# Patient Record
Sex: Female | Born: 1985 | Race: Black or African American | Hispanic: No | Marital: Single | Smoking: Never smoker
Health system: Southern US, Community
[De-identification: ages and names within clinical notes are randomized; demographics above are authoritative.]

## PROBLEM LIST (undated history)

## (undated) ENCOUNTER — Inpatient Hospital Stay (HOSPITAL_COMMUNITY): Payer: Self-pay

## (undated) DIAGNOSIS — D219 Benign neoplasm of connective and other soft tissue, unspecified: Secondary | ICD-10-CM

## (undated) DIAGNOSIS — R51 Headache: Secondary | ICD-10-CM

## (undated) DIAGNOSIS — J45909 Unspecified asthma, uncomplicated: Secondary | ICD-10-CM

## (undated) DIAGNOSIS — T8859XA Other complications of anesthesia, initial encounter: Secondary | ICD-10-CM

## (undated) DIAGNOSIS — F410 Panic disorder [episodic paroxysmal anxiety] without agoraphobia: Secondary | ICD-10-CM

## (undated) DIAGNOSIS — R519 Headache, unspecified: Secondary | ICD-10-CM

## (undated) DIAGNOSIS — O24419 Gestational diabetes mellitus in pregnancy, unspecified control: Secondary | ICD-10-CM

## (undated) HISTORY — DX: Headache: R51

## (undated) HISTORY — DX: Benign neoplasm of connective and other soft tissue, unspecified: D21.9

## (undated) HISTORY — DX: Gestational diabetes mellitus in pregnancy, unspecified control: O24.419

## (undated) HISTORY — PX: OVARY SURGERY: SHX727

## (undated) HISTORY — DX: Headache, unspecified: R51.9

## (undated) HISTORY — PX: OTHER SURGICAL HISTORY: SHX169

## (undated) HISTORY — DX: Other complications of anesthesia, initial encounter: T88.59XA

---
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
## 2005-03-28 ENCOUNTER — Ambulatory Visit: Payer: Self-pay | Admitting: Family Medicine

## 2007-03-15 ENCOUNTER — Emergency Department (HOSPITAL_COMMUNITY): Admission: EM | Admit: 2007-03-15 | Discharge: 2007-03-16 | Payer: Self-pay | Admitting: Emergency Medicine

## 2017-04-28 ENCOUNTER — Ambulatory Visit (HOSPITAL_COMMUNITY)
Admission: EM | Admit: 2017-04-28 | Discharge: 2017-04-28 | Disposition: A | Discharge status: Home / Self Care | Payer: 59 | Attending: Family Medicine | Admitting: Family Medicine

## 2017-04-28 ENCOUNTER — Encounter (HOSPITAL_COMMUNITY): Payer: Self-pay | Admitting: Emergency Medicine

## 2017-04-28 DIAGNOSIS — J40 Bronchitis, not specified as acute or chronic: Secondary | ICD-10-CM

## 2017-04-28 HISTORY — DX: Unspecified asthma, uncomplicated: J45.909

## 2017-04-28 MED ORDER — PREDNISONE 20 MG PO TABS: ORAL_TABLET | ORAL | 0 refills | Status: DC

## 2017-04-28 NOTE — Discharge Instructions (Signed)
You have bronchitis along with this viral infection.  If not improving in 3 days, please return

## 2017-04-28 NOTE — ED Provider Notes (Signed)
[facility]    CSN: [id number] Arrival date & time: 04/28/17  1318     History   Chief Complaint Chief Complaint  Patient presents with  . URI    HPI Sandra Krueger is a 31 y.o. female.   Pt reports URI symptoms x1 week.  In the last two days she has been having asthma issues, using her inhaler and nebulizer with very little relief.  Pt is concerned for bronchitis.  No fever.  Has had to sleep in recliner at night to breathe better.  Works IT consultant      Past Medical History:  Diagnosis Date  . Asthma     There are no active problems to display for this patient.   History reviewed. No pertinent surgical history.  OB History    No data available       Home Medications    Prior to Admission medications   Medication Sig Start Date End Date Taking? Authorizing Provider  albuterol (ACCUNEB) 1.25 MG/3ML nebulizer solution Take 1 ampule by nebulization 3 (three) times daily as needed for wheezing.   Yes [provider]  albuterol (PROVENTIL HFA;VENTOLIN HFA) 108 (90 Base) MCG/ACT inhaler Inhale 2 puffs into the lungs every 4 (four) hours as needed for wheezing or shortness of breath.   Yes [provider]  predniSONE (DELTASONE) 20 MG tablet Two daily with food 04/28/17   Robyn Haber, MD    Family History History reviewed. No pertinent family history.  Social History Social History  Substance Use Topics  . Smoking status: Never Smoker  . Smokeless tobacco: Never Used  . Alcohol use Yes     Allergies   Patient has no known allergies.   Review of Systems Review of Systems  Respiratory: Positive for cough, choking, shortness of breath and wheezing.   All other systems reviewed and are negative.    Physical Exam Triage Vital Signs ED Triage Vitals  Enc Vitals Group     BP 04/28/17 1419 125/73     Pulse Rate 04/28/17 1419 70     Resp --      Temp 04/28/17 1419 98.2 F (36.8 C)     Temp Source  04/28/17 1419 Oral     SpO2 04/28/17 1419 100 %     Weight --      Height --      Head Circumference --      Peak Flow --      Pain Score 04/28/17 1420 4     Pain Loc --      Pain Edu? --      Excl. in [place]? --    No data found.   Updated Vital Signs BP 125/73 (BP Location: Left Arm)   Pulse 70   Temp 98.2 F (36.8 C) (Oral)   LMP 04/12/2017   SpO2 100%    Physical Exam  Constitutional: She is oriented to person, place, and time. She appears well-developed and well-nourished.  HENT:  Right Ear: External ear normal.  Left Ear: External ear normal.  Mouth/Throat: Oropharynx is clear and moist.  Eyes: Pupils are equal, round, and reactive to light. Conjunctivae are normal.  Neck: Normal range of motion. Neck supple.  Pulmonary/Chest: Effort normal. She has rales.  Few bibasilar rales.  Musculoskeletal: Normal range of motion.  Neurological: She is alert and oriented to person, place, and time.  Skin: Skin is warm and dry.  Nursing note and vitals reviewed.  UC Treatments / Results  Labs (all labs ordered are listed, but only abnormal results are displayed) Labs Reviewed - No data to display  EKG  EKG Interpretation None       Radiology No results found.  Procedures Procedures (including critical care time)  Medications Ordered in UC Medications - No data to display   Initial Impression / Assessment and Plan / UC Course  I have reviewed the triage vital signs and the nursing notes.  Pertinent labs & imaging results that were available during my care of the patient were reviewed by me and considered in my medical decision making (see chart for details).     Final Clinical Impressions(s) / UC Diagnoses   Final diagnoses:  Bronchitis    New Prescriptions New Prescriptions   PREDNISONE (DELTASONE) 20 MG TABLET    Two daily with food     Controlled Substance Prescriptions [place] Controlled Substance Registry consulted? Not Applicable     Robyn Haber, MD 04/28/17 [phone]

## 2017-04-28 NOTE — ED Triage Notes (Signed)
Pt reports URI symptoms x1 week.  In the last two days she has been having asthma issues, using her inhaler and nebulizer with very little relief.  Pt is concerned for bronchitis.

## 2017-08-14 NOTE — L&D Delivery Note (Signed)
Delivery Note  First Stage: Labor onset: 1900 Augmentation: AROM Analgesia /Anesthesia intrapartum: IV sedation AROM at 0315  Second Stage: Complete dilation at 0512 Onset of pushing at 0512 FHR second stage Cat I  Involuntary pushing w/ ctx and unable to reduce anterior lip. Delivery of a viable female at 0515 w/ loose nuchal, head directed to maternal left and delivered through cord. Delivery performed by V.Saima Monterroso, SNM and attended by K. Brigitte Pulse, CNM. Terminal meconium. Cord double clamped after approx 2 min and cut by pt's mother. Cord blood sample collected    Third Stage: Placenta delivered Sandra Krueger S/C/I with 3 VC @ [phone] Placenta disposition: L&D Uterine tone firm / bleeding scant  Cervical laceration identified 2 11 o'clock. Hemostatic, no repair required.  Anesthesia for repair: N/A Repair N/A Est. Blood Loss (mL): 50 mL  Complications: None  Mom to postpartum.  Baby to Couplet care / Skin to Skin.  Newborn: Birth Weight: pending  Apgar Scores: 1-minute: 8                          5-minute: 9 Feeding planned: Breast Contraception: Sandra Krueger, SNM 06/29/2018 5:49 AM

## 2017-11-16 DIAGNOSIS — Z86018 Personal history of other benign neoplasm: Secondary | ICD-10-CM | POA: Insufficient documentation

## 2017-11-16 DIAGNOSIS — D259 Leiomyoma of uterus, unspecified: Secondary | ICD-10-CM | POA: Insufficient documentation

## 2017-11-16 DIAGNOSIS — O3410 Maternal care for benign tumor of corpus uteri, unspecified trimester: Secondary | ICD-10-CM | POA: Insufficient documentation

## 2017-11-16 DIAGNOSIS — Z6841 Body Mass Index (BMI) 40.0 and over, adult: Secondary | ICD-10-CM | POA: Insufficient documentation

## 2017-11-16 DIAGNOSIS — D251 Intramural leiomyoma of uterus: Secondary | ICD-10-CM | POA: Insufficient documentation

## 2017-12-14 LAB — OB RESULTS CONSOLE HEPATITIS B SURFACE ANTIGEN: Hepatitis B Surface Ag: NEGATIVE

## 2017-12-14 LAB — OB RESULTS CONSOLE ABO/RH: RH TYPE: POSITIVE

## 2017-12-14 LAB — OB RESULTS CONSOLE GC/CHLAMYDIA
Chlamydia: NEGATIVE
GC PROBE AMP, GENITAL: NEGATIVE

## 2017-12-14 LAB — OB RESULTS CONSOLE RUBELLA ANTIBODY, IGM: RUBELLA: NON-IMMUNE/NOT IMMUNE

## 2017-12-14 LAB — CYTOLOGY - PAP: Pap: NEGATIVE

## 2017-12-14 LAB — HEMOGLOBIN A1C
Hemoglobin A1C: 5.1
TSH: 2.22

## 2017-12-14 LAB — OB RESULTS CONSOLE ANTIBODY SCREEN: Antibody Screen: NEGATIVE

## 2017-12-14 LAB — OB RESULTS CONSOLE HGB/HCT, BLOOD
HEMATOCRIT: 37
HEMOGLOBIN: 11.6

## 2017-12-14 LAB — OB RESULTS CONSOLE RPR: RPR: NONREACTIVE

## 2017-12-14 LAB — OB RESULTS CONSOLE HIV ANTIBODY (ROUTINE TESTING): HIV: NONREACTIVE

## 2017-12-14 LAB — OB RESULTS CONSOLE PLATELET COUNT: PLATELETS: 308

## 2018-01-18 ENCOUNTER — Other Ambulatory Visit: Payer: Self-pay

## 2018-01-18 ENCOUNTER — Emergency Department (HOSPITAL_COMMUNITY)
Admission: EM | Admit: 2018-01-18 | Discharge: 2018-01-18 | Disposition: A | Discharge status: Home / Self Care | Payer: Medicaid Other | Attending: Emergency Medicine | Admitting: Emergency Medicine

## 2018-01-18 ENCOUNTER — Encounter (HOSPITAL_COMMUNITY): Payer: Self-pay | Admitting: Emergency Medicine

## 2018-01-18 DIAGNOSIS — Z3A16 16 weeks gestation of pregnancy: Secondary | ICD-10-CM | POA: Diagnosis not present

## 2018-01-18 DIAGNOSIS — O26892 Other specified pregnancy related conditions, second trimester: Secondary | ICD-10-CM

## 2018-01-18 DIAGNOSIS — R1084 Generalized abdominal pain: Secondary | ICD-10-CM | POA: Insufficient documentation

## 2018-01-18 DIAGNOSIS — R109 Unspecified abdominal pain: Secondary | ICD-10-CM

## 2018-01-18 NOTE — Progress Notes (Signed)
Call from [facility] ED for patient c/o contractions. Per notes from prenatal visit with [facility] system, patient is currently [redacted]w[redacted]d gestation.  Advised ED RN to doppler heartrate and [place] [phone] if consult needed.

## 2018-01-18 NOTE — Discharge Instructions (Addendum)
As discussed, your evaluation today has been largely reassuring.  But, it is important that you monitor your condition carefully, and do not hesitate to return to the ED if you develop new, or concerning changes in your condition. ? ?Otherwise, please follow-up with your physician for appropriate ongoing care. ? ?

## 2018-01-18 NOTE — ED Triage Notes (Signed)
Patient is [redacted] weeks pregnant. Patient is complaining that she is having contractions. The contractions started yesterday. The patient has not been timing the contractions.

## 2018-01-18 NOTE — ED Notes (Signed)
Per patients chart, she was seen at [facility] last week and was [redacted] weeks pregnant, rapid OB said to doppler her fetal heart tones and call the attending if needed

## 2018-01-18 NOTE — ED Provider Notes (Signed)
[place] DEPT Provider Note   CSN: [id number] Arrival date & time: 01/18/18  2131     History   Chief Complaint Chief Complaint  Patient presents with  . Abdominal Pain  . [redacted] weeks pregnant    HPI Sandra Krueger is a 32 y.o. female.  HPI Patient presents with concern of abdominal pain. She describes the pain as cramping, notes that is been present for about 2 days. It is different from other discomfort she has experienced this pregnancy, feels more like contraction, though she has no vaginal bleeding, discharge, fluid breakage. There is some nausea, similar to what she has been expensing during this pregnancy. Patient has prenatal care at another facility, has been in and out of the hospital for pregnancy checks, according to her. In discomfort, the pregnancies, however, going unremarkably. Patient is accompanied by her 75-year-old daughter. Initially the patient reports that she is [redacted] weeks pregnant, but on secondary interview she states that she is [redacted] weeks pregnant. Past Medical History:  Diagnosis Date  . Asthma     There are no active problems to display for this patient.   History reviewed. No pertinent surgical history.   OB History    Gravida  1   Para      Term      Preterm      AB      Living        SAB      TAB      Ectopic      Multiple      Live Births               Home Medications    Prior to Admission medications   Medication Sig Start Date End Date Taking? Authorizing Provider  albuterol (ACCUNEB) 1.25 MG/3ML nebulizer solution Take 1 ampule by nebulization 3 (three) times daily as needed for wheezing.    [provider]  albuterol (PROVENTIL HFA;VENTOLIN HFA) 108 (90 Base) MCG/ACT inhaler Inhale 2 puffs into the lungs every 4 (four) hours as needed for wheezing or shortness of breath.    [provider]  predniSONE (DELTASONE) 20 MG tablet Two daily with food 04/28/17    Robyn Haber, MD    Family History History reviewed. No pertinent family history.  Social History Social History   Tobacco Use  . Smoking status: Never Smoker  . Smokeless tobacco: Never Used  Substance Use Topics  . Alcohol use: Yes  . Drug use: No     Allergies   Patient has no known allergies.   Review of Systems Review of Systems  Constitutional:       Per HPI, otherwise negative  HENT:       Per HPI, otherwise negative  Respiratory:       Per HPI, otherwise negative  Cardiovascular:       Per HPI, otherwise negative  Gastrointestinal: Positive for abdominal pain, nausea and vomiting. Negative for diarrhea.  Endocrine:       Negative aside from HPI  Genitourinary:       Neg aside from HPI   Musculoskeletal:       Per HPI, otherwise negative  Skin: Negative.   Neurological: Negative for syncope.     Physical Exam Updated Vital Signs BP 113/86 (BP Location: Left Arm)   Pulse (!) 101   Temp 98.1 F (36.7 C) (Oral)   Resp 18   Ht 5\' 4"  (1.626 m)   Wt 112.9  kg (249 lb)   LMP 04/12/2017   SpO2 98%   BMI 42.74 kg/m   Physical Exam  Constitutional: She is oriented to person, place, and time. She appears well-developed and well-nourished. No distress.  HENT:  Head: Normocephalic and atraumatic.  Eyes: Conjunctivae and EOM are normal.  Cardiovascular: Normal rate and regular rhythm.  Pulmonary/Chest: Effort normal and breath sounds normal. No stridor. No respiratory distress.  Abdominal: She exhibits no distension.  No distention, gravid abdomen, minimal tenderness to palpation anywhere, more on the far lateral right and left sides  Musculoskeletal: She exhibits no edema.  Neurological: She is alert and oriented to person, place, and time. No cranial nerve deficit.  Skin: Skin is warm and dry.  Psychiatric: She has a normal mood and affect.  Nursing note and vitals reviewed.    ED Treatments / Results   Procedures Procedures (including  critical care time)  Fetal heart tone: 150s  Initial Impression / Assessment and Plan / ED Course  I have reviewed the triage vital signs and the nursing notes.  Pertinent labs & imaging results that were available during my care of the patient were reviewed by me and considered in my medical decision making (see chart for details).    Patient's vital signs unremarkable, abdomen soft, no vaginal bleeding, no discharge, no drainage. I [facility], with information about recent OB visit as below:  1. ROB: O pos, Rub imm. ?Aneuploidy/carrier screening. Uncertain paternity 2. Obesity: CMP, TSH, HgbA1c nl 3. Fibroids: posterior subserosal 5.5 x 5.6 x 4.9cm, fundal intramural 1.2 x 1.3 x 1.1cm 4. Hx myomectomy: robotic, 2015, [place], [place]. Records requested 12/14/17      11:09 PM On repeat exam patient is awake and alert, in no distress. Fetal heart tones 150s, given the absence of vaginal bleeding, discharge, low suspicion for premature rupture of membranes, no evidence for distress, no evidence for abdominal infection, with a non-peritoneal abdomen, no fever, unremarkable vital signs. We discussed return precautions, need to follow-up with her obstetrician, the patient was discharged in stable condition.  Final Clinical Impressions(s) / ED Diagnoses  Abdominal pain in pregnancy   Carmin Muskrat, MD 01/18/18 2314

## 2018-03-22 LAB — GLUCOSE, 1 HOUR: GLUCOSE 1 HOUR: 156

## 2018-04-03 LAB — GLUCOSE, 3 HOUR GESTATIONAL

## 2018-04-04 ENCOUNTER — Emergency Department (HOSPITAL_COMMUNITY)
Admission: EM | Admit: 2018-04-04 | Discharge: 2018-04-04 | Disposition: A | Discharge status: Home / Self Care | Payer: Medicaid Other | Attending: Emergency Medicine | Admitting: Emergency Medicine

## 2018-04-04 ENCOUNTER — Other Ambulatory Visit: Payer: Self-pay

## 2018-04-04 ENCOUNTER — Encounter (HOSPITAL_COMMUNITY): Payer: Self-pay

## 2018-04-04 DIAGNOSIS — O479 False labor, unspecified: Secondary | ICD-10-CM

## 2018-04-04 DIAGNOSIS — J45909 Unspecified asthma, uncomplicated: Secondary | ICD-10-CM | POA: Insufficient documentation

## 2018-04-04 DIAGNOSIS — Z3A27 27 weeks gestation of pregnancy: Secondary | ICD-10-CM | POA: Diagnosis not present

## 2018-04-04 DIAGNOSIS — O4702 False labor before 37 completed weeks of gestation, second trimester: Secondary | ICD-10-CM | POA: Insufficient documentation

## 2018-04-04 DIAGNOSIS — O9952 Diseases of the respiratory system complicating childbirth: Secondary | ICD-10-CM | POA: Insufficient documentation

## 2018-04-04 DIAGNOSIS — O9989 Other specified diseases and conditions complicating pregnancy, childbirth and the puerperium: Secondary | ICD-10-CM | POA: Diagnosis present

## 2018-04-04 LAB — BASIC METABOLIC PANEL
ANION GAP: 7 (ref 5–15)
BUN: 8 mg/dL (ref 6–20)
CALCIUM: 8.5 mg/dL — AB (ref 8.9–10.3)
CHLORIDE: 104 mmol/L (ref 98–111)
CO2: 23 mmol/L (ref 22–32)
Creatinine, Ser: 0.43 mg/dL — ABNORMAL LOW (ref 0.44–1.00)
GFR calc non Af Amer: >60 mL/min (ref >60)
Glucose, Bld: 95 mg/dL (ref 70–99)
Potassium: 3.3 mmol/L — ABNORMAL LOW (ref 3.5–5.1)
Sodium: 134 mmol/L — ABNORMAL LOW (ref 135–145)

## 2018-04-04 LAB — CBC
HEMATOCRIT: 32.8 % — AB (ref 36.0–46.0)
HEMOGLOBIN: 10.4 g/dL — AB (ref 12.0–15.0)
MCH: 25.7 pg — ABNORMAL LOW (ref 26.0–34.0)
MCHC: 31.7 g/dL (ref 30.0–36.0)
MCV: 81.2 fL (ref 78.0–100.0)
Platelets: 252 10*3/uL (ref 150–400)
RBC: 4.04 MIL/uL (ref 3.87–5.11)
RDW: 14.5 % (ref 11.5–15.5)
WBC: 7.5 10*3/uL (ref 4.0–10.5)

## 2018-04-04 MED ORDER — SODIUM CHLORIDE 0.9 % IV BOLUS
1000.0000 mL | Freq: Once | INTRAVENOUS | Status: AC
  Administered 2018-04-04: 1000 mL via INTRAVENOUS

## 2018-04-04 NOTE — ED Notes (Signed)
Patient placed on fetal heart monitor. Fetal heart rate of 152.

## 2018-04-04 NOTE — ED Notes (Signed)
Rapid OB nurse called. En route.

## 2018-04-04 NOTE — Progress Notes (Signed)
Reassuring NST for 27 3/[redacted] weeks gestation.  ERMD updated on POC.  In agreement.  Cleared from [facility] service.

## 2018-04-04 NOTE — ED Provider Notes (Signed)
[facility] DEPT Provider Note   CSN: [id number] Arrival date & time: 04/04/18  1231     History   Chief Complaint Chief Complaint  Patient presents with  . Contractions    HPI Sandra Krueger is a 32 y.o. female.  HPI 32 year old G2, P1 currently [redacted] weeks gestation presents the emergency department with occasional lower abdominal tightness since this morning.  No vaginal bleeding.  No loss of fluid.  She continues to feel the baby move.  She came to the emergency department for further evaluation.  She does not have a local obstetrician.  She currently has her care in [facility] but has relocated to [place].  No urinary complaints.  No fevers.   Past Medical History:  Diagnosis Date  . Asthma     There are no active problems to display for this patient.   No past surgical history on file.   OB History    Gravida  1   Para      Term      Preterm      AB      Living        SAB      TAB      Ectopic      Multiple      Live Births               Home Medications    Prior to Admission medications   Medication Sig Start Date End Date Taking? Authorizing Provider  acetaminophen (TYLENOL) 500 MG tablet Take 500 mg by mouth every 6 (six) hours as needed for mild pain.   Yes [provider]  albuterol (PROVENTIL HFA;VENTOLIN HFA) 108 (90 Base) MCG/ACT inhaler Inhale 2 puffs into the lungs every 4 (four) hours as needed for wheezing or shortness of breath.   Yes [provider]  prenatal vitamin w/FE, FA (PRENATAL 1 + 1) 27-1 MG TABS tablet Take 1 tablet by mouth daily at 12 noon.   Yes [provider]  EPINEPHrine (EPIPEN 2-PAK) 0.3 mg/0.3 mL IJ SOAJ injection Inject 0.3 mg into the muscle once.    [provider]  predniSONE (DELTASONE) 20 MG tablet Two daily with food Patient not taking: Reported on 04/04/2018 04/28/17   Robyn Haber, MD  ranitidine (ZANTAC) 150 MG  tablet Take 150 mg by mouth 2 (two) times daily as needed for heartburn.  01/11/18   [provider]    Family History No family history on file.  Social History Social History   Tobacco Use  . Smoking status: Never Smoker  . Smokeless tobacco: Never Used  Substance Use Topics  . Alcohol use: Yes  . Drug use: No     Allergies   Other   Review of Systems Review of Systems  All other systems reviewed and are negative.    Physical Exam Updated Vital Signs Pulse 90   Resp 18   LMP 10/02/2016 (Approximate)   SpO2 100%   Physical Exam  Constitutional: She is oriented to person, place, and time. She appears well-developed and well-nourished.  HENT:  Head: Normocephalic.  Eyes: EOM are normal.  Neck: Normal range of motion.  Pulmonary/Chest: Effort normal and breath sounds normal.  Abdominal: Soft. She exhibits no distension. There is no tenderness.  Gravid uterus consistent with dates  Musculoskeletal: Normal range of motion.  Neurological: She is alert and oriented to person, place, and time.  Psychiatric: She has a normal mood  and affect.  Nursing note and vitals reviewed.    ED Treatments / Results  Labs (all labs ordered are listed, but only abnormal results are displayed) Labs Reviewed  CBC - Abnormal; Notable for the following components:      Result Value   Hemoglobin 10.4 (*)    HCT 32.8 (*)    MCH 25.7 (*)    All other components within normal limits  BASIC METABOLIC PANEL - Abnormal; Notable for the following components:   Sodium 134 (*)    Potassium 3.3 (*)    Creatinine, Ser 0.43 (*)    Calcium 8.5 (*)    All other components within normal limits  URINALYSIS, ROUTINE W REFLEX MICROSCOPIC    EKG None  Radiology No results found.  Procedures Procedures (including critical care time)  Medications Ordered in ED Medications  sodium chloride 0.9 % bolus 1,000 mL (1,000 mLs Intravenous New Bag/Given 04/04/18 1319)     Initial  Impression / Assessment and Plan / ED Course  I have reviewed the triage vital signs and the nursing notes.  Pertinent labs & imaging results that were available during my care of the patient were reviewed by me and considered in my medical decision making (see chart for details).     Medically clear at this time.  Patient seen and evaluated by rapid OB and she is felt to be cleared from an OB standpoint as well.  She had no regular contractions on the monitor.  Fetal heart rate is reassuring.  Outpatient OB follow-up.  Patient encouraged to continue her fluid intake and return to [facility] for any new or worsening symptoms  Final Clinical Impressions(s) / ED Diagnoses   Final diagnoses:  Braxton Hick's contraction    ED Discharge Orders    None       Jola Schmidt, MD 04/04/18 1437

## 2018-04-04 NOTE — Progress Notes (Addendum)
G2P1 at 27 3/7 weeks reports to [facility] with c/o cxn pain since last night.  The "pain got so bad she couldn't cook".  No bleeding or leaking at this time.  Pain 5/10 in lower abdomen.  IV started and fluids given at this time per ER.  Blood work drawn.  Pt receives [facility] in [place], [place] where she is scheduled for a primary C/S at 37 weeks for hx of myomectomy and recurrent fibroids.  Had successful SVE 9 years ago.  Dr Rip Harbour updated on pt status and complaints.  D/C home with reassuring NST for a [redacted] week gestation pregnancy.  Has appt with her normal OB in [place] on the 9/9.  Call OB doctor when she gets home to see if they need to see her sooner than the 9th.

## 2018-04-04 NOTE — ED Triage Notes (Signed)
She tells me she is ~[redacted] weeks gestation, Grav II Para I and has been having occasional low abd. Contractions and "tightening" since this  Morning. She is ambulatory and in no distress.

## 2018-04-28 ENCOUNTER — Encounter (HOSPITAL_COMMUNITY): Payer: Self-pay

## 2018-04-28 ENCOUNTER — Inpatient Hospital Stay (HOSPITAL_COMMUNITY)
Admission: AD | Admit: 2018-04-28 | Discharge: 2018-04-28 | Disposition: A | Discharge status: Home / Self Care | Payer: Medicaid Other | Source: Ambulatory Visit | Attending: Family Medicine | Admitting: Family Medicine

## 2018-04-28 DIAGNOSIS — O4703 False labor before 37 completed weeks of gestation, third trimester: Secondary | ICD-10-CM

## 2018-04-28 DIAGNOSIS — Z79899 Other long term (current) drug therapy: Secondary | ICD-10-CM | POA: Insufficient documentation

## 2018-04-28 DIAGNOSIS — J45909 Unspecified asthma, uncomplicated: Secondary | ICD-10-CM | POA: Insufficient documentation

## 2018-04-28 DIAGNOSIS — Z3A30 30 weeks gestation of pregnancy: Secondary | ICD-10-CM | POA: Diagnosis not present

## 2018-04-28 DIAGNOSIS — O99513 Diseases of the respiratory system complicating pregnancy, third trimester: Secondary | ICD-10-CM | POA: Insufficient documentation

## 2018-04-28 DIAGNOSIS — O479 False labor, unspecified: Secondary | ICD-10-CM

## 2018-04-28 HISTORY — DX: Benign neoplasm of connective and other soft tissue, unspecified: D21.9

## 2018-04-28 LAB — URINALYSIS, ROUTINE W REFLEX MICROSCOPIC
BILIRUBIN URINE: NEGATIVE
Glucose, UA: NEGATIVE mg/dL
Hgb urine dipstick: NEGATIVE
KETONES UR: 80 mg/dL — AB
Nitrite: NEGATIVE
Protein, ur: NEGATIVE mg/dL
Specific Gravity, Urine: 1.015 (ref 1.005–1.030)
pH: 6 (ref 5.0–8.0)

## 2018-04-28 NOTE — MAU Note (Signed)
Pt having ctx since 2am this morning, waited it out but they didn't improve. Water is not helping. Pain 6/10. No bleeding, no LOF +FM. Diarrhea a couple of days ago, not today. Pt has fibroids.

## 2018-04-28 NOTE — Discharge Instructions (Signed)

## 2018-04-28 NOTE — MAU Provider Note (Signed)
History     CSN: [id number]  Arrival date and time: 04/28/18 1558   First Provider Initiated Contact with Patient 04/28/18 1651      Chief Complaint  Patient presents with  . Contractions   Sandra Krueger is a 32 y.o. G2P1 at [redacted]w[redacted]d who present to MAU with complaints of contractions. She reports contractions has been occurring since 2am this morning. Reports contractions were every 2 minutes but have spaced out since arrival to MAU.  She rates the pain 6 out of 10.  Has not taking any medication for pain. She denies having to breath through contractions. Receives prenatal care in [place] but has recently moved to [place]. She denies vaginal bleeding, vaginal discharge, or LOF. Reports +FM. Pregnancy is complicated by fibroids and LGA. She denies hx of PTD or PTL.   OB History    Gravida  2   Para  1   Term  1   Preterm      AB      Living  1     SAB      TAB      Ectopic      Multiple      Live Births  1           Past Medical History:  Diagnosis Date  . Asthma   . Fibroids     Past Surgical History:  Procedure Laterality Date  . OTHER SURGICAL HISTORY     fibroids removed    History reviewed. No pertinent family history.  Social History   Tobacco Use  . Smoking status: Never Smoker  . Smokeless tobacco: Never Used  Substance Use Topics  . Alcohol use: Not Currently  . Drug use: No    Allergies:  Allergies  Allergen Reactions  . Other Anaphylaxis    All nuts    Medications Prior to Admission  Medication Sig Dispense Refill Last Dose  . acetaminophen (TYLENOL) 500 MG tablet Take 500 mg by mouth every 6 (six) hours as needed for mild pain.   Past Week at Unknown time  . albuterol (PROVENTIL HFA;VENTOLIN HFA) 108 (90 Base) MCG/ACT inhaler Inhale 2 puffs into the lungs every 4 (four) hours as needed for wheezing or shortness of breath.   Past Week at Unknown time  . EPINEPHrine (EPIPEN 2-PAK) 0.3 mg/0.3 mL IJ SOAJ injection Inject 0.3 mg  into the muscle once.   long time ago  . predniSONE (DELTASONE) 20 MG tablet Two daily with food (Patient not taking: Reported on 04/04/2018) 10 tablet 0 Not Taking at Unknown time  . prenatal vitamin w/FE, FA (PRENATAL 1 + 1) 27-1 MG TABS tablet Take 1 tablet by mouth daily at 12 noon.   04/03/2018 at Unknown time  . ranitidine (ZANTAC) 150 MG tablet Take 150 mg by mouth 2 (two) times daily as needed for heartburn.   6 Past Week at Unknown time    Review of Systems  Constitutional: Negative.   Respiratory: Negative.   Cardiovascular: Negative.   Gastrointestinal: Positive for abdominal pain.  Genitourinary: Negative.   Neurological: Negative.    Physical Exam   Blood pressure 125/77, pulse 97, temperature 98.3 F (36.8 C), temperature source Oral, resp. rate 16, weight 108.4 kg, SpO2 98 %.  Physical Exam  Nursing note and vitals reviewed. Constitutional: She is oriented to person, place, and time. She appears well-developed and well-nourished. No distress.  HENT:  Head: Normocephalic.  Cardiovascular: Normal rate, regular rhythm and normal heart sounds.  Respiratory: Effort normal and breath sounds normal. No respiratory distress. She has no wheezes.  GI: Soft.  Gravid, no contractions palpated  Musculoskeletal: Normal range of motion. She exhibits no edema.  Neurological: She is alert and oriented to person, place, and time.  Skin: Skin is warm.  Psychiatric: She has a normal mood and affect. Her behavior is normal. Thought content normal.   Dilation: Fingertip Effacement (%): Thick Cervical Position: Posterior Exam by:: V Rosea Dory CNM  FHR: 135/ moderate/ +accels/ no decels  [place]: no UC  MAU Course  Procedures  MDM Cervical exam  NST- reactive   No uterine contractions on monitor or by palpations. Cervical exam unremarkable. Educated and discussed reasons to return to MAU. Discussed importance of transferring care to [facility] if she plans to deliver in [place].  Message sent to [facility] for appointment to be scheduled to initiate care in [place]. Pt stable at time of discharge.   Assessment and Plan   1. Braxton Hicks contractions   2. [redacted] weeks gestation of pregnancy    Discharge home  Transfer care to [facility] and bring records to appointment  Return to MAU as needed  Preterm labor precautions and fetal kick counts   Follow-up [place] for [facility]. Schedule an appointment as soon as possible for a visit.   Specialty:  Obstetrics and Gynecology Contact information: [place] [phone]          Allergies as of 04/28/2018      Reactions   Other Anaphylaxis   All nuts      Medication List    TAKE these medications   acetaminophen 500 MG tablet Commonly known as:  TYLENOL Take 500 mg by mouth every 6 (six) hours as needed for mild pain.   albuterol 108 (90 Base) MCG/ACT inhaler Commonly known as:  PROVENTIL HFA;VENTOLIN HFA Inhale 2 puffs into the lungs every 4 (four) hours as needed for wheezing or shortness of breath.   EPIPEN 2-PAK 0.3 mg/0.3 mL Soaj injection Generic drug:  EPINEPHrine Inject 0.3 mg into the muscle once.   predniSONE 20 MG tablet Commonly known as:  DELTASONE Two daily with food   prenatal vitamin w/FE, FA 27-1 MG Tabs tablet Take 1 tablet by mouth daily at 12 noon.   ranitidine 150 MG tablet Commonly known as:  ZANTAC Take 150 mg by mouth 2 (two) times daily as needed for heartburn.       Lajean Manes CNM 04/28/2018, 5:42 PM

## 2018-04-29 ENCOUNTER — Encounter: Payer: Medicaid Other | Admitting: Advanced Practice Midwife

## 2018-04-29 ENCOUNTER — Telehealth: Payer: Self-pay | Admitting: Advanced Practice Midwife

## 2018-04-29 NOTE — Telephone Encounter (Signed)
Called patient about appointment. Was not able to leave a message.

## 2018-05-01 ENCOUNTER — Ambulatory Visit (INDEPENDENT_AMBULATORY_CARE_PROVIDER_SITE_OTHER): Payer: Medicaid Other | Admitting: Obstetrics & Gynecology

## 2018-05-01 ENCOUNTER — Encounter: Payer: Self-pay | Admitting: Obstetrics & Gynecology

## 2018-05-01 ENCOUNTER — Ambulatory Visit: Payer: Self-pay | Admitting: Clinical

## 2018-05-01 VITALS — BP 121/77 | HR 97 | Wt 238.0 lb

## 2018-05-01 DIAGNOSIS — Z3480 Encounter for supervision of other normal pregnancy, unspecified trimester: Secondary | ICD-10-CM | POA: Insufficient documentation

## 2018-05-01 NOTE — BH Specialist Note (Signed)
[facility] Initial Visit  error

## 2018-05-01 NOTE — Progress Notes (Signed)
This pleasant G2P1 is here for follow up after going to the MAU recently for preterm contractions. She receives routine and regular OB care in [place] and in fact, has an appt on Monday. She is staying in [place] with her mom but wants to deliver in [place]. She has a follow up ultrasound next Monday in [place].   I have suggested that she keep her appts as scheduled in [place]. I recommended that she sign a ROI for her records should she need emergency care here in [place].

## 2018-05-16 ENCOUNTER — Encounter: Payer: Self-pay | Admitting: *Deleted

## 2018-05-29 ENCOUNTER — Other Ambulatory Visit: Payer: Self-pay

## 2018-05-29 ENCOUNTER — Encounter (HOSPITAL_COMMUNITY): Payer: Self-pay

## 2018-05-29 ENCOUNTER — Inpatient Hospital Stay (HOSPITAL_COMMUNITY)
Admission: AD | Admit: 2018-05-29 | Discharge: 2018-05-29 | Disposition: A | Discharge status: Home / Self Care | Payer: Medicaid Other | Source: Ambulatory Visit | Attending: Obstetrics and Gynecology | Admitting: Obstetrics and Gynecology

## 2018-05-29 DIAGNOSIS — R109 Unspecified abdominal pain: Secondary | ICD-10-CM | POA: Diagnosis present

## 2018-05-29 DIAGNOSIS — N898 Other specified noninflammatory disorders of vagina: Secondary | ICD-10-CM

## 2018-05-29 DIAGNOSIS — Z3A35 35 weeks gestation of pregnancy: Secondary | ICD-10-CM | POA: Insufficient documentation

## 2018-05-29 DIAGNOSIS — O26893 Other specified pregnancy related conditions, third trimester: Secondary | ICD-10-CM | POA: Diagnosis present

## 2018-05-29 DIAGNOSIS — O26899 Other specified pregnancy related conditions, unspecified trimester: Secondary | ICD-10-CM

## 2018-05-29 DIAGNOSIS — N939 Abnormal uterine and vaginal bleeding, unspecified: Secondary | ICD-10-CM | POA: Diagnosis present

## 2018-05-29 HISTORY — DX: Panic disorder (episodic paroxysmal anxiety): F41.0

## 2018-05-29 LAB — URINALYSIS, ROUTINE W REFLEX MICROSCOPIC
Bilirubin Urine: NEGATIVE
GLUCOSE, UA: NEGATIVE mg/dL
Ketones, ur: 20 mg/dL — AB
Nitrite: NEGATIVE
PROTEIN: 30 mg/dL — AB
Specific Gravity, Urine: 1.016 (ref 1.005–1.030)
pH: 6 (ref 5.0–8.0)

## 2018-05-29 LAB — WET PREP, GENITAL
CLUE CELLS WET PREP: NONE SEEN
Sperm: NONE SEEN
TRICH WET PREP: NONE SEEN
Yeast Wet Prep HPF POC: NONE SEEN

## 2018-05-29 NOTE — MAU Note (Signed)
Woke up this morning , had some spotting- bright red.  Brownish red later when checked.  No placental issues.  Was scheduled for c/s on 11/15, due to fibroids.  Been having pain off and on for about an hour, cramps-when balls up. Last OB appt was yesterday in [place]

## 2018-05-29 NOTE — MAU Provider Note (Signed)
History     CSN: [id number]  Arrival date and time: 05/29/18 1044   First Provider Initiated Contact with Patient 05/29/18 1240      Chief Complaint  Patient presents with  . Vaginal Bleeding  . Abdominal Pain   HPI   Ms.Sandra Krueger is a 32 y.o. female G2P1001 @ [redacted]w[redacted]d here in MAU with complaints of brown vaginal discharge that she first noticed around 0630 today. She saw it twice. She denies active bleeding. No recent intercourse. She is receiving her care in [place]. She has had off and on contractions which is not a new problem. She is schedule for a c-section on 11/8. She is having a C-section due to fibroids.   OB History    Gravida  2   Para  1   Term  1   Preterm      AB      Living  1     SAB      TAB      Ectopic      Multiple      Live Births  1           Past Medical History:  Diagnosis Date  . Asthma   . Fibroids   . Headache   . Panic attack    last episode approx two months ago per pt    Past Surgical History:  Procedure Laterality Date  . OTHER SURGICAL HISTORY     fibroids removed  . OVARY SURGERY     cyst removed from ? right ovary    Family History  Problem Relation Age of Onset  . Hypertension Mother     Social History   Tobacco Use  . Smoking status: Never Smoker  . Smokeless tobacco: Never Used  Substance Use Topics  . Alcohol use: Not Currently  . Drug use: Not Currently    Types: Marijuana    Allergies:  Allergies  Allergen Reactions  . Bee Venom Anaphylaxis  . Other Anaphylaxis    All nuts    Medications Prior to Admission  Medication Sig Dispense Refill Last Dose  . acetaminophen (TYLENOL) 500 MG tablet Take 500 mg by mouth every 6 (six) hours as needed for mild pain.   Taking  . albuterol (PROVENTIL HFA;VENTOLIN HFA) 108 (90 Base) MCG/ACT inhaler Inhale 2 puffs into the lungs every 4 (four) hours as needed for wheezing or shortness of breath.   Taking  . EPINEPHrine (EPIPEN 2-PAK) 0.3 mg/0.3 mL  IJ SOAJ injection Inject 0.3 mg into the muscle once.   Taking  . prenatal vitamin w/FE, FA (PRENATAL 1 + 1) 27-1 MG TABS tablet Take 1 tablet by mouth daily at 12 noon.   Taking   Results for orders placed or performed during the hospital encounter of 05/29/18 (from the past 48 hour(s))  Urinalysis, Routine w reflex microscopic     Status: Abnormal   Collection Time: 05/29/18 11:41 AM  Result Value Ref Range   Color, Urine YELLOW YELLOW   APPearance CLOUDY (A) CLEAR   Specific Gravity, Urine 1.016 1.005 - 1.030   pH 6.0 5.0 - 8.0   Glucose, UA NEGATIVE NEGATIVE mg/dL   Hgb urine dipstick SMALL (A) NEGATIVE   Bilirubin Urine NEGATIVE NEGATIVE   Ketones, ur 20 (A) NEGATIVE mg/dL   Protein, ur 30 (A) NEGATIVE mg/dL   Nitrite NEGATIVE NEGATIVE   Leukocytes, UA TRACE (A) NEGATIVE   RBC / HPF 0-5 0 - 5 RBC/hpf  WBC, UA 0-5 0 - 5 WBC/hpf   Bacteria, UA MANY (A) NONE SEEN   Squamous Epithelial / LPF 21-50 0 - 5   Mucus PRESENT     Comment: Performed at [facility], [address], [place], [place] [address]  Wet prep, genital     Status: Abnormal   Collection Time: 05/29/18  1:31 PM  Result Value Ref Range   Yeast Wet Prep HPF POC NONE SEEN NONE SEEN   Trich, Wet Prep NONE SEEN NONE SEEN   Clue Cells Wet Prep HPF POC NONE SEEN NONE SEEN   WBC, Wet Prep HPF POC FEW (A) NONE SEEN    Comment: MANY BACTERIA SEEN   Sperm NONE SEEN     Comment: Performed at [facility], [address] [place],  [address]    Review of Systems  Constitutional: Negative for fever.  Gastrointestinal: Positive for abdominal pain. Negative for nausea and vomiting.  Genitourinary: Positive for vaginal bleeding and vaginal discharge. Negative for dysuria.   Physical Exam   Blood pressure 117/75, pulse (!) 102, temperature 98 F (36.7 C), temperature source Oral, resp. rate 18, weight 106.5 kg, SpO2 99 %.  Physical Exam  Constitutional: She is oriented to person, place, and time. She  appears well-developed and well-nourished. No distress.  HENT:  Head: Normocephalic.  Eyes: Pupils are equal, round, and reactive to light.  Neck: Neck supple.  Genitourinary:  Genitourinary Comments: Vagina - Small amount of white vaginal discharge, no odor  Scant amount of pink blood noted on vaginal wall. No active bleeding. Noticed it only when speculum was removed.  No blood at cervix.  Cervix - No contact bleeding, no active bleeding  Bimanual exam: Cervix closed GC/Chlam, wet prep done Chaperone present for exam.   Musculoskeletal: Normal range of motion.  Neurological: She is alert and oriented to person, place, and time.  Skin: Skin is warm. She is not diaphoretic.  Psychiatric: Her behavior is normal.   Fetal Tracing: Baseline: 130 bpm Variability: moderate  Accelerations: 15x15 Decelerations: None Toco: None  MAU Course  Procedures  None  MDM  O positive blood type  Wet prep  UA Urine culture pending   Assessment and Plan   A:  1. Vaginal discharge during pregnancy, antepartum   2. [redacted] weeks gestation of pregnancy     P:  Normal exam today, Dc home in stable condition Return to MAU if symptoms worsen F/u with ob as scheduled or sooner if needed  Noni Saupe I, NP 05/29/2018 5:22 PM

## 2018-05-29 NOTE — Discharge Instructions (Signed)

## 2018-05-30 LAB — CULTURE, OB URINE: SPECIAL REQUESTS: NORMAL

## 2018-06-06 LAB — OB RESULTS CONSOLE GBS: STREP GROUP B AG: NEGATIVE

## 2018-06-11 ENCOUNTER — Inpatient Hospital Stay (HOSPITAL_COMMUNITY)
Admission: AD | Admit: 2018-06-11 | Discharge: 2018-06-11 | Disposition: A | Discharge status: Home / Self Care | Payer: Medicaid Other | Source: Ambulatory Visit | Attending: Family Medicine | Admitting: Family Medicine

## 2018-06-11 ENCOUNTER — Other Ambulatory Visit: Payer: Self-pay

## 2018-06-11 DIAGNOSIS — Z3490 Encounter for supervision of normal pregnancy, unspecified, unspecified trimester: Secondary | ICD-10-CM | POA: Diagnosis present

## 2018-06-11 DIAGNOSIS — Z3A00 Weeks of gestation of pregnancy not specified: Secondary | ICD-10-CM | POA: Insufficient documentation

## 2018-06-11 DIAGNOSIS — Z3A37 37 weeks gestation of pregnancy: Secondary | ICD-10-CM

## 2018-06-11 DIAGNOSIS — Z3480 Encounter for supervision of other normal pregnancy, unspecified trimester: Secondary | ICD-10-CM

## 2018-06-11 DIAGNOSIS — O471 False labor at or after 37 completed weeks of gestation: Secondary | ICD-10-CM

## 2018-06-11 LAB — URINALYSIS, ROUTINE W REFLEX MICROSCOPIC
BILIRUBIN URINE: NEGATIVE
Glucose, UA: NEGATIVE mg/dL
Hgb urine dipstick: NEGATIVE
Ketones, ur: NEGATIVE mg/dL
Nitrite: NEGATIVE
PH: 7 (ref 5.0–8.0)
Protein, ur: NEGATIVE mg/dL
SPECIFIC GRAVITY, URINE: 1.006 (ref 1.005–1.030)

## 2018-06-11 NOTE — MAU Note (Signed)
Pt here with c/o back pain and pressure. Denies any bleeding or leaking. Reports good fetal movement.

## 2018-06-11 NOTE — MAU Note (Addendum)
Dr Rexene Edison notified of VE, FHR and no contractions history given orders received.

## 2018-06-11 NOTE — Discharge Instructions (Signed)
Braxton Hicks Contractions °Contractions of the uterus can occur throughout pregnancy, but they are not always a sign that you are in labor. You may have practice contractions called Braxton Hicks contractions. These false labor contractions are sometimes confused with true labor. °What are Braxton Hicks contractions? °Braxton Hicks contractions are tightening movements that occur in the muscles of the uterus before labor. Unlike true labor contractions, these contractions do not result in opening (dilation) and thinning of the cervix. Toward the end of pregnancy (32-34 weeks), Braxton Hicks contractions can happen more often and may become stronger. These contractions are sometimes difficult to tell apart from true labor because they can be very uncomfortable. You should not feel embarrassed if you go to the hospital with false labor. °Sometimes, the only way to tell if you are in true labor is for your health care provider to look for changes in the cervix. The health care provider will do a physical exam and may monitor your contractions. If you are not in true labor, the exam should show that your cervix is not dilating and your water has not broken. °If there are other health problems associated with your pregnancy, it is completely safe for you to be sent home with false labor. You may continue to have Braxton Hicks contractions until you go into true labor. °How to tell the difference between true labor and false labor °True labor °· Contractions last 30-70 seconds. °· Contractions become very regular. °· Discomfort is usually felt in the top of the uterus, and it spreads to the lower abdomen and low back. °· Contractions do not go away with walking. °· Contractions usually become more intense and increase in frequency. °· The cervix dilates and gets thinner. °False labor °· Contractions are usually shorter and not as strong as true labor contractions. °· Contractions are usually irregular. °· Contractions  are often felt in the front of the lower abdomen and in the groin. °· Contractions may go away when you walk around or change positions while lying down. °· Contractions get weaker and are shorter-lasting as time goes on. °· The cervix usually does not dilate or become thin. °Follow these instructions at home: °· Take over-the-counter and prescription medicines only as told by your health care provider. °· Keep up with your usual exercises and follow other instructions from your health care provider. °· Eat and drink lightly if you think you are going into labor. °· If Braxton Hicks contractions are making you uncomfortable: °? Change your position from lying down or resting to walking, or change from walking to resting. °? Sit and rest in a tub of warm water. °? Drink enough fluid to keep your urine pale yellow. Dehydration may cause these contractions. °? Do slow and deep breathing several times an hour. °· Keep all follow-up prenatal visits as told by your health care provider. This is important. °Contact a health care provider if: °· You have a fever. °· You have continuous pain in your abdomen. °Get help right away if: °· Your contractions become stronger, more regular, and closer together. °· You have fluid leaking or gushing from your vagina. °· You pass blood-tinged mucus (bloody show). °· You have bleeding from your vagina. °· You have low back pain that you never had before. °· You feel your baby’s head pushing down and causing pelvic pressure. °· Your baby is not moving inside you as much as it used to. °Summary °· Contractions that occur before labor are called Braxton   Hicks contractions, false labor, or practice contractions. °· Braxton Hicks contractions are usually shorter, weaker, farther apart, and less regular than true labor contractions. True labor contractions usually become progressively stronger and regular and they become more frequent. °· Manage discomfort from Braxton Hicks contractions by  changing position, resting in a warm bath, drinking plenty of water, or practicing deep breathing. °This information is not intended to replace advice given to you by your health care provider. Make sure you discuss any questions you have with your health care provider. °Document Released: 12/14/2016 Document Revised: 12/14/2016 Document Reviewed: 12/14/2016 °Elsevier Interactive Patient Education © 2018 Elsevier Inc. ° °

## 2018-06-18 ENCOUNTER — Inpatient Hospital Stay (HOSPITAL_COMMUNITY)
Admission: AD | Admit: 2018-06-18 | Discharge: 2018-06-18 | Disposition: A | Discharge status: Home / Self Care | Payer: Medicaid Other | Source: Ambulatory Visit | Attending: Family Medicine | Admitting: Family Medicine

## 2018-06-18 ENCOUNTER — Encounter (HOSPITAL_COMMUNITY): Payer: Self-pay | Admitting: *Deleted

## 2018-06-18 DIAGNOSIS — O479 False labor, unspecified: Secondary | ICD-10-CM

## 2018-06-18 DIAGNOSIS — Z3A38 38 weeks gestation of pregnancy: Secondary | ICD-10-CM | POA: Insufficient documentation

## 2018-06-18 DIAGNOSIS — O471 False labor at or after 37 completed weeks of gestation: Secondary | ICD-10-CM | POA: Diagnosis present

## 2018-06-18 DIAGNOSIS — Z3480 Encounter for supervision of other normal pregnancy, unspecified trimester: Secondary | ICD-10-CM

## 2018-06-18 MED ORDER — PRENATAL PLUS 27-1 MG PO TABS: 1.0000 | ORAL_TABLET | Freq: Every day | ORAL | 6 refills | Status: DC

## 2018-06-18 NOTE — MAU Note (Signed)
Pt reports contractions, pressure, back pain. Spotting off/on for 2 days

## 2018-06-18 NOTE — MAU Provider Note (Signed)
   S: RN Term labor check  O: BP 117/73 (BP Location: Right Arm)   Pulse 94   Temp (P) 98.3 F (36.8 C) (Oral)   Resp (P) 18   Ht 5\' 4"  (1.626 m)   Wt 106.6 kg   SpO2 (P) 100%   BMI 40.34 kg/m   Cervical exam:  Dilation: 1 Effacement (%): Thick Station: -3 Presentation: Vertex Exam by:: F. Morris, RNC   Fetal Monitoring: Baseline: 145 Variability: mod Accelerations: 15x15 Decelerations: none Contractions: rare  A: SIUP at [redacted]w[redacted]d  False labor  P: D/C home Follow-up Information    Your Obstetrician Follow up.   Why:  as scheduled or sooner as needed if symptoms worsen       [facility] Follow up.   Why:  as needed if symptoms worsen Contact information: [address] [id number] [place] [phone]         Allergies as of 06/18/2018      Reactions   Bee Venom Anaphylaxis   Other Anaphylaxis   All nuts      Medication List    TAKE these medications   acetaminophen 500 MG tablet Commonly known as:  TYLENOL Take 500 mg by mouth every 6 (six) hours as needed for mild pain.   albuterol 108 (90 Base) MCG/ACT inhaler Commonly known as:  PROVENTIL HFA;VENTOLIN HFA Inhale 2 puffs into the lungs every 4 (four) hours as needed for wheezing or shortness of breath.   EPIPEN 2-PAK 0.3 mg/0.3 mL Soaj injection Generic drug:  EPINEPHrine Inject 0.3 mg into the muscle once.   prenatal vitamin w/FE, FA 27-1 MG Tabs tablet Take 1 tablet by mouth daily at 12 noon.        Tamala Julian, [place], [place] 06/18/2018 6:20 PM

## 2018-06-18 NOTE — Discharge Instructions (Signed)
Braxton Hicks Contractions °Contractions of the uterus can occur throughout pregnancy, but they are not always a sign that you are in labor. You may have practice contractions called Braxton Hicks contractions. These false labor contractions are sometimes confused with true labor. °What are Braxton Hicks contractions? °Braxton Hicks contractions are tightening movements that occur in the muscles of the uterus before labor. Unlike true labor contractions, these contractions do not result in opening (dilation) and thinning of the cervix. Toward the end of pregnancy (32-34 weeks), Braxton Hicks contractions can happen more often and may become stronger. These contractions are sometimes difficult to tell apart from true labor because they can be very uncomfortable. You should not feel embarrassed if you go to the hospital with false labor. °Sometimes, the only way to tell if you are in true labor is for your health care provider to look for changes in the cervix. The health care provider will do a physical exam and may monitor your contractions. If you are not in true labor, the exam should show that your cervix is not dilating and your water has not broken. °If there are other health problems associated with your pregnancy, it is completely safe for you to be sent home with false labor. You may continue to have Braxton Hicks contractions until you go into true labor. °How to tell the difference between true labor and false labor °True labor °· Contractions last 30-70 seconds. °· Contractions become very regular. °· Discomfort is usually felt in the top of the uterus, and it spreads to the lower abdomen and low back. °· Contractions do not go away with walking. °· Contractions usually become more intense and increase in frequency. °· The cervix dilates and gets thinner. °False labor °· Contractions are usually shorter and not as strong as true labor contractions. °· Contractions are usually irregular. °· Contractions  are often felt in the front of the lower abdomen and in the groin. °· Contractions may go away when you walk around or change positions while lying down. °· Contractions get weaker and are shorter-lasting as time goes on. °· The cervix usually does not dilate or become thin. °Follow these instructions at home: °· Take over-the-counter and prescription medicines only as told by your health care provider. °· Keep up with your usual exercises and follow other instructions from your health care provider. °· Eat and drink lightly if you think you are going into labor. °· If Braxton Hicks contractions are making you uncomfortable: °? Change your position from lying down or resting to walking, or change from walking to resting. °? Sit and rest in a tub of warm water. °? Drink enough fluid to keep your urine pale yellow. Dehydration may cause these contractions. °? Do slow and deep breathing several times an hour. °· Keep all follow-up prenatal visits as told by your health care provider. This is important. °Contact a health care provider if: °· You have a fever. °· You have continuous pain in your abdomen. °Get help right away if: °· Your contractions become stronger, more regular, and closer together. °· You have fluid leaking or gushing from your vagina. °· You pass blood-tinged mucus (bloody show). °· You have bleeding from your vagina. °· You have low back pain that you never had before. °· You feel your baby’s head pushing down and causing pelvic pressure. °· Your baby is not moving inside you as much as it used to. °Summary °· Contractions that occur before labor are called Braxton   Hicks contractions, false labor, or practice contractions. °· Braxton Hicks contractions are usually shorter, weaker, farther apart, and less regular than true labor contractions. True labor contractions usually become progressively stronger and regular and they become more frequent. °· Manage discomfort from Braxton Hicks contractions by  changing position, resting in a warm bath, drinking plenty of water, or practicing deep breathing. °This information is not intended to replace advice given to you by your health care provider. Make sure you discuss any questions you have with your health care provider. °Document Released: 12/14/2016 Document Revised: 12/14/2016 Document Reviewed: 12/14/2016 °Elsevier Interactive Patient Education © 2018 Elsevier Inc. ° °

## 2018-06-18 NOTE — MAU Note (Signed)
I have communicated with Manya Silvas, CNM and reviewed vital signs:  Vitals:   06/18/18 1731  BP: 117/73  Pulse: 94  Resp: 17  Temp: 98.3 F (36.8 C)  SpO2: 99%    Vaginal exam:  Dilation: 1 Effacement (%): Thick Station: -3 Presentation: Vertex Exam by:: F. Albi Rappaport, RNC,   Also reviewed contraction pattern and that non-stress test is reactive.  It has been documented that patient is not contracting and has minimal cervical dilatation indicating she's not in active labor.  Patient denies any other complaints.  Based on this report provider has given order for discharge.  A discharge order and diagnosis entered by a provider.   Labor discharge instructions reviewed with patient.

## 2018-06-25 ENCOUNTER — Inpatient Hospital Stay (HOSPITAL_COMMUNITY)
Admission: AD | Admit: 2018-06-25 | Discharge: 2018-06-25 | Disposition: A | Discharge status: Home / Self Care | Payer: Medicaid Other | Source: Ambulatory Visit | Attending: Internal Medicine | Admitting: Internal Medicine

## 2018-06-25 ENCOUNTER — Encounter (HOSPITAL_COMMUNITY): Payer: Self-pay

## 2018-06-25 DIAGNOSIS — O471 False labor at or after 37 completed weeks of gestation: Secondary | ICD-10-CM | POA: Insufficient documentation

## 2018-06-25 DIAGNOSIS — Z3A39 39 weeks gestation of pregnancy: Secondary | ICD-10-CM | POA: Diagnosis not present

## 2018-06-25 DIAGNOSIS — O479 False labor, unspecified: Secondary | ICD-10-CM | POA: Diagnosis present

## 2018-06-25 DIAGNOSIS — Z3480 Encounter for supervision of other normal pregnancy, unspecified trimester: Secondary | ICD-10-CM

## 2018-06-25 NOTE — MAU Note (Addendum)
[name] is a [redacted]w[redacted]d G2P1001 at [redacted]w[redacted]d who presents to MAU today with complaint of contractions on and off since last night. She denies vaginal bleeding. She denies LOF. Her GBS status is negative. Mild ctx every 5 minutes on the monitor. Pt walked for 1.5 hours with minimal change.  BP 124/77   Pulse 100   Temp 98.5 F (36.9 C) (Oral)   Resp 16   Wt 105.8 kg   BMI 40.04 kg/m  Dilation: 2(3 outer) Effacement (%): 20 Station: Ballotable Presentation: Vertex Exam by:: Wilhemena Durie RN

## 2018-06-25 NOTE — MAU Note (Signed)
Has been having pains of and on . Started up last night and has continued, sharp pains in abd and pressure in pelvis. Was 2 cm when last checked. No bleeding or leaking. Normal discharge.

## 2018-06-25 NOTE — Discharge Instructions (Signed)
Braxton Hicks Contractions °Contractions of the uterus can occur throughout pregnancy, but they are not always a sign that you are in labor. You may have practice contractions called Braxton Hicks contractions. These false labor contractions are sometimes confused with true labor. °What are Braxton Hicks contractions? °Braxton Hicks contractions are tightening movements that occur in the muscles of the uterus before labor. Unlike true labor contractions, these contractions do not result in opening (dilation) and thinning of the cervix. Toward the end of pregnancy (32-34 weeks), Braxton Hicks contractions can happen more often and may become stronger. These contractions are sometimes difficult to tell apart from true labor because they can be very uncomfortable. You should not feel embarrassed if you go to the hospital with false labor. °Sometimes, the only way to tell if you are in true labor is for your health care provider to look for changes in the cervix. The health care provider will do a physical exam and may monitor your contractions. If you are not in true labor, the exam should show that your cervix is not dilating and your water has not broken. °If there are other health problems associated with your pregnancy, it is completely safe for you to be sent home with false labor. You may continue to have Braxton Hicks contractions until you go into true labor. °How to tell the difference between true labor and false labor °True labor °· Contractions last 30-70 seconds. °· Contractions become very regular. °· Discomfort is usually felt in the top of the uterus, and it spreads to the lower abdomen and low back. °· Contractions do not go away with walking. °· Contractions usually become more intense and increase in frequency. °· The cervix dilates and gets thinner. °False labor °· Contractions are usually shorter and not as strong as true labor contractions. °· Contractions are usually irregular. °· Contractions  are often felt in the front of the lower abdomen and in the groin. °· Contractions may go away when you walk around or change positions while lying down. °· Contractions get weaker and are shorter-lasting as time goes on. °· The cervix usually does not dilate or become thin. °Follow these instructions at home: °· Take over-the-counter and prescription medicines only as told by your health care provider. °· Keep up with your usual exercises and follow other instructions from your health care provider. °· Eat and drink lightly if you think you are going into labor. °· If Braxton Hicks contractions are making you uncomfortable: °? Change your position from lying down or resting to walking, or change from walking to resting. °? Sit and rest in a tub of warm water. °? Drink enough fluid to keep your urine pale yellow. Dehydration may cause these contractions. °? Do slow and deep breathing several times an hour. °· Keep all follow-up prenatal visits as told by your health care provider. This is important. °Contact a health care provider if: °· You have a fever. °· You have continuous pain in your abdomen. °Get help right away if: °· Your contractions become stronger, more regular, and closer together. °· You have fluid leaking or gushing from your vagina. °· You pass blood-tinged mucus (bloody show). °· You have bleeding from your vagina. °· You have low back pain that you never had before. °· You feel your baby’s head pushing down and causing pelvic pressure. °· Your baby is not moving inside you as much as it used to. °Summary °· Contractions that occur before labor are called Braxton   Hicks contractions, false labor, or practice contractions. °· Braxton Hicks contractions are usually shorter, weaker, farther apart, and less regular than true labor contractions. True labor contractions usually become progressively stronger and regular and they become more frequent. °· Manage discomfort from Braxton Hicks contractions by  changing position, resting in a warm bath, drinking plenty of water, or practicing deep breathing. °This information is not intended to replace advice given to you by your health care provider. Make sure you discuss any questions you have with your health care provider. °Document Released: 12/14/2016 Document Revised: 12/14/2016 Document Reviewed: 12/14/2016 °Elsevier Interactive Patient Education © 2018 Elsevier Inc. ° °

## 2018-06-28 ENCOUNTER — Inpatient Hospital Stay (HOSPITAL_COMMUNITY)
Admission: AD | Admit: 2018-06-28 | Discharge: 2018-06-28 | Disposition: A | Discharge status: Home / Self Care | Payer: Medicaid Other | Source: Ambulatory Visit | Attending: Obstetrics & Gynecology | Admitting: Obstetrics & Gynecology

## 2018-06-28 ENCOUNTER — Encounter (HOSPITAL_COMMUNITY): Payer: Self-pay | Admitting: *Deleted

## 2018-06-28 ENCOUNTER — Other Ambulatory Visit: Payer: Self-pay

## 2018-06-28 ENCOUNTER — Inpatient Hospital Stay (HOSPITAL_COMMUNITY)
Admission: AD | Admit: 2018-06-28 | Discharge: 2018-07-01 | DRG: 807 | Disposition: A | Discharge status: Home / Self Care | Payer: Medicaid Other | Attending: Obstetrics & Gynecology | Admitting: Obstetrics & Gynecology

## 2018-06-28 DIAGNOSIS — Z3A39 39 weeks gestation of pregnancy: Secondary | ICD-10-CM | POA: Insufficient documentation

## 2018-06-28 DIAGNOSIS — O9952 Diseases of the respiratory system complicating childbirth: Secondary | ICD-10-CM | POA: Diagnosis present

## 2018-06-28 DIAGNOSIS — O26893 Other specified pregnancy related conditions, third trimester: Secondary | ICD-10-CM

## 2018-06-28 DIAGNOSIS — Z3480 Encounter for supervision of other normal pregnancy, unspecified trimester: Secondary | ICD-10-CM

## 2018-06-28 DIAGNOSIS — D252 Subserosal leiomyoma of uterus: Secondary | ICD-10-CM | POA: Diagnosis present

## 2018-06-28 DIAGNOSIS — N898 Other specified noninflammatory disorders of vagina: Secondary | ICD-10-CM

## 2018-06-28 DIAGNOSIS — O479 False labor, unspecified: Secondary | ICD-10-CM

## 2018-06-28 DIAGNOSIS — J45909 Unspecified asthma, uncomplicated: Secondary | ICD-10-CM | POA: Diagnosis present

## 2018-06-28 DIAGNOSIS — O3413 Maternal care for benign tumor of corpus uteri, third trimester: Secondary | ICD-10-CM | POA: Diagnosis present

## 2018-06-28 LAB — POCT FERN TEST: POCT FERN TEST: NEGATIVE

## 2018-06-28 NOTE — MAU Note (Signed)
Presents with c/o ctxs that began this morning @ 0630.  Also reports LOF "brown" since 0630.  Denies VB.  Reports +FM.

## 2018-06-28 NOTE — Discharge Instructions (Signed)
Braxton Hicks Contractions °Contractions of the uterus can occur throughout pregnancy, but they are not always a sign that you are in labor. You may have practice contractions called Braxton Hicks contractions. These false labor contractions are sometimes confused with true labor. °What are Braxton Hicks contractions? °Braxton Hicks contractions are tightening movements that occur in the muscles of the uterus before labor. Unlike true labor contractions, these contractions do not result in opening (dilation) and thinning of the cervix. Toward the end of pregnancy (32-34 weeks), Braxton Hicks contractions can happen more often and may become stronger. These contractions are sometimes difficult to tell apart from true labor because they can be very uncomfortable. You should not feel embarrassed if you go to the hospital with false labor. °Sometimes, the only way to tell if you are in true labor is for your health care provider to look for changes in the cervix. The health care provider will do a physical exam and may monitor your contractions. If you are not in true labor, the exam should show that your cervix is not dilating and your water has not broken. °If there are other health problems associated with your pregnancy, it is completely safe for you to be sent home with false labor. You may continue to have Braxton Hicks contractions until you go into true labor. °How to tell the difference between true labor and false labor °True labor °· Contractions last 30-70 seconds. °· Contractions become very regular. °· Discomfort is usually felt in the top of the uterus, and it spreads to the lower abdomen and low back. °· Contractions do not go away with walking. °· Contractions usually become more intense and increase in frequency. °· The cervix dilates and gets thinner. °False labor °· Contractions are usually shorter and not as strong as true labor contractions. °· Contractions are usually irregular. °· Contractions  are often felt in the front of the lower abdomen and in the groin. °· Contractions may go away when you walk around or change positions while lying down. °· Contractions get weaker and are shorter-lasting as time goes on. °· The cervix usually does not dilate or become thin. °Follow these instructions at home: °· Take over-the-counter and prescription medicines only as told by your health care provider. °· Keep up with your usual exercises and follow other instructions from your health care provider. °· Eat and drink lightly if you think you are going into labor. °· If Braxton Hicks contractions are making you uncomfortable: °? Change your position from lying down or resting to walking, or change from walking to resting. °? Sit and rest in a tub of warm water. °? Drink enough fluid to keep your urine pale yellow. Dehydration may cause these contractions. °? Do slow and deep breathing several times an hour. °· Keep all follow-up prenatal visits as told by your health care provider. This is important. °Contact a health care provider if: °· You have a fever. °· You have continuous pain in your abdomen. °Get help right away if: °· Your contractions become stronger, more regular, and closer together. °· You have fluid leaking or gushing from your vagina. °· You pass blood-tinged mucus (bloody show). °· You have bleeding from your vagina. °· You have low back pain that you never had before. °· You feel your baby’s head pushing down and causing pelvic pressure. °· Your baby is not moving inside you as much as it used to. °Summary °· Contractions that occur before labor are called Braxton   Hicks contractions, false labor, or practice contractions. °· Braxton Hicks contractions are usually shorter, weaker, farther apart, and less regular than true labor contractions. True labor contractions usually become progressively stronger and regular and they become more frequent. °· Manage discomfort from Braxton Hicks contractions by  changing position, resting in a warm bath, drinking plenty of water, or practicing deep breathing. °This information is not intended to replace advice given to you by your health care provider. Make sure you discuss any questions you have with your health care provider. °Document Released: 12/14/2016 Document Revised: 12/14/2016 Document Reviewed: 12/14/2016 °Elsevier Interactive Patient Education © 2018 Elsevier Inc. ° °Fetal Movement Counts °Patient Name: ________________________________________________ Patient Due Date: ____________________ °What is a fetal movement count? °A fetal movement count is the number of times that you feel your baby move during a certain amount of time. This may also be called a fetal kick count. A fetal movement count is recommended for every pregnant woman. You may be asked to start counting fetal movements as early as week 28 of your pregnancy. °Pay attention to when your baby is most active. You may notice your baby's sleep and wake cycles. You may also notice things that make your baby move more. You should do a fetal movement count: °· When your baby is normally most active. °· At the same time each day. ° °A good time to count movements is while you are resting, after having something to eat and drink. °How do I count fetal movements? °1. Find a quiet, comfortable area. Sit, or lie down on your side. °2. Write down the date, the start time and stop time, and the number of movements that you felt between those two times. Take this information with you to your health care visits. °3. For 2 hours, count kicks, flutters, swishes, rolls, and jabs. You should feel at least 10 movements during 2 hours. °4. You may stop counting after you have felt 10 movements. °5. If you do not feel 10 movements in 2 hours, have something to eat and drink. Then, keep resting and counting for 1 hour. If you feel at least 4 movements during that hour, you may stop counting. °Contact a health care  provider if: °· You feel fewer than 4 movements in 2 hours. °· Your baby is not moving like he or she usually does. °Date: ____________ Start time: ____________ Stop time: ____________ Movements: ____________ °Date: ____________ Start time: ____________ Stop time: ____________ Movements: ____________ °Date: ____________ Start time: ____________ Stop time: ____________ Movements: ____________ °Date: ____________ Start time: ____________ Stop time: ____________ Movements: ____________ °Date: ____________ Start time: ____________ Stop time: ____________ Movements: ____________ °Date: ____________ Start time: ____________ Stop time: ____________ Movements: ____________ °Date: ____________ Start time: ____________ Stop time: ____________ Movements: ____________ °Date: ____________ Start time: ____________ Stop time: ____________ Movements: ____________ °Date: ____________ Start time: ____________ Stop time: ____________ Movements: ____________ °This information is not intended to replace advice given to you by your health care provider. Make sure you discuss any questions you have with your health care provider. °Document Released: 08/30/2006 Document Revised: 03/29/2016 Document Reviewed: 09/09/2015 °Elsevier Interactive Patient Education © 2018 Elsevier Inc. ° °

## 2018-06-28 NOTE — MAU Provider Note (Signed)
S: Ms. MIEL WISENER is a 32 y.o. G2P1001 at [redacted]w[redacted]d  who presents to MAU today complaining of leaking of fluid since 6:30 on 11/15 with brown fluid. She is unsure if the brown discharge is  vaginal bleeding. Had her membranes stripped in the office yesterday. She endorses contractions. She reports normal fetal movement.    O: BP 118/74 (BP Location: Left Arm)   Pulse (!) 116   Temp (!) 97.5 F (36.4 C) (Oral)   Resp 18   Ht 5\' 4"  (1.626 m)   Wt 106.7 kg   SpO2 100%   BMI 40.39 kg/m  GENERAL: Well-developed, well-nourished female in no acute distress.  HEAD: Normocephalic, atraumatic.  CHEST: Normal effort of breathing, regular heart rate ABDOMEN: Soft, nontender, gravid PELVIC: Normal external female genitalia. Vagina is pink and rugated. Cervix with normal contour, no lesions. Normal discharge.  No pooling.   Cervical exam:  Dilation: 3.5 Effacement (%): 60 Station: -3 Presentation: Vertex Exam by:: F. Morris, RNC   Fetal Monitoring: Baseline: 150 Variability: moderate Accelerations: + Decelerations: no Contractions: Occasional   No results found for this or any previous visit (from the past 24 hour(s)).   A: SIUP at [redacted]w[redacted]d  Membranes intact. Negative fern test.   P: Rest of management per labor team. RN to contact.  Nicolette Bang, DO 06/28/2018 6:16 PM

## 2018-06-29 ENCOUNTER — Other Ambulatory Visit: Payer: Self-pay

## 2018-06-29 ENCOUNTER — Encounter (HOSPITAL_COMMUNITY): Payer: Self-pay

## 2018-06-29 DIAGNOSIS — Z3A39 39 weeks gestation of pregnancy: Secondary | ICD-10-CM | POA: Diagnosis not present

## 2018-06-29 DIAGNOSIS — J45909 Unspecified asthma, uncomplicated: Secondary | ICD-10-CM | POA: Diagnosis present

## 2018-06-29 DIAGNOSIS — D252 Subserosal leiomyoma of uterus: Secondary | ICD-10-CM | POA: Diagnosis present

## 2018-06-29 DIAGNOSIS — Z3483 Encounter for supervision of other normal pregnancy, third trimester: Secondary | ICD-10-CM | POA: Diagnosis present

## 2018-06-29 DIAGNOSIS — O9952 Diseases of the respiratory system complicating childbirth: Secondary | ICD-10-CM | POA: Diagnosis present

## 2018-06-29 DIAGNOSIS — O3413 Maternal care for benign tumor of corpus uteri, third trimester: Secondary | ICD-10-CM | POA: Diagnosis present

## 2018-06-29 LAB — CBC
HEMATOCRIT: 39.3 % (ref 36.0–46.0)
Hemoglobin: 12.6 g/dL (ref 12.0–15.0)
MCH: 25.8 pg — ABNORMAL LOW (ref 26.0–34.0)
MCHC: 32.1 g/dL (ref 30.0–36.0)
MCV: 80.4 fL (ref 80.0–100.0)
NRBC: 0 % (ref 0.0–0.2)
PLATELETS: 206 10*3/uL (ref 150–400)
RBC: 4.89 MIL/uL (ref 3.87–5.11)
RDW: 15.6 % — ABNORMAL HIGH (ref 11.5–15.5)
WBC: 12.1 10*3/uL — AB (ref 4.0–10.5)

## 2018-06-29 LAB — TYPE AND SCREEN
ABO/RH(D): O POS
Antibody Screen: NEGATIVE

## 2018-06-29 LAB — ABO/RH: ABO/RH(D): O POS

## 2018-06-29 LAB — RPR: RPR Ser Ql: NONREACTIVE

## 2018-06-29 MED ORDER — OXYCODONE-ACETAMINOPHEN 5-325 MG PO TABS: 2.0000 | ORAL_TABLET | ORAL | Status: DC | PRN

## 2018-06-29 MED ORDER — BENZOCAINE-MENTHOL 20-0.5 % EX AERO: 1.0000 "application " | INHALATION_SPRAY | CUTANEOUS | Status: DC | PRN

## 2018-06-29 MED ORDER — OXYTOCIN BOLUS FROM INFUSION
500.0000 mL | Freq: Once | INTRAVENOUS | Status: AC
  Administered 2018-06-29: 500 mL via INTRAVENOUS

## 2018-06-29 MED ORDER — DIBUCAINE 1 % RE OINT: 1.0000 "application " | TOPICAL_OINTMENT | RECTAL | Status: DC | PRN

## 2018-06-29 MED ORDER — SENNOSIDES-DOCUSATE SODIUM 8.6-50 MG PO TABS
2.0000 | ORAL_TABLET | ORAL | Status: DC
  Administered 2018-06-29 – 2018-07-01 (×2): 2 via ORAL
  Filled 2018-06-29: qty 2

## 2018-06-29 MED ORDER — WITCH HAZEL-GLYCERIN EX PADS: 1.0000 "application " | MEDICATED_PAD | CUTANEOUS | Status: DC | PRN

## 2018-06-29 MED ORDER — FLEET ENEMA 7-19 GM/118ML RE ENEM: 1.0000 | ENEMA | RECTAL | Status: DC | PRN

## 2018-06-29 MED ORDER — OXYTOCIN 40 UNITS IN LACTATED RINGERS INFUSION - SIMPLE MED
2.5000 [IU]/h | INTRAVENOUS | Status: DC
  Administered 2018-06-29: 2.5 [IU]/h via INTRAVENOUS
  Filled 2018-06-29: qty 1000

## 2018-06-29 MED ORDER — ONDANSETRON HCL 4 MG/2ML IJ SOLN: 4.0000 mg | INTRAMUSCULAR | Status: DC | PRN

## 2018-06-29 MED ORDER — LACTATED RINGERS IV SOLN
500.0000 mL | INTRAVENOUS | Status: DC | PRN
  Administered 2018-06-29: 500 mL via INTRAVENOUS

## 2018-06-29 MED ORDER — ACETAMINOPHEN 325 MG PO TABS: 650.0000 mg | ORAL_TABLET | ORAL | Status: DC | PRN

## 2018-06-29 MED ORDER — SIMETHICONE 80 MG PO CHEW: 80.0000 mg | CHEWABLE_TABLET | ORAL | Status: DC | PRN

## 2018-06-29 MED ORDER — ALBUTEROL SULFATE (2.5 MG/3ML) 0.083% IN NEBU: 3.0000 mL | INHALATION_SOLUTION | RESPIRATORY_TRACT | Status: DC | PRN

## 2018-06-29 MED ORDER — ONDANSETRON HCL 4 MG/2ML IJ SOLN: 4.0000 mg | Freq: Four times a day (QID) | INTRAMUSCULAR | Status: DC | PRN

## 2018-06-29 MED ORDER — DIPHENHYDRAMINE HCL 25 MG PO CAPS: 25.0000 mg | ORAL_CAPSULE | Freq: Four times a day (QID) | ORAL | Status: DC | PRN

## 2018-06-29 MED ORDER — OXYCODONE-ACETAMINOPHEN 5-325 MG PO TABS: 1.0000 | ORAL_TABLET | ORAL | Status: DC | PRN

## 2018-06-29 MED ORDER — ZOLPIDEM TARTRATE 5 MG PO TABS: 5.0000 mg | ORAL_TABLET | Freq: Every evening | ORAL | Status: DC | PRN

## 2018-06-29 MED ORDER — SOD CITRATE-CITRIC ACID 500-334 MG/5ML PO SOLN: 30.0000 mL | ORAL | Status: DC | PRN

## 2018-06-29 MED ORDER — LIDOCAINE HCL (PF) 1 % IJ SOLN
30.0000 mL | INTRAMUSCULAR | Status: DC | PRN
  Filled 2018-06-29: qty 30

## 2018-06-29 MED ORDER — LACTATED RINGERS IV SOLN
INTRAVENOUS | Status: DC
  Administered 2018-06-29 (×2): via INTRAVENOUS

## 2018-06-29 MED ORDER — PRENATAL MULTIVITAMIN CH
1.0000 | ORAL_TABLET | Freq: Every day | ORAL | Status: DC
  Administered 2018-06-29 – 2018-07-01 (×3): 1 via ORAL
  Filled 2018-06-29 (×3): qty 1

## 2018-06-29 MED ORDER — IBUPROFEN 600 MG PO TABS
600.0000 mg | ORAL_TABLET | Freq: Four times a day (QID) | ORAL | Status: DC
  Administered 2018-06-29 – 2018-07-01 (×9): 600 mg via ORAL
  Filled 2018-06-29 (×8): qty 1

## 2018-06-29 MED ORDER — COCONUT OIL OIL: 1.0000 "application " | TOPICAL_OIL | Status: DC | PRN

## 2018-06-29 MED ORDER — ONDANSETRON HCL 4 MG PO TABS: 4.0000 mg | ORAL_TABLET | ORAL | Status: DC | PRN

## 2018-06-29 MED ORDER — TETANUS-DIPHTH-ACELL PERTUSSIS 5-2.5-18.5 LF-MCG/0.5 IM SUSP: 0.5000 mL | Freq: Once | INTRAMUSCULAR | Status: DC

## 2018-06-29 MED ORDER — FENTANYL CITRATE (PF) 100 MCG/2ML IJ SOLN
100.0000 ug | INTRAMUSCULAR | Status: DC | PRN
  Administered 2018-06-29 (×3): 100 ug via INTRAVENOUS
  Filled 2018-06-29 (×4): qty 2

## 2018-06-29 NOTE — Lactation Note (Signed)
This note was copied from a baby's chart. Lactation Consultation Note  Patient Name: Sandra Krueger Today's Date: 06/29/2018 Reason for consult: Term;Initial assessment  Baby is 3 hours old  [place] reviewed and updated the doc flow sheets per mom.  And baby last fed at 6 :40 pm for 10 mins.  Per mom active with [place] [facility] [place] and will need a hand pump  Prior to D/C .  LC reviewed the LATCH score and the importance of having the RN or  The LC check latch. Enc mom to call.  Mother informed of post-discharge support and given phone number to the lactation department, including services for phone call assistance; out-patient appointments; and breastfeeding support group. List of other breastfeeding resources in the community given in the handout. Encouraged mother to call for problems or concerns related to breastfeeding.    Maternal Data Does the patient have breastfeeding experience prior to this delivery?: Yes  Feeding Feeding Type: (last fed aT 6:40 FOR 10 MINS )  LATCH Score                   Interventions Interventions: Breast feeding basics reviewed  Lactation Tools Discussed/Used WIC Program: Yes   Consult Status Consult Status: Follow-up Date: 06/29/18 Follow-up type: In-patient    [place] 06/29/2018, 6:58 PM

## 2018-06-29 NOTE — Progress Notes (Signed)
Sandra Krueger is a 32 y.o. G2P1001 at [redacted]w[redacted]d admitted for active labor  Subjective: Breathing w/ ctx, supported by mother. Does not want an epidural.   Objective: BP (!) 104/53   Pulse 88   Temp 97.6 F (36.4 C) (Oral)   Resp 18   Ht 5\' 4"  (1.626 m)   Wt 106.7 kg   BMI 40.38 kg/m    FHT:  FHR: 130 bpm, variability: moderate,  accelerations:  Present,  decelerations:  Absent UC:   regular, every 2-3 minutes SVE:   Dilation: 6 Effacement (%): 100 Station: -1 Exam by:: Raquel Sarna Rothermel RN   Labs: Lab Results  Component Value Date   WBC 12.1 (H) 06/28/2018   HGB 12.6 06/28/2018   HCT 39.3 06/28/2018   MCV 80.4 06/28/2018   PLT 206 06/28/2018    Assessment / Plan: Spontaneous labor, progressing normally  Labor: Progressing normally Fetal Wellbeing:  Category I Pain Control:  IV pain meds and Nitrous Oxide I/D:  GBS neg Anticipated MOD:  NSVD  [name] 06/29/2018, 2:32 AM

## 2018-06-29 NOTE — H&P (Signed)
OBSTETRIC ADMISSION HISTORY AND PHYSICAL  Sandra Krueger is a 32 y.o. female G2P1001 with IUP at [redacted]w[redacted]d by 7 week [place] presenting for labor.   Reports fetal movement. Some bloody show, no leakage of fluid.   She received her prenatal care at [facility] in [place], [place].  Support person in labor: mom  Ultrasounds . Anatomy U/S: normal, notable for ~8 cm subserosal fibroid on left of uterus  Prenatal History/Complications: . Placental lake vs. Subchorionic hemorrhage, received BMZ x 1 at 36 weeks . 8 cm subserosal fibroid on left of uterus  Past Medical History: Past Medical History:  Diagnosis Date  . Asthma   . Fibroids   . Headache   . Panic attack    last episode approx two months ago per pt    Past Surgical History: Past Surgical History:  Procedure Laterality Date  . OTHER SURGICAL HISTORY     fibroids removed  . OVARY SURGERY     cyst removed from ? right ovary    Obstetrical History: OB History    Gravida  2   Para  1   Term  1   Preterm      AB      Living  1     SAB      TAB      Ectopic      Multiple      Live Births  1           Social History: Social History   Socioeconomic History  . Marital status: Single    Spouse name: Not on file  . Number of children: Not on file  . Years of education: Not on file  . Highest education level: Not on file  Occupational History  . Not on file  Social Needs  . Financial resource strain: Not on file  . Food insecurity:    Worry: Not on file    Inability: Not on file  . Transportation needs:    Medical: Not on file    Non-medical: Not on file  Tobacco Use  . Smoking status: Never Smoker  . Smokeless tobacco: Never Used  Substance and Sexual Activity  . Alcohol use: Not Currently  . Drug use: Not Currently    Types: Marijuana  . Sexual activity: Not Currently    Birth control/protection: None  Lifestyle  . Physical activity:    Days per week: Not on file    Minutes per session: Not  on file  . Stress: Not on file  Relationships  . Social connections:    Talks on phone: Not on file    Gets together: Not on file    Attends religious service: Not on file    Active member of club or organization: Not on file    Attends meetings of clubs or organizations: Not on file    Relationship status: Not on file  Other Topics Concern  . Not on file  Social History Narrative  . Not on file    Family History: Family History  Problem Relation Age of Onset  . Hypertension Mother     Allergies: Allergies  Allergen Reactions  . Bee Venom Anaphylaxis  . Other Anaphylaxis    All nuts    Medications Prior to Admission  Medication Sig Dispense Refill Last Dose  . acetaminophen (TYLENOL) 500 MG tablet Take 500 mg by mouth every 6 (six) hours as needed for mild pain.   Taking  . albuterol (PROVENTIL HFA;VENTOLIN HFA)  108 (90 Base) MCG/ACT inhaler Inhale 2 puffs into the lungs every 4 (four) hours as needed for wheezing or shortness of breath.   Taking  . EPINEPHrine (EPIPEN 2-PAK) 0.3 mg/0.3 mL IJ SOAJ injection Inject 0.3 mg into the muscle once.   Taking  . prenatal vitamin w/FE, FA (PRENATAL 1 + 1) 27-1 MG TABS tablet Take 1 tablet by mouth daily at 12 noon. 30 each 6      Review of Systems  All systems reviewed and negative except as stated in HPI  Temperature 97.6 F (36.4 C), temperature source Oral, resp. rate 19, height 5\' 4"  (1.626 m), weight 106.7 kg. General appearance: moderate distress, breathing through contractions Lungs: no respiratory distress Heart: regular rate  Abdomen: soft, non-tender; gravid b Pelvic: deferred Extremities: Homans sign is negative, no sign of DVT Presentation: cephalic Fetal monitoring: 150/mod var/+accels/no decels Uterine activity: q2-3 m Dilation: 6 Effacement (%): 90 Station: -2 Exam by:: Maryagnes Amos RN  Prenatal labs: ABO, Rh: O/Positive/-- (05/03 0000) Antibody: Negative (05/03 0000) Rubella: Nonimmune (05/03  0000) RPR: Nonreactive (05/03 0000)  HBsAg: Negative (05/03 0000)  HIV: Non-reactive (05/03 0000)  GBS: Negative (10/24 0000)  Glucola: elevated 1 hr, normal 3 hr Genetic screening:  none  Prenatal Transfer Tool  Maternal Diabetes: No Genetic Screening: Declined Maternal Ultrasounds/Referrals: Abnormal:  Findings:   Other: 8 cm fibroid, placental lake vs. Subchorionic hemorrhage Fetal Ultrasounds or other Referrals:  None Maternal Substance Abuse:  No Significant Maternal Medications:  None Significant Maternal Lab Results: None  Results for orders placed or performed during the hospital encounter of 06/28/18 (from the past 24 hour(s))  CBC   Collection Time: 06/28/18 11:57 PM  Result Value Ref Range   WBC 12.1 (H) 4.0 - 10.5 K/uL   RBC 4.89 3.87 - 5.11 MIL/uL   Hemoglobin 12.6 12.0 - 15.0 g/dL   HCT 39.3 36.0 - 46.0 %   MCV 80.4 80.0 - 100.0 fL   MCH 25.8 (L) 26.0 - 34.0 pg   MCHC 32.1 30.0 - 36.0 g/dL   RDW 15.6 (H) 11.5 - 15.5 %   Platelets 206 150 - 400 K/uL   nRBC 0.0 0.0 - 0.2 %  Results for orders placed or performed during the hospital encounter of 06/28/18 (from the past 24 hour(s))  POCT fern test   Collection Time: 06/28/18  6:18 PM  Result Value Ref Range   POCT Fern Test Negative = intact amniotic membranes     Patient Active Problem List   Diagnosis Date Noted  . Indication for care in labor or delivery 06/29/2018  . False labor 06/25/2018  . Supervision of other normal pregnancy, antepartum 05/01/2018    Assessment/Plan:  Sandra Krueger is a 32 y.o. G2P1001 at [redacted]w[redacted]d here for labor.  Labor: in active labor. Expect NSVD. -- pain control: IV pain meds, declines epidural at this time.   Fetal Wellbeing: EFW 8 lb by Leopold's, EFW 7 lb 11 oz (81%) at [redacted]w[redacted]d. Cephalic by [place] 85/63 -- GBS (neg) -- continuous fetal monitoring    Postpartum Planning -- breast/undecided -- R immune/need to assess flu and tdap  Mickel Duhamel, MD OB/GYN Fellow

## 2018-06-29 NOTE — Progress Notes (Signed)
S:  Continues to breath w/ ctx. Subsequent IV sedation not as effective. Declines nitrous gas and epidural. Requesting AROM in hopes of expediting birth.   O:  VS: BP (!) 112/58   Pulse (!) 101   Temp 98 F (36.7 C) (Oral)   Resp 18   Ht 5\' 4"  (1.626 m)   Wt 106.7 kg   BMI 40.38 kg/m         FHR : baseline 135 / variability mod / accelerations present / variable decelerations        Toco: contractions every 2-3 minutes / mod-strong         Cervix : Dilation: 7 Effacement (%): 90 Cervical Position: Middle Station: -2 Presentation: Vertex Exam by:: Burman Foster, CNM         Membranes: AROM, clear, moderate amount  A:  Active labor FHR category I  P:  Expectant management Anesthesia/Analgesia PRN pt request Anticipate NSVB     [name]  06/29/2018, 4:09 AM

## 2018-06-30 NOTE — Progress Notes (Signed)
POSTPARTUM PROGRESS NOTE  Post Partum Day 1  Subjective:  Sandra Krueger is a 32 y.o. [id number] s/p SVD at [redacted]w[redacted]d.  No acute events overnight.  Pt denies problems with ambulating, voiding or po intake.  She denies nausea or vomiting.  Pain is well controlled.  She has had flatus. She has not had bowel movement.  Lochia Small.   Objective: Blood pressure 103/66, pulse 88, temperature 98.3 F (36.8 C), temperature source Oral, resp. rate 17, height 5\' 4"  (1.626 m), weight 106.7 kg, SpO2 97 %, unknown if currently breastfeeding.  Physical Exam:  General: alert, cooperative and no distress Chest: no respiratory distress Heart:regular rate, distal pulses intact Abdomen: soft, nontender,  Uterine Fundus: firm, appropriately tender DVT Evaluation: No calf swelling or tenderness Extremities: No edema  Recent Labs    06/28/18 2357  HGB 12.6  HCT 39.3    Assessment/Plan: Sandra Krueger is a 32 y.o. [id number] s/p SVD at [redacted]w[redacted]d   PPD#1 - Doing well Contraception: unsure Feeding: breast Dispo: Plan for discharge tomorrow.   LOS: 1 day   Lajean Manes, CNM 06/30/2018, 1:23 PM

## 2018-07-01 MED ORDER — SENNOSIDES-DOCUSATE SODIUM 8.6-50 MG PO TABS: 2.0000 | ORAL_TABLET | ORAL | 0 refills | Status: DC

## 2018-07-01 MED ORDER — CYCLOBENZAPRINE HCL 5 MG PO TABS: 5.0000 mg | ORAL_TABLET | Freq: Three times a day (TID) | ORAL | 0 refills | Status: DC | PRN

## 2018-07-01 MED ORDER — IBUPROFEN 600 MG PO TABS: 600.0000 mg | ORAL_TABLET | Freq: Four times a day (QID) | ORAL | 1 refills | Status: DC

## 2018-07-01 NOTE — Lactation Note (Signed)
This note was copied from a baby's chart. Lactation Consultation Note  Patient Name: Sandra Krueger [id number] Date: 07/01/2018 Reason for consult: Follow-up assessment MD assessing infant on arrival.  Infant crying,cuing when gives to mom.  Instead of offering to feed her, mom put pacifier in mouth.  Reviewed AAP guidelines and pacifier use. Urged to delay pacifiers until breastfeeding is well established.  Mom has no pump for home use.  Took manual pump and demo how to use it. Mom got a few ml of milk.  Urged parents to spoon feed this to her past bf. Reviewed how to know she is getting enough.  Encouraged parents to feed on cue day or night and at least every 3 hours. Infant spitting pacifier out.  Urged mom to fed her since pacifier is not what she wants,  Assist with positioning and latch.  Mom worried about her nose.  Explained to mom that if she was positioned correctly she should not have to worry about her nose,  Urged mom to make sure cheeks and chin touching breast and in close tummy to momy.  Mom has breastfeeding resources and consultation services for going home.  Urged her to call as needed.   Maternal Data    Feeding Feeding Type: Breast Fed  LATCH Score Latch: Grasps breast easily, tongue down, lips flanged, rhythmical sucking.  Audible Swallowing: Spontaneous and intermittent  Type of Nipple: Everted at rest and after stimulation  Comfort (Breast/Nipple): Soft / non-tender  Hold (Positioning): Assistance needed to correctly position infant at breast and maintain latch.  LATCH Score: 9  Interventions Interventions: Breast feeding basics reviewed;Assisted with latch;Hand express;Pre-pump if needed  Lactation Tools Discussed/Used Tools: Pump Breast pump type: Manual   Consult Status Consult Status: Complete Date: 07/01/18 Follow-up type: Call as needed    [facility] 07/01/2018, 9:44 AM

## 2018-07-01 NOTE — Discharge Summary (Signed)
OB Discharge Summary     Patient Name: Sandra Krueger DOB: 10/31/1985 MRN: [id number]  Date of admission: 06/28/2018 Delivering MD: Burman Foster B   Date of discharge: 07/01/2018  Admitting diagnosis: ctxs 5 mins apart Intrauterine pregnancy: [redacted]w[redacted]d     Secondary diagnosis:  Active Problems:   Indication for care in labor or delivery   SVD (11/16)  Additional problems none     Discharge diagnosis: Term Pregnancy Delivered                                                                                                Post partum procedures:none  Augmentation: AROM  Complications: None  Hospital course:  Onset of Labor With Vaginal Delivery     32 y.o. yo [id number] at [redacted]w[redacted]d was admitted in Active Labor on 06/28/2018. Patient had an uncomplicated labor course as follows:  Membrane Rupture Time/Date: 3:15 AM ,06/29/2018   Intrapartum Procedures: Episiotomy: None [1]                                         Lacerations:  None [1];Cervical [7]  Patient had a delivery of a Viable infant. 06/29/2018  Information for the patient's newborn:  Stefanie, Hodgens [[id number]]  Delivery Method: Vaginal, Spontaneous(Filed from Delivery Summary)    Pateint had an uncomplicated postpartum course.  She is ambulating, tolerating a regular diet, passing flatus, and urinating well. Patient is discharged home in stable condition on 07/01/18. Patient complaining of leg pain consistent with sciatica she has had in the past, would like to be discharged home with a muscle relaxer.    Physical exam  Vitals:   06/30/18 0531 06/30/18 1433 07/01/18 0033 07/01/18 0520  BP: 103/66 106/62 (!) 106/56 94/62  Pulse: 88 82 86 82  Resp: 17 19  18   Temp: 98.3 F (36.8 C) 98.4 F (36.9 C) 98.2 F (36.8 C) 97.9 F (36.6 C)  TempSrc: Oral Oral Oral Oral  SpO2: 97% 96%  98%  Weight:      Height:       General: alert, cooperative and no distress Lochia: appropriate Uterine Fundus: soft Incision:  N/A DVT Evaluation: No evidence of DVT seen on physical exam. Negative Homan's sign. No cords or calf tenderness. No significant calf/ankle edema. Labs: Lab Results  Component Value Date   WBC 12.1 (H) 06/28/2018   HGB 12.6 06/28/2018   HCT 39.3 06/28/2018   MCV 80.4 06/28/2018   PLT 206 06/28/2018   CMP Latest Ref Rng & Units 04/04/2018  Glucose 70 - 99 mg/dL 95  BUN 6 - 20 mg/dL 8  Creatinine 0.44 - 1.00 mg/dL 0.43(L)  Sodium 135 - 145 mmol/L 134(L)  Potassium 3.5 - 5.1 mmol/L 3.3(L)  Chloride 98 - 111 mmol/L 104  CO2 22 - 32 mmol/L 23  Calcium 8.9 - 10.3 mg/dL 8.5(L)    Discharge instruction: per After Visit Summary and "Baby and Me Booklet".  After visit meds:  Allergies as of 07/01/2018  Reactions   Bee Venom Anaphylaxis   Other Anaphylaxis   All nuts      Medication List    TAKE these medications   acetaminophen 500 MG tablet Commonly known as:  TYLENOL Take 500 mg by mouth every 6 (six) hours as needed for mild pain.   albuterol 108 (90 Base) MCG/ACT inhaler Commonly known as:  PROVENTIL HFA;VENTOLIN HFA Inhale 2 puffs into the lungs every 4 (four) hours as needed for wheezing or shortness of breath.   cyclobenzaprine 5 MG tablet Commonly known as:  FLEXERIL Take 1 tablet (5 mg total) by mouth 3 (three) times daily as needed for muscle spasms.   ibuprofen 600 MG tablet Commonly known as:  ADVIL,MOTRIN Take 1 tablet (600 mg total) by mouth every 6 (six) hours.   prenatal vitamin w/FE, FA 27-1 MG Tabs tablet Take 1 tablet by mouth daily at 12 noon.   senna-docusate 8.6-50 MG tablet Commonly known as:  Senokot-S Take 2 tablets by mouth daily. Start taking on:  07/02/2018       Diet: routine diet  Activity: Advance as tolerated. Pelvic rest for 6 weeks.   Outpatient follow up:6 weeks Follow up Appt: Future Appointments  Date Time Provider [place]  08/05/2018  1:15 PM Tresea Mall, CNM [facility]   Follow up Visit:No  follow-ups on file.  Postpartum contraception: Undecided, given information on contraceptive types.   Newborn Data: Live born female  Birth Weight: 7 lb 11 oz (3487 g) APGAR: 8, 9  Newborn Delivery   Birth date/time:  06/29/2018 05:15:00 Delivery type:  Vaginal, Spontaneous     Baby Feeding: Breast Disposition:home with mother   Phill Myron, D.O. [facility] Family Medicine Fellow, [facility] for [facility], [facility] 07/01/2018, 8:04 AM

## 2018-07-01 NOTE — Discharge Instructions (Signed)
Postpartum Care After Vaginal Delivery °The period of time right after you deliver your newborn is called the postpartum period. °What kind of medical care will I receive? °· You may continue to receive fluids and medicines through an IV tube inserted into one of your veins. °· If an incision was made near your vagina (episiotomy) or if you had some vaginal tearing during delivery, cold compresses may be placed on your episiotomy or your tear. This helps to reduce pain and swelling. °· You may be given a squirt bottle to use when you go to the bathroom. You may use this until you are comfortable wiping as usual. To use the squirt bottle, follow these steps: °? Before you urinate, fill the squirt bottle with warm water. Do not use hot water. °? After you urinate, while you are sitting on the toilet, use the squirt bottle to rinse the area around your urethra and vaginal opening. This rinses away any urine and blood. °? You may do this instead of wiping. As you start healing, you may use the squirt bottle before wiping yourself. Make sure to wipe gently. °? Fill the squirt bottle with clean water every time you use the bathroom. °· You will be given sanitary pads to wear. °How can I expect to feel? °· You may not feel the need to urinate for several hours after delivery. °· You will have some soreness and pain in your abdomen and vagina. °· If you are breastfeeding, you may have uterine contractions every time you breastfeed for up to several weeks postpartum. Uterine contractions help your uterus return to its normal size. °· It is normal to have vaginal bleeding (lochia) after delivery. The amount and appearance of lochia is often similar to a menstrual period in the first week after delivery. It will gradually decrease over the next few weeks to a dry, yellow-brown discharge. For most women, lochia stops completely by 6-8 weeks after delivery. Vaginal bleeding can vary from woman to woman. °· Within the first few  days after delivery, you may have breast engorgement. This is when your breasts feel heavy, full, and uncomfortable. Your breasts may also throb and feel hard, tightly stretched, warm, and tender. After this occurs, you may have milk leaking from your breasts. Your health care provider can help you relieve discomfort due to breast engorgement. Breast engorgement should go away within a few days. °· You may feel more sad or worried than normal due to hormonal changes after delivery. These feelings should not last more than a few days. If these feelings do not go away after several days, speak with your health care provider. °How should I care for myself? °· Tell your health care provider if you have pain or discomfort. °· Drink enough water to keep your urine clear or pale yellow. °· Wash your hands thoroughly with soap and water for at least 20 seconds after changing your sanitary pads, after using the toilet, and before holding or feeding your baby. °· If you are not breastfeeding, avoid touching your breasts a lot. Doing this can make your breasts produce more milk. °· If you become weak or lightheaded, or you feel like you might faint, ask for help before: °? Getting out of bed. °? Showering. °· Change your sanitary pads frequently. Watch for any changes in your flow, such as a sudden increase in volume, a change in color, the passing of large blood clots. If you pass a blood clot from your vagina, save it   to show to your health care provider. Do not flush blood clots down the toilet without having your health care provider look at them. °· Make sure that all your vaccinations are up to date. This can help protect you and your baby from getting certain diseases. You may need to have immunizations done before you leave the hospital. °· If desired, talk with your health care provider about methods of family planning or birth control (contraception). °How can I start bonding with my baby? °Spending as much time as  possible with your baby is very important. During this time, you and your baby can get to know each other and develop a bond. Having your baby stay with you in your room (rooming in) can give you time to get to know your baby. Rooming in can also help you become comfortable caring for your baby. Breastfeeding can also help you bond with your baby. °How can I plan for returning home with my baby? °· Make sure that you have a car seat installed in your vehicle. °? Your car seat should be checked by a certified car seat installer to make sure that it is installed safely. °? Make sure that your baby fits into the car seat safely. °· Ask your health care provider any questions you have about caring for yourself or your baby. Make sure that you are able to contact your health care provider with any questions after leaving the hospital. °This information is not intended to replace advice given to you by your health care provider. Make sure you discuss any questions you have with your health care provider. °Document Released: 05/28/2007 Document Revised: 01/03/2016 Document Reviewed: 07/05/2015 °Elsevier Interactive Patient Education © 2018 [employer] ° °

## 2018-07-17 ENCOUNTER — Ambulatory Visit: Payer: Self-pay

## 2018-07-17 NOTE — Lactation Note (Signed)
This note was copied from a baby's chart. 07/17/2018  Name: Sandra Krueger MRN: [id number] Date of Birth: 06/29/2018 Gestational Age: Gestational Age: [redacted]w[redacted]d Birth Weight: 123 oz Weight today:    8 pounds 7.1 ounces (3830 grams) with clean newborn diaper  64 week old infant presents today with mom for feeding assessment. Mom is concerned infant has not stooled since Friday.   Mom reports infant was stooling well until mom switched formula to Similac formula and has not stooled as frequently. Infant has not stooled in the last 5 days. Infant has been seen by Ped last Wednesday and on Sunday and was given information on how to help infant stool. Mom reports they have tried tummy time, bicycling legs, warm compresses to stomach, massage of stomach, warm cloth to rectum and Karo Syrup. Infant formula was changed to Nutramagin due to family history of Lactose intolerance. Infant is not receiving much formula (4 bottles in the last week). Mom reports infant has been spitting up a lot more since not stooling and is spitting out her mouth and nose frequently.   Infant with compressible nondistended abdomen. Infant is very gassy per mom.   Infant feeds at the breast with feeding cues. Often both breasts with each feeding. Mom reports infant feeds frequently. Infant gets an occasional bottles when mom asleep or nipples are tender.   Mom reports she pumped with her pump and her nipple got stuck way down in the pump barrell. Suggested Olive Oil for lubrication, decreasing suction and trying a different flange size. Reviewed how to tell flange size is correct.   Mom's older child and dad is Lactose intolerant. Mom is allergic to nuts and coconut.   Infant chokes on her bottle (Cozymom bottles) mom paces her feeding. Enc mom to try Dr. Saul Fordyce, Nuk Natural Flow or Tommie Tippee extra slow flow nipples are the slower flow nipples on the market. Mom has manual pump at home  Mom to call Pediatrician to  follow up about stooling. Mom called Peds office while in the office and is to see them today. Infant to follow up with Lactation as needed.   General Information: Mother's reason for visit: infant constipation Consult: Initial Lactation consultant: Nonah Mattes RN,IBCLC Breastfeeding experience: infant BF well. mom reports she gave some formula and infant not stooled in 5 days   Maternal medications: Pre-natal vitamin, Motrin (ibuprofen), Other(Muscle Relaxers for Sciatic Nerve pain, not currently taking die to BF)  Breastfeeding History: Frequency of breast feeding: every 2-3 hours Duration of feeding: 10 + minutes, both breast  Supplementation: Supplement method: bottle(Cozymom bottle) Brand: Nutramigen LIPIL Formula volume: 2-4 ounces Formula frequency: 4 x in the last 7 days         Pump type: Other(Medela Manual and Cozymom DEBP) Pump frequency: not pumping    Infant Output Assessment: Voids per 24 hours: 8+ Urine color: Clear yellow Stools per 24 hours: 0 stool in the last 5 days    Breast Assessment: Breast: Soft, Compressible Nipple: Erect Pain level: 0 Pain interventions: Bra(allergic to coconut oil)  Feeding Assessment: Infant oral assessment: Variance Infant oral assessment comment: Infant with thick labial frenulum that inserts at the bottom of the gum ridge, upper lip flanges well. Infant with good tongue mobility Positioning: Cross cradle(left breast) Latch: 2 - Grasps breast easily, tongue down, lips flanged, rhythmical sucking. Audible swallowing: 2 - Spontaneous and intermittent Type of nipple: 2 - Everted at rest and after stimulation Comfort: 2 - Soft/non-tender Hold: 2 -  No assistance needed to correctly position infant at breast LATCH score: 10 Latch assessment: Deep Lips flanged: Yes Suck assessment: Displays both   Pre-feed weight: 3830 grams Post feed weight: 3858 grams Amount transferred: 28 ml Amount supplemented: 0  Additional  Feeding Assessment:                                    Totals: Total amount transferred: 28 ml Total supplement given: 0 Total amount pumped post feed: did not pump here   Plan:  1. Offer infant breast with feeding cues, allow infant to feed as long as she wants 2. Keep infant awake at the breast as needed 3. Massage/compress breast with feeding 4. Empty the first breast before offering the second breast 5. Offer infant a bottle of breast milk or formula if not breast feeding 6. Infant needs about 71-95 ml (2.5-3 ounces) for 8 feeds a day or 570-760 ml (19-25 ounces) in 24 hours. Infant may take more or less depending on how often she feeds. Feed infant until she is satisfied.  7. It is recommended that you pump any time infant is receiving a bottle to protect milk supply 8. Call your pump company to see if you can get a different size flange for your pump 9. Call Pediatrician about not stooling 10. Keep up the good work 53. Call for questions/concerns as needed [phone] 12. Follow up with Lactation as needed    Donn Pierini RN, IBCLC                                                    .   Debby Freiberg Carman Essick 07/17/2018, 10:45 AM

## 2018-08-05 ENCOUNTER — Ambulatory Visit (INDEPENDENT_AMBULATORY_CARE_PROVIDER_SITE_OTHER): Payer: Medicaid Other | Admitting: Advanced Practice Midwife

## 2018-08-05 ENCOUNTER — Encounter: Payer: Self-pay | Admitting: Advanced Practice Midwife

## 2018-08-05 DIAGNOSIS — Z3009 Encounter for other general counseling and advice on contraception: Secondary | ICD-10-CM

## 2018-08-05 DIAGNOSIS — Z1389 Encounter for screening for other disorder: Secondary | ICD-10-CM | POA: Diagnosis not present

## 2018-08-05 DIAGNOSIS — M5432 Sciatica, left side: Secondary | ICD-10-CM

## 2018-08-05 MED ORDER — DROSPIRENONE 4 MG PO TABS: 1.0000 | ORAL_TABLET | Freq: Every day | ORAL | 11 refills | Status: AC

## 2018-08-05 MED ORDER — PRENATAL VITAMINS 0.8 MG PO TABS: 1.0000 | ORAL_TABLET | Freq: Every day | ORAL | 12 refills | Status: DC

## 2018-08-05 NOTE — Progress Notes (Signed)
Subjective:     Sandra Krueger is a 32 y.o. female who presents for a postpartum visit. She is 5 weeks postpartum following a spontaneous vaginal delivery. I have fully reviewed the prenatal and intrapartum course. The delivery was at 39/5 gestational weeks. Outcome: spontaneous vaginal delivery. Anesthesia: none. Postpartum course has been uncomplicated. Baby's course has been uncomplicated. Baby is feeding by breast. Bleeding no bleeding. Bowel function is abnormal: little constipation. Bladder function is abnormal: cant hold urine long. Patient is sexually active. Contraception method is none. Postpartum depression screening: negative.  Last pap: 12/14/2017: NIL   The following portions of the patient's history were reviewed and updated as appropriate: allergies, current medications, past family history, past medical history, past social history, past surgical history and problem list.  Review of Systems Pertinent items are noted in HPI.   Objective:    BP 114/77   Pulse 71   Ht 5\' 4"  (1.626 m)   Wt 217 lb (98.4 kg)   Breastfeeding Yes   BMI 37.25 kg/m    Physical Exam Vitals signs and nursing note reviewed.  Constitutional:      General: She is not in acute distress. HENT:     Head: Normocephalic.  Cardiovascular:     Rate and Rhythm: Normal rate.  Pulmonary:     Effort: Pulmonary effort is normal.  Abdominal:     Palpations: Abdomen is soft.     Tenderness: There is no abdominal tenderness.  Skin:    General: Skin is warm and dry.  Neurological:     Mental Status: She is alert and oriented to person, place, and time.  Psychiatric:        Mood and Affect: Mood normal.    Reviewed with the patient all forms of birth control options available including abstinence; over the counter/barrier methods; hormonal contraceptive medication including pill, patch, ring, Depo-Provera injection, Nexplanon; Mirena/Liletta and Paragard IUDs; permanent sterilization options including  vasectomy, tubal ligation. Risks and benefits reviewed.  Questions were answered.  Information was given to patient to review.   Assessment:   1. Postpartum care and examination   2. General counselling and advice on contraception   3. Sciatica of left side    Plan:   RX: slynd 28 #1 with 11 RF Amb referral to PT for sciatica    Marcille Buffy DNP, CNM  08/05/18  2:06 PM

## 2018-08-05 NOTE — Patient Instructions (Addendum)
Primary care follow up  [facility] (will see you even if you do not have sickle cell): [facility]: [facility]: [place] [phone]  Drospirenone tablets (contraception) What is this medicine? DROSPIRENONE (dro SPY re nown) is an oral contraceptive (birth control pill). The product contains a female hormone known as a progestin. It is used to prevent pregnancy. This medicine may be used for other purposes; ask your health care provider or pharmacist if you have questions. COMMON BRAND NAME(S): SLYND What should I tell my health care provider before I take this medicine? They need to know if you have any of these conditions: -abnormal vaginal bleeding -adrenal gland disease -blood vessel disease or blood clots -breast, cervical, endometrial, ovarian, liver, or uterine cancer -diabetes -heart disease or recent heart attack -high potassium level -kidney disease -liver disease -mental depression -migraine headaches -stroke -an unusual or allergic reaction to drospirenone, progestins, or other medicines, foods, dyes, or preservatives -pregnant or trying to get pregnant -breast-feeding How should I use this medicine? Take this medicine by mouth. To reduce nausea, this medicine may be taken with food. Follow the directions on the prescription label. Take this medicine at the same time each day and in the order directed on the package. Do not take your medicine more often than directed. A patient package insert for the product will be given with each prescription and refill. Read this sheet carefully each time. The sheet may change frequently. Talk to your pediatrician regarding the use of this medicine in children. Special care may be needed. This medicine has been used in female children who have started having menstrual periods. Overdosage: If you think you have taken too much of this  medicine contact a poison control center or emergency room at once. NOTE: This medicine is only for you. Do not share this medicine with others. What if I miss a dose? If you miss a dose, take it as soon as you can and refer to the patient information sheet you received with your medicine for direction. If you miss more than one pill, this medicine may not be as effective and you may need to use another form of birth control. What may interact with this medicine? Do not take this medicine with any of the following medications: -atazanavir; cobicistat -bosentan -fosamprenavir This medicine may also interact with the following medications: -aprepitant -barbiturates like phenobarbital, primidone -carbamazepine -certain antibiotics like clarithromycin, rifampin, rifabutin, rifapentine -certain antivirals for HIV or hepatitis -certain diuretics like amiloride, spironolactone, triamterene -certain medicines for fungal infections like griseofulvin, ketoconazole, itraconazole, voriconazole -certain medicines for blood pressure, heart disease -cyclosporine -felbamate -heparin -medicines for diabetes -modafinil -NSAIDs, medicines for pain and inflammation, like ibuprofen or naproxen -oxcarbazepine -phenytoin -potassium supplements -rufinamide -St. John's wort -topiramate This list may not describe all possible interactions. Give your health care provider a list of all the medicines, herbs, non-prescription drugs, or dietary supplements you use. Also tell them if you smoke, drink alcohol, or use illegal drugs. Some items may interact with your medicine. What should I watch for while using this medicine? Visit your doctor or health care professional for regular checks on your progress. You will need a regular breast and pelvic exam and Pap smear while on this medicine. You may need blood work done while you are taking this medicine. If you have any reason to think you are pregnant, stop taking  this medicine right away and contact your doctor or health  care professional. This medicine does not protect you against HIV infection (AIDS) or any other sexually transmitted diseases. If you are going to have elective surgery, you may need to stop taking this medicine before the surgery. Consult your health care professional for advice. What side effects may I notice from receiving this medicine? Side effects that you should report to your doctor or health care professional as soon as possible: -allergic reactions like skin rash, itching or hives, swelling of the face, lips, or tongue -breast tissue changes or discharge -depressed mood -severe pain, swelling, or tenderness in the abdomen -signs and symptoms of a blood clot such as chest pain; shortness of breath; pain, swelling, or warmth in the leg -signs and symptoms of increased potassium like muscle weakness; chest pain; or fast, irregular heartbeat -signs and symptoms of liver injury like dark yellow or brown urine; general ill feeling or flu-like symptoms; light-colored stools; loss of appetite; nausea; right upper belly pain; unusually weak or tired; yellowing of the eyes or skin -signs and symptoms of a stroke like changes in vision; confusion; trouble speaking or understanding; severe headaches; sudden numbness or weakness of the face, arm or leg; trouble walking; dizziness; loss of balance or coordination -unusual vaginal bleeding -unusually weak or tired Side effects that usually do not require medical attention (report these to your doctor or health care professional if they continue or are bothersome): -acne -breast tenderness -headache -menstrual cramps -nausea -weight gain This list may not describe all possible side effects. Call your doctor for medical advice about side effects. You may report side effects to FDA at 1-800-FDA-1088. Where should I keep my medicine? Keep out of the reach of children. Store at room  temperature between 20 and 25 degrees C (68 and 77 degrees F). Throw away any unused medicine after the expiration date. NOTE: This sheet is a summary. It may not cover all possible information. If you have questions about this medicine, talk to your doctor, pharmacist, or health care provider.  2019 Elsevier/Gold Standard (2018-01-09 15:01:56)

## 2018-08-05 NOTE — Progress Notes (Signed)
Pt undecided about Birth Control

## 2018-08-05 NOTE — Progress Notes (Signed)
PTs physical Therapy scheduled for 08/08/18 @ 8:45am.

## 2018-08-08 ENCOUNTER — Ambulatory Visit: Payer: Medicaid Other | Admitting: Physical Therapy

## 2018-08-20 ENCOUNTER — Other Ambulatory Visit: Payer: Self-pay

## 2018-08-20 ENCOUNTER — Ambulatory Visit: Payer: Medicaid Other | Attending: Advanced Practice Midwife | Admitting: Physical Therapy

## 2018-08-20 ENCOUNTER — Encounter: Payer: Self-pay | Admitting: Physical Therapy

## 2018-08-20 DIAGNOSIS — M5432 Sciatica, left side: Secondary | ICD-10-CM

## 2018-08-20 DIAGNOSIS — R262 Difficulty in walking, not elsewhere classified: Secondary | ICD-10-CM | POA: Diagnosis present

## 2018-08-20 NOTE — Therapy (Signed)
[place], [place], [address] Phone: [phone]   Fax:  [phone]  Physical Therapy Evaluation  Patient Details  Name: Sandra Krueger MRN: [id number] Date of Birth: [birth year] Referring Provider (PT): Tresea Mall, CNM   Encounter Date: 08/20/2018  PT End of Session - 08/20/18 0943    Visit Number  1    Authorization Type  MCD- waiting for auth    PT Start Time  4097   pt arrived late   PT Stop Time  1012    PT Time Calculation (min)  33 min    Activity Tolerance  Patient tolerated treatment well    Behavior During Therapy  [facility] for tasks assessed/performed       Past Medical History:  Diagnosis Date  . Asthma   . Fibroids   . Headache   . Panic attack    last episode approx two months ago per pt    Past Surgical History:  Procedure Laterality Date  . OTHER SURGICAL HISTORY     fibroids removed  . OVARY SURGERY     cyst removed from ? right ovary    There were no vitals filed for this visit.   Subjective Assessment - 08/20/18 0943    Subjective  Vaginal delivery Nov 16. 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 I am losing my balance now, leg still goes numb while seated. Unable to walk through grocery store due to incr numbness and LOB. Throbbing sensation in Lt buttock and lateral hip. Maybe 2 incidence of happening on Right. Has 2 kids.     Patient Stated Goals  decr pain, walk, child care    Currently in Pain?  No/denies         [facility] PT Assessment - 08/20/18 0001      Assessment   Medical Diagnosis  Lt-sided sciatica    Referring Provider (PT)  Tresea Mall, CNM    Onset Date/Surgical Date  02/11/18    Hand Dominance  Right    Prior Therapy  no      Precautions   Precautions  Fall      Restrictions   Weight Bearing Restrictions  No      Balance Screen   Has the patient fallen in the past 6  months  Yes    Has the patient had a decrease in activity level because of a fear of falling?   No    Is the patient reluctant to leave their home because of a fear of falling?   No      Home Film/video editor residence    Living Arrangements  Children      Prior Function   Level of Independence  Independent    Vocation Requirements  unsure at this time      Cognition   Overall Cognitive Status  Within Functional Limits for tasks assessed      Palpation   Palpation comment  Lt post innom rot      Ambulation/Gait   Gait Comments  antalgic on Lt, slow cadence                Objective measurements completed on examination: See above findings.      [facility] Adult PT Treatment/Exercise - 08/20/18 0001      Exercises   Exercises  Knee/Hip  Knee/Hip Exercises: Stretches   Other Knee/Hip Stretches  LTR      Knee/Hip Exercises: Seated   Clamshell with TheraBand  Green      Knee/Hip Exercises: Supine   Other Supine Knee/Hip Exercises  hooklying adduction    Other Supine Knee/Hip Exercises  PPT      Manual Therapy   Manual Therapy  Muscle Energy Technique    Muscle Energy Technique  Lt flexors/Rt extensors             PT Education - 08/20/18 1449    Education Details  anatomy of condition, POC, HEP, exercise form/rationale, MCD auth    Person(s) Educated  Patient    Methods  Explanation;Demonstration;Tactile cues;Verbal cues;Handout    Comprehension  Verbalized understanding;Need further instruction;Returned demonstration;Verbal cues required;Tactile cues required       PT Short Term Goals - 08/20/18 1449      PT SHORT TERM GOAL #1   Title  Pt will demo proper core control for HEP as it has been established in the short term.     Baseline  began at eval    Time  3    Period  Weeks    Status  New    Target Date  09/13/18      PT SHORT TERM GOAL #2   Title  pt will verbalize use of proper postures through the day     Baseline  began educating at eval    Time  3    Period  Weeks    Status  New    Target Date  09/13/18        PT Long Term Goals - 08/20/18 1453      PT LONG TERM GOAL #1   Title  Pt will be able to complete all child care activities without limitation by LLE    Baseline  significantly limited at eval    Time  10   due to MCD auth periods   Period  Weeks    Status  New    Target Date  11/01/18      PT LONG TERM GOAL #2   Title  Pt will be able to ambulate for community distances without limitation by LLE    Baseline  unable to walk grocery store at eval    Time  10    Period  Weeks    Status  New    Target Date  11/01/18      PT LONG TERM GOAL #3   Title  gross strength in hips 5/5 for proper support to lumbopelvic region    Baseline  not appropriate to test at eval due to pelvic rotation    Time  10    Period  Weeks    Status  New    Target Date  11/01/18      PT LONG TERM GOAL #4   Title  pt will be independent in all transfers    Baseline  requires help at times due to numbness in LLE    Time  10    Period  Weeks    Status  New    Target Date  11/01/18             Plan - 08/20/18 1456    Clinical Impression Statement  Pt presents to PT with complaints of LBP and LLE numbness that began during pregnancy and has continued post partum. Functional limitations as well as child care limitations are presented by this  issue. MMT not taken due to pelvic rotation noted at eval but weakness evident as correction was easy and then had difficulty performing core contraction. Discussed the proper way to carry infant car seat as well as importance of posture. Pt was able to stand independently from supine after treatment today. Pt verbalized a continued LOB with and without N/T and has fallen recently. Will continue to benefit from skilled PT in order to reach long term functional goals.     History and Personal Factors relevant to plan of care:  2 mo post partum- complicated  pregnancy    Clinical Presentation  Unstable    Clinical Presentation due to:  fluctuating distal symptoms    Clinical Decision Making  Moderate    PT Frequency  --   3 visits in first auth followed by 2/week 6 weeks as auth by [facility]   PT Treatment/Interventions  ADLs/Self Care Home Management;Cryotherapy;Electrical Stimulation;Ultrasound;Traction;Moist Heat;Gait training;Stair training;Functional mobility training;Therapeutic activities;Therapeutic exercise;Balance training;Patient/family education;Neuromuscular re-education;Manual techniques;Passive range of motion;Taping;Dry needling    PT Next Visit Plan  check pelvic rotation, stability    PT Home Exercise Plan  TrA engagement, iso hip add, feet flat on floor    Recommended Other Services  possible pelvic floor PT    Consulted and Agree with Plan of Care  Patient       Patient will benefit from skilled therapeutic intervention in order to improve the following deficits and impairments:  Impaired sensation, Improper body mechanics, Pain, Postural dysfunction, Decreased activity tolerance, Decreased endurance, Decreased strength, Difficulty walking, Decreased balance  Visit Diagnosis: Sciatica, left side - Plan: PT plan of care cert/re-cert  Difficulty in walking, not elsewhere classified - Plan: PT plan of care cert/re-cert     Problem List Patient Active Problem List   Diagnosis Date Noted  . Indication for care in labor or delivery 06/29/2018  . SVD (11/16) 06/29/2018  . False labor 06/25/2018  . Supervision of other normal pregnancy, antepartum 05/01/2018    Tymeer Vaquera C. Prince Olivier PT, DPT 08/20/18 3:07 PM   [facility] [address] [place], [place], [address] Phone: [phone]   Fax:  [phone]  Name: Sandra Krueger MRN: [id number] Date of Birth: 15-Mar-1986

## 2018-08-27 ENCOUNTER — Encounter: Payer: Self-pay | Admitting: Physical Therapy

## 2018-08-27 ENCOUNTER — Ambulatory Visit: Payer: Medicaid Other | Admitting: Physical Therapy

## 2018-08-27 DIAGNOSIS — M5432 Sciatica, left side: Secondary | ICD-10-CM

## 2018-08-27 DIAGNOSIS — R262 Difficulty in walking, not elsewhere classified: Secondary | ICD-10-CM

## 2018-08-27 NOTE — Therapy (Signed)
[place], [place], [address] Phone: [phone]   Fax:  [phone]  Physical Therapy Treatment  Patient Details  Name: Sandra Krueger MRN: [id number] Date of Birth: 16-Sep-1985 Referring Provider (PT): Tresea Mall, CNM   Encounter Date: 08/27/2018  PT End of Session - 08/27/18 1157    Visit Number  2    Authorization Type  MCD    Authorization Time Period  1/13-2/2    Authorization - Visit Number  1    Authorization - Number of Visits  3    PT Start Time  1157   pt arrived late   PT Stop Time  1240    PT Time Calculation (min)  43 min    Activity Tolerance  Patient tolerated treatment well    Behavior During Therapy  [facility] for tasks assessed/performed       Past Medical History:  Diagnosis Date  . Asthma   . Fibroids   . Headache   . Panic attack    last episode approx two months ago per pt    Past Surgical History:  Procedure Laterality Date  . OTHER SURGICAL HISTORY     fibroids removed  . OVARY SURGERY     cyst removed from ? right ovary    There were no vitals filed for this visit.  Subjective Assessment - 08/27/18 1201    Subjective  Decr sensation down leg since last visit. Hips hurt yesterday after a lot of walking. balance still feels a little off.     Currently in Pain?  Yes    Pain Score  3     Pain Location  Hip    Pain Orientation  Left    Pain Descriptors / Indicators  Aching                       [facility] Adult PT Treatment/Exercise - 08/27/18 0001      Knee/Hip Exercises: Standing   Wall Squat  10 reps    Wall Squat Limitations  holding baby    Gait Training  posture and control of hips    Other Standing Knee Exercises  PPT at wall holding baby    Other Standing Knee Exercises  core engagement at resting standing posture holding baby      Knee/Hip Exercises: Seated   Ball Squeeze  +PPT+UE horiz abd    Other Seated Knee/Hip Exercises  post pelvic  tilt ball bw knees      Modalities   Modalities  Cryotherapy      Cryotherapy   Number Minutes Cryotherapy  10 Minutes    Cryotherapy Location  Lumbar Spine    Type of Cryotherapy  Ice pack      Manual Therapy   Muscle Energy Technique  Lt flexors/Rt extensors               PT Short Term Goals - 08/20/18 1449      PT SHORT TERM GOAL #1   Title  Pt will demo proper core control for HEP as it has been established in the short term.     Baseline  began at eval    Time  3    Period  Weeks    Status  New    Target Date  09/13/18      PT SHORT TERM GOAL #2   Title  pt will verbalize use of proper postures through the day  Baseline  began educating at eval    Time  3    Period  Weeks    Status  New    Target Date  09/13/18        PT Long Term Goals - 08/20/18 1453      PT LONG TERM GOAL #1   Title  Pt will be able to complete all child care activities without limitation by LLE    Baseline  significantly limited at eval    Time  10   due to MCD auth periods   Period  Weeks    Status  New    Target Date  11/01/18      PT LONG TERM GOAL #2   Title  Pt will be able to ambulate for community distances without limitation by LLE    Baseline  unable to walk grocery store at eval    Time  10    Period  Weeks    Status  New    Target Date  11/01/18      PT LONG TERM GOAL #3   Title  gross strength in hips 5/5 for proper support to lumbopelvic region    Baseline  not appropriate to test at eval due to pelvic rotation    Time  10    Period  Weeks    Status  New    Target Date  11/01/18      PT LONG TERM GOAL #4   Title  pt will be independent in all transfers    Baseline  requires help at times due to numbness in LLE    Time  10    Period  Weeks    Status  New    Target Date  11/01/18            Plan - 08/27/18 1245    Clinical Impression Statement  Pt reported improved symtpoms after eval. Had baby with her that wanted to be held so we focused on  postures and exercises while holding her to use at home. pt reported feeling a little bit better after treatment.     PT Treatment/Interventions  ADLs/Self Care Home Management;Cryotherapy;Electrical Stimulation;Ultrasound;Traction;Moist Heat;Gait training;Stair training;Functional mobility training;Therapeutic activities;Therapeutic exercise;Balance training;Patient/family education;Neuromuscular re-education;Manual techniques;Passive range of motion;Taping;Dry needling    PT Next Visit Plan  check pelvic rotation, stability    PT Home Exercise Plan  TrA engagement, iso hip add, feet flat on floor, posture with baby    Consulted and Agree with Plan of Care  Patient       Patient will benefit from skilled therapeutic intervention in order to improve the following deficits and impairments:  Impaired sensation, Improper body mechanics, Pain, Postural dysfunction, Decreased activity tolerance, Decreased endurance, Decreased strength, Difficulty walking, Decreased balance  Visit Diagnosis: Sciatica, left side  Difficulty in walking, not elsewhere classified     Problem List Patient Active Problem List   Diagnosis Date Noted  . Indication for care in labor or delivery 06/29/2018  . SVD (11/16) 06/29/2018  . False labor 06/25/2018  . Supervision of other normal pregnancy, antepartum 05/01/2018    Shawnika Pepin C. Krystn Dermody PT, DPT 08/27/18 12:51 PM   [facility] [address] [place], [place], [address] Phone: [phone]   Fax:  [phone]  Name: Sandra Krueger MRN: [id number] Date of Birth: 02/06/1986

## 2018-09-06 ENCOUNTER — Encounter: Payer: Self-pay | Admitting: Physical Therapy

## 2018-09-06 ENCOUNTER — Ambulatory Visit: Payer: Medicaid Other | Admitting: Physical Therapy

## 2018-09-06 DIAGNOSIS — M5432 Sciatica, left side: Secondary | ICD-10-CM | POA: Diagnosis not present

## 2018-09-06 DIAGNOSIS — R262 Difficulty in walking, not elsewhere classified: Secondary | ICD-10-CM

## 2018-09-06 NOTE — Therapy (Signed)
Sandra Krueger, [place], [address] Phone: [phone]   Fax:  [phone]  Physical Therapy Treatment  Patient Details  Name: Sandra Krueger MRN: [id number] Date of Birth: 12/24/1985 Referring Provider (PT): Tresea Mall, CNM   Encounter Date: 09/06/2018  PT End of Session - 09/06/18 0940    Visit Number  3    Authorization Type  MCD    Authorization Time Period  1/13-2/2    Authorization - Visit Number  2    Authorization - Number of Visits  3    PT Start Time  0940   pt arrived late   PT Stop Time  1018    PT Time Calculation (min)  38 min    Activity Tolerance  Patient tolerated treatment well    Behavior During Therapy  [facility] for tasks assessed/performed       Past Medical History:  Diagnosis Date  . Asthma   . Fibroids   . Headache   . Panic attack    last episode approx two months ago per pt    Past Surgical History:  Procedure Laterality Date  . OTHER SURGICAL HISTORY     fibroids removed  . OVARY SURGERY     cyst removed from ? right ovary    There were no vitals filed for this visit.  Subjective Assessment - 09/06/18 0941    Subjective  I tried using postural changes. It has been a rough week with daughter getting shots. Denies radicular symptoms. It's okay    Patient Stated Goals  decr pain, walk, child care    Currently in Pain?  Yes    Pain Score  2     Pain Location  Hip    Pain Orientation  Left    Pain Descriptors / Indicators  Sore                       [facility] Adult PT Treatment/Exercise - 09/06/18 0001      Knee/Hip Exercises: Aerobic   Nustep  5 min L7 UE & LE      Knee/Hip Exercises: Standing   Functional Squat Limitations  squat from bar glut squeeze to stand      Knee/Hip Exercises: Supine   Hip Adduction Isometric Limitations  adduction+PPT    Other Supine Knee/Hip Exercises  hooklying press into physioball for core contraction      Knee/Hip  Exercises: Prone   Other Prone Exercises  attempted wall plank and qped core contraction- unable      Manual Therapy   Manual Therapy  Soft tissue mobilization    Soft tissue mobilization  roller bil quads    Muscle Energy Technique  Lt flexors/Rt extensors             PT Education - 09/06/18 1023    Education Details  transition to pelvic floor rehab    Person(s) Educated  Patient    Methods  Explanation    Comprehension  Verbalized understanding;Need further instruction       PT Short Term Goals - 08/20/18 1449      PT SHORT TERM GOAL #1   Title  Pt will demo proper core control for HEP as it has been established in the short term.     Baseline  began at eval    Time  3    Period  Weeks    Status  New    Target Date  09/13/18      PT SHORT TERM GOAL #2   Title  pt will verbalize use of proper postures through the day    Baseline  began educating at eval    Time  3    Period  Weeks    Status  New    Target Date  09/13/18        PT Long Term Goals - 08/20/18 1453      PT LONG TERM GOAL #1   Title  Pt will be able to complete all child care activities without limitation by LLE    Baseline  significantly limited at eval    Time  10   due to MCD auth periods   Period  Weeks    Status  New    Target Date  11/01/18      PT LONG TERM GOAL #2   Title  Pt will be able to ambulate for community distances without limitation by LLE    Baseline  unable to walk grocery store at eval    Time  10    Period  Weeks    Status  New    Target Date  11/01/18      PT LONG TERM GOAL #3   Title  gross strength in hips 5/5 for proper support to lumbopelvic region    Baseline  not appropriate to test at eval due to pelvic rotation    Time  10    Period  Weeks    Status  New    Target Date  11/01/18      PT LONG TERM GOAL #4   Title  pt will be independent in all transfers    Baseline  requires help at times due to numbness in LLE    Time  10    Period  Weeks     Status  New    Target Date  11/01/18            Plan - 09/06/18 1020    Clinical Impression Statement  pelvic rotation notable again today and corrected with MET. Soreness in bilateral quads reduced with rolling. no longer complaining of symptoms of sciatic compression. multiple positions utilized for pt to achieve TrA contraction but was able with hooklying ball press. provided with referral for pelvic floor rehab to be signed by MD     PT Treatment/Interventions  ADLs/Self Care Home Management;Cryotherapy;Electrical Stimulation;Ultrasound;Traction;Moist Heat;Gait training;Stair training;Functional mobility training;Therapeutic activities;Therapeutic exercise;Balance training;Patient/family education;Neuromuscular re-education;Manual techniques;Passive range of motion;Taping;Dry needling    PT Next Visit Plan  cont with core engagement, pelvic stability    PT Home Exercise Plan  TrA engagement, iso hip add, feet flat on floor, posture with baby    Consulted and Agree with Plan of Care  Patient       Patient will benefit from skilled therapeutic intervention in order to improve the following deficits and impairments:  Impaired sensation, Improper body mechanics, Pain, Postural dysfunction, Decreased activity tolerance, Decreased endurance, Decreased strength, Difficulty walking, Decreased balance  Visit Diagnosis: Sciatica, left side  Difficulty in walking, not elsewhere classified     Problem List Patient Active Problem List   Diagnosis Date Noted  . Indication for care in labor or delivery 06/29/2018  . SVD (11/16) 06/29/2018  . False labor 06/25/2018  . Supervision of other normal pregnancy, antepartum 05/01/2018    Sandra Krueger PT, DPT 09/06/18 10:23 AM   [facility]  [place], [place], [address] Phone: [phone]   Fax:  [phone]  Name: Sandra Krueger MRN: [id number] Date of Birth:  [birth year]

## 2018-09-13 ENCOUNTER — Encounter: Payer: Self-pay | Admitting: Physical Therapy

## 2018-09-13 ENCOUNTER — Ambulatory Visit: Payer: Medicaid Other | Admitting: Physical Therapy

## 2018-09-13 DIAGNOSIS — R262 Difficulty in walking, not elsewhere classified: Secondary | ICD-10-CM

## 2018-09-13 DIAGNOSIS — M5432 Sciatica, left side: Secondary | ICD-10-CM | POA: Diagnosis not present

## 2018-09-13 NOTE — Therapy (Addendum)
[place], [place], [address] Phone: [phone]   Fax:  [phone]  Physical Therapy Treatment  Patient Details  Name: Sandra Krueger MRN: [id number] Date of Birth: 10-28-1985 Referring Provider (PT): Tresea Mall, CNM   Encounter Date: 09/13/2018  PT End of Session - 09/13/18 5400    Visit Number  4    Number of Visits 11    Date for PT Re-Evaluation  10/11/18    Authorization Type  MCD, requesting more visits on 09/13/2018    Authorization Time Period  1/13-2/2    Authorization - Visit Number  3    Authorization - Number of Visits  3    PT Start Time  0938   in late   PT Stop Time  1005   had to leave early for daughters MD appointment   PT Time Calculation (min)  27 min    Activity Tolerance  Patient tolerated treatment well       Past Medical History:  Diagnosis Date  . Asthma   . Fibroids   . Headache   . Panic attack    last episode approx two months ago per pt    Past Surgical History:  Procedure Laterality Date  . OTHER SURGICAL HISTORY     fibroids removed  . OVARY SURGERY     cyst removed from ? right ovary    There were no vitals filed for this visit.  Subjective Assessment - 09/13/18 0939    Subjective  No more back and shoooting pain in her leg however ever since her last visit her Rt knee has been killing her. She has not gotten her referral for pelvic health PT turned in yet.     Currently in Pain?  Yes    Pain Score  4     Pain Location  Knee    Pain Orientation  Right    Pain Type  Acute pain    Pain Onset  In the past 7 days    Pain Frequency  Constant    Aggravating Factors   standing and walking    Pain Relieving Factors  getting off the leg.          [facility] PT Assessment - 09/13/18 0001      Assessment   Medical Diagnosis  Lt-sided sciatica    Referring Provider (PT)  Tresea Mall, CNM    Onset Date/Surgical Date  02/11/18      ROM / Strength    AROM / PROM / Strength  AROM;Strength      AROM   AROM Assessment Site  --      Strength   Strength Assessment Site  Hip;Knee;Ankle;Lumbar    Right/Left Hip  --   bilat grossly 4+/5,except Rt hip flex 4/5   Right/Left Knee  --   Lt 5/5, Rt 5-/5, pt has fair eccentric quad control   Right/Left Ankle  --   WNL   Lumbar Flexion  --   TA poor   Lumbar Extension  --   poor multifidi lumbar bilat     Flexibility   Soft Tissue Assessment /Muscle Length  yes    Quadriceps  prone knee flex Lt 145, Rt 140                   [facility] Adult PT Treatment/Exercise - 09/13/18 0001      Self-Care   Self-Care  Heat/Ice Application    Heat/Ice  Application  recommend trying heat on Rt knee to see if this will settle it down.       Exercises   Exercises  Knee/Hip      Knee/Hip Exercises: Aerobic   Nustep  5 min L7 UE & LE      Knee/Hip Exercises: Prone   Other Prone Exercises  10x5 sec prone quad sets               PT Short Term Goals - 09/13/18 0945      PT SHORT TERM GOAL #1   Title  Pt will demo proper core control for HEP as it has been established in the short term.     Baseline  pt has been working on maintaining tight core and not hyperextending her knees in standing while rocking baby    Status  Achieved      PT SHORT TERM GOAL #2   Title  pt will verbalize use of proper postures through the day    Status  Achieved        PT Long Term Goals - 09/13/18 0946      PT LONG TERM GOAL #1   Title  Pt will be able to complete all child care activities without limitation by LLE and Rt LE     Baseline  Lt LE is fine now.  Now Rt knee is hurting her and limiting her ability to stand and carry her baby    Time  10    Period  Weeks    Status  Revised    Target Date  11/01/18      PT LONG TERM GOAL #2   Title  Pt will be able to ambulate for community distances without limitation by LLE and Rt LE     Baseline  Lt LE fine - limited now due to pain into Rt  knee with standing    Time  10    Status  Revised    Target Date  11/01/18      PT LONG TERM GOAL #3   Title  gross strength in hips 5/5 for proper support to lumbopelvic region    Baseline  making progress for legs, hips 4+/5 except Rt hip flex 4/5    Time  10    Period  Weeks    Status  On-going    Target Date  11/01/18      PT LONG TERM GOAL #4   Title  pt will be independent in all transfers    Status  Achieved      PT LONG TERM GOAL #5   Title  pt will demo strong contraction of TA and lumbar multifidi to stabilize pelvis during daily activiities     Baseline  poor contractions and difficulty engaging these muscles    Time  7    Period  Weeks    Status  New    Target Date  11/01/18      Additional Long Term Goals   Additional Long Term Goals  Yes      PT LONG TERM GOAL #6   Title  demo good eccentric control of quads in functional movements    Baseline  fair control with lowering into sit or squat    Time  7    Period  Weeks    Status  New    Target Date  11/01/18            Plan - 09/13/18 9233  Clinical Impression Statement  Pt is not having pain into her Lt LE any more, she is having a lot of Rt knee pain now that is limiting her ability to stand/walk and care for her children.  She has weakness in bilat hips and alot of weakness through her core leading to instability.  She is 3 months postpartum,  Her pelvis has remained in alignment for the last week. Sandra Krueger would benefit from continued skilled therapy to address her weakness to provide stability so she doesn't move out of alignment and to give stability through the lower body to protect knees and hips from injury.  She has met her short term goals and progressing to the long term goals with new ones added.     Rehab Potential  Good    PT Frequency  1x / week    PT Duration  4 weeks    PT Treatment/Interventions  ADLs/Self Care Home Management;Cryotherapy;Electrical Stimulation;Ultrasound;Traction;Moist  Heat;Gait training;Stair training;Functional mobility training;Therapeutic activities;Therapeutic exercise;Balance training;Patient/family education;Neuromuscular re-education;Manual techniques;Passive range of motion;Taping;Dry needling    PT Next Visit Plan  cont with core engagement, pelvic stability, move into functional positions    Consulted and Agree with Plan of Care  Patient       Patient will benefit from skilled therapeutic intervention in order to improve the following deficits and impairments:  Impaired sensation, Improper body mechanics, Pain, Postural dysfunction, Decreased activity tolerance, Decreased endurance, Decreased strength, Difficulty walking, Decreased balance  Visit Diagnosis: Sciatica, left side  Difficulty in walking, not elsewhere classified     Problem List Patient Active Problem List   Diagnosis Date Noted  . Indication for care in labor or delivery 06/29/2018  . SVD (11/16) 06/29/2018  . False labor 06/25/2018  . Supervision of other normal pregnancy, antepartum 05/01/2018    Jeral Pinch PT  09/13/2018, 12:07 PM  [facility] [address] [place], [place], [address] Phone: [phone]   Fax:  [phone]  Name: Sandra Krueger MRN: [id number] Date of Birth: 1985-10-10  PHYSICAL THERAPY DISCHARGE SUMMARY  Visits from Start of Care: 4  Current functional level related to goals / functional outcomes: Unknown - she had been progressing well at last visit   Remaining deficits: unknown   Education / Equipment: HEP Plan:                                                    Patient goals were partially met. Patient is being discharged due to not returning since the last visit.  ?????    Jeral Pinch, PT 10/16/18 11:44 AM

## 2018-09-20 ENCOUNTER — Ambulatory Visit: Payer: Medicaid Other | Admitting: Physical Therapy

## 2021-03-17 ENCOUNTER — Telehealth (INDEPENDENT_AMBULATORY_CARE_PROVIDER_SITE_OTHER): Payer: Medicaid Other | Admitting: *Deleted

## 2021-03-17 ENCOUNTER — Other Ambulatory Visit: Payer: Self-pay

## 2021-03-17 ENCOUNTER — Encounter: Payer: Self-pay | Admitting: *Deleted

## 2021-03-17 DIAGNOSIS — O98519 Other viral diseases complicating pregnancy, unspecified trimester: Secondary | ICD-10-CM

## 2021-03-17 DIAGNOSIS — O0990 Supervision of high risk pregnancy, unspecified, unspecified trimester: Secondary | ICD-10-CM | POA: Insufficient documentation

## 2021-03-17 DIAGNOSIS — O09529 Supervision of elderly multigravida, unspecified trimester: Secondary | ICD-10-CM | POA: Insufficient documentation

## 2021-03-17 DIAGNOSIS — O219 Vomiting of pregnancy, unspecified: Secondary | ICD-10-CM

## 2021-03-17 DIAGNOSIS — Z3A00 Weeks of gestation of pregnancy not specified: Secondary | ICD-10-CM

## 2021-03-17 DIAGNOSIS — O2310 Infections of bladder in pregnancy, unspecified trimester: Secondary | ICD-10-CM

## 2021-03-17 DIAGNOSIS — O99511 Diseases of the respiratory system complicating pregnancy, first trimester: Secondary | ICD-10-CM | POA: Insufficient documentation

## 2021-03-17 DIAGNOSIS — E669 Obesity, unspecified: Secondary | ICD-10-CM

## 2021-03-17 DIAGNOSIS — Z8616 Personal history of COVID-19: Secondary | ICD-10-CM | POA: Insufficient documentation

## 2021-03-17 DIAGNOSIS — O99210 Obesity complicating pregnancy, unspecified trimester: Secondary | ICD-10-CM | POA: Insufficient documentation

## 2021-03-17 DIAGNOSIS — J4520 Mild intermittent asthma, uncomplicated: Secondary | ICD-10-CM

## 2021-03-17 DIAGNOSIS — O2311 Infections of bladder in pregnancy, first trimester: Secondary | ICD-10-CM

## 2021-03-17 DIAGNOSIS — U071 COVID-19: Secondary | ICD-10-CM

## 2021-03-17 DIAGNOSIS — J45909 Unspecified asthma, uncomplicated: Secondary | ICD-10-CM | POA: Insufficient documentation

## 2021-03-17 HISTORY — DX: Personal history of COVID-19: Z86.16

## 2021-03-17 MED ORDER — PROMETHAZINE HCL 25 MG PO TABS: 25.0000 mg | ORAL_TABLET | Freq: Four times a day (QID) | ORAL | 0 refills | Status: DC | PRN

## 2021-03-17 MED ORDER — BLOOD PRESSURE KIT DEVI: 1.0000 | 0 refills | Status: DC | PRN

## 2021-03-17 MED ORDER — COMPLETENATE 29-1 MG PO CHEW: 1.0000 | CHEWABLE_TABLET | Freq: Every day | ORAL | 12 refills | Status: DC

## 2021-03-17 NOTE — Patient Instructions (Signed)
  At our [facility] OB/GYN Practices, we work as an integrated team, providing care to address both physical and emotional health. Your medical provider may refer you to see our [facility]) on the same day you see your medical provider, as availability permits; often scheduled virtually at your convenience.  Our [facility] is available to all patients, visits generally last between 20-30 minutes, but can be longer or shorter, depending on patient need. The [facility] offers help with stress management, coping with symptoms of depression and anxiety, major life changes , sleep issues, changing risky behavior, grief and loss, life stress, working on personal life goals, and  behavioral health issues, as these all affect your overall health and wellness.  The [facility] is NOT available for the following: FMLA paperwork, court-ordered evaluations, specialty assessments (custody or disability), letters to employers, or obtaining certification for an emotional support animal. The [facility] does not provide long-term therapy. You have the right to refuse integrated behavioral health services, or to reschedule to see the [facility] at a later date.  Confidentiality exception: If it is suspected that a child or disabled adult is being abused or neglected, we are required by law to report that to either Child Protective Services or Adult Scientist, forensic.  If you have a diagnosis of Bipolar affective disorder, Schizophrenia, or recurrent Major depressive disorder, we will recommend that you establish care with a psychiatrist, as these are lifelong, chronic conditions, and we want your overall emotional health and medications to be more closely monitored. If you anticipate needing extended maternity leave due to mental health issues postpartum, it it recommended you inform your medical provider, so we can put in a referral to a psychiatrist as soon as possible. The [facility] is unable to recommend an extended maternity leave for mental  health issues. Your medical provider or [facility] may refer you to a therapist for ongoing, traditional therapy, or to a psychiatrist, for medication management, if it would benefit your overall health. Depending on your insurance, you may have a copay or be charged a deductible, depending on your insurance, to see the [facility]. If you are uninsured, it is recommended that you apply for financial assistance. (Forms may be requested at the front desk for in-person visits, via MyChart, or request a form during a virtual visit).  If you see the [facility] more than 6 times, you will have to complete a comprehensive clinical assessment interview with the [facility] to resume integrated services.  For virtual visits with the [facility], you must be physically in the state of [place] at the time of the visit. For example, if you live in [place], you will have to do an in-person visit with the [facility], and your out-of-state insurance may not cover behavioral health services in [place]. If you are going out of the state or country for any reason, the [facility] may see you virtually when you return to [place], but not while you are physically outside of [place].    Here is a link to the Pregnancy Navigators . Please Fill out prior to your New OB appointment.   English Link: [email or web address]  Spanish Link: [email or web address]   [email or web address] is a Microbiologist

## 2021-03-17 NOTE — Progress Notes (Signed)
10:12 Sandra Krueger not connected virtually. I called her to confirm she is ready to begin. I gave her instructions to join virtually. Sandra Walker,RN  New OB Intake  I connected with  Sandra Krueger on 03/17/21 at 10:15 AM EDT by MyChart Video Visit and verified that I am speaking with the correct person using two identifiers. Nurse is located at [facility] and pt is located at work.  I discussed the limitations, risks, security and privacy concerns of performing an evaluation and management service by telephone and the availability of in person appointments. I also discussed with the patient that there may be a patient responsible charge related to this service. The patient expressed understanding and agreed to proceed.  I explained I am completing New OB Intake today. We discussed her EDD of 10/16/21 that is based on [place]. Sandra Krueger had period around 01/13/21 but states not sure of exact date and it was not a normal period- was only light bleeding for 2 days instead of normal period for 5 days. Pt is G3/P2002. I reviewed her allergies, medications, Medical/Surgical/OB history, and appropriate screenings. I informed her of [facility] services. She had an elevated phq9 today and history anxiety. I offered referral to [facility] which she declined for now. She is aware if needed later on , she can ask for referral.  Based on history, this is a/an  pregnancy complicated by AMA, Obesity, Covid 19 during this pregnancy, Cystitis during this pregnancy  .   She c/o nausea and vomiting in pregnancy. She requested refill of zofran she was given by [facility].  I informed her we do not usually given zofran in first trimester and offered RX for phenergan per protocol which she accepted.  She also requested PNV RX. She is currently living in [place], [place] for work, but home is [place].   Patient Active Problem List   Diagnosis Date Noted   Supervision of high risk pregnancy, antepartum 03/17/2021   AMA (advanced maternal age) multigravida  35+ 03/17/2021   History of COVID-19 03/17/2021   Cystitis during pregnancy in first trimester, antepartum 03/17/2021   Asthma    Obesity in pregnancy    Class 3 severe obesity due to excess calories without serious comorbidity with body mass index (BMI) of 40.0 to 44.9 in adult [facility]) 11/16/2017   Intramural and subserous leiomyoma of uterus 11/16/2017    Concerns addressed today  Delivery Plans:  Plans to deliver at [facility].   MyChart/Babyscripts MyChart access verified. I explained pt will have some visits in office and some virtually. Babyscripts instructions given and order placed.   Blood Pressure Cuff  Blood pressure cuff ordered for patient to pick-up from [employer]. Explained after first prenatal appt pt will check weekly and document in 59.  Weight scale: Patient does have weight scale.   Anatomy US Explained first scheduled [place] will be scheduled for around 19 weeks and she will be notified by MyChart. .   Labs Discussed Sandra Krueger genetic screening with patient. Would like both Panorama and Horizon drawn at new OB visit. Routine prenatal labs needed.  Covid Vaccine Patient has had the covid vaccine. She will bring her vaccination record so that her chart can be updated.   Mother/ Baby Dyad Candidate?   No If yes, offer as possibility  Informed patient of [facility] website  and placed link in her AVS.   Social Determinants of Health Food Insecurity: Patient denies food insecurity. WIC Referral: not addressed- will send message to  patient to address.  Transportation: Patient denies transportation needs. Childcare: Discussed no children allowed at ultrasound appointments. Offered childcare services; patient declines childcare services at this time.   Placed OB Box on problem list and updated  First visit review I reviewed new OB appt with pt. I explained she will have a pelvic exam, ob bloodwork with genetic screening, and PAP smear. Explained  pt will be seen by Dr. Ilda Krueger at first visit; encounter routed to appropriate provider. Explained that patient will be seen by pregnancy navigator following visit with provider. [facility] information placed in AVS.   Sandra Bonn,RN 03/17/2021  11:08 AM

## 2021-03-23 ENCOUNTER — Ambulatory Visit (INDEPENDENT_AMBULATORY_CARE_PROVIDER_SITE_OTHER): Payer: Medicaid Other | Admitting: Obstetrics and Gynecology

## 2021-03-23 ENCOUNTER — Encounter: Payer: Self-pay | Admitting: Obstetrics and Gynecology

## 2021-03-23 ENCOUNTER — Other Ambulatory Visit (HOSPITAL_COMMUNITY)
Admission: RE | Admit: 2021-03-23 | Discharge: 2021-03-23 | Disposition: A | Discharge status: Home / Self Care | Payer: Medicaid Other | Source: Ambulatory Visit | Attending: Obstetrics and Gynecology | Admitting: Obstetrics and Gynecology

## 2021-03-23 ENCOUNTER — Other Ambulatory Visit: Payer: Self-pay

## 2021-03-23 VITALS — BP 130/80 | HR 90 | Wt 254.4 lb

## 2021-03-23 DIAGNOSIS — O0990 Supervision of high risk pregnancy, unspecified, unspecified trimester: Secondary | ICD-10-CM

## 2021-03-23 DIAGNOSIS — Z8616 Personal history of COVID-19: Secondary | ICD-10-CM

## 2021-03-23 DIAGNOSIS — O99210 Obesity complicating pregnancy, unspecified trimester: Secondary | ICD-10-CM

## 2021-03-23 DIAGNOSIS — O2311 Infections of bladder in pregnancy, first trimester: Secondary | ICD-10-CM

## 2021-03-23 DIAGNOSIS — O09521 Supervision of elderly multigravida, first trimester: Secondary | ICD-10-CM

## 2021-03-23 DIAGNOSIS — U071 COVID-19: Secondary | ICD-10-CM

## 2021-03-23 DIAGNOSIS — R7989 Other specified abnormal findings of blood chemistry: Secondary | ICD-10-CM

## 2021-03-23 DIAGNOSIS — Z86018 Personal history of other benign neoplasm: Secondary | ICD-10-CM

## 2021-03-23 DIAGNOSIS — O99511 Diseases of the respiratory system complicating pregnancy, first trimester: Secondary | ICD-10-CM

## 2021-03-23 DIAGNOSIS — R12 Heartburn: Secondary | ICD-10-CM

## 2021-03-23 DIAGNOSIS — O98511 Other viral diseases complicating pregnancy, first trimester: Secondary | ICD-10-CM

## 2021-03-23 DIAGNOSIS — O26891 Other specified pregnancy related conditions, first trimester: Secondary | ICD-10-CM

## 2021-03-23 DIAGNOSIS — Z6841 Body Mass Index (BMI) 40.0 and over, adult: Secondary | ICD-10-CM

## 2021-03-23 DIAGNOSIS — J45909 Unspecified asthma, uncomplicated: Secondary | ICD-10-CM

## 2021-03-23 MED ORDER — FAMOTIDINE 20 MG PO TABS: 20.0000 mg | ORAL_TABLET | Freq: Two times a day (BID) | ORAL | 3 refills | Status: DC

## 2021-03-23 NOTE — Progress Notes (Signed)
New OB Note  03/23/2021   Clinic: [facility]  Chief Complaint: New OB  History of Present Illness: [name] is a 35 y.o. [id number] @ 9/6 weeks ([place] 3/5, based on Patient's last menstrual period was 01/13/2021 (within days).=6wk u/s.   Preg complicated by has BMI [id number], adult ([place]); History of uterine fibroid; Supervision of high risk pregnancy, antepartum; AMA (advanced maternal age) multigravida 48+; History of COVID-19; Cystitis during pregnancy in first trimester, antepartum; Asthma affecting pregnancy in first trimester; Obesity in pregnancy; and COVID-19 affecting pregnancy in first trimester on their problem list.   Any events prior to today's visit: UTI diagnosed at [place] in early July and dx with outpatient covid in late July Her periods were: qmonth, regular She was using no method when she conceived.  She has Negative signs or symptoms of nausea/vomiting of pregnancy but +GERD and ptyalism  She has Negative signs or symptoms of miscarriage or preterm labor  ROS: A 12-point review of systems was performed and negative, except as stated in the above HPI.  OBGYN History: As per HPI. OB History  Gravida Para Term Preterm AB Living  '3 2 2     2  '$ SAB IAB Ectopic Multiple Live Births        0 2    # Outcome Date GA Lbr Len/2nd Weight Sex Delivery Anes PTL Lv  3 Current           2 Term 06/29/18 [redacted]w[redacted]d/ 00:03 7 lb 11 oz (3.487 kg) F Vag-Spont None  LIV     Birth Comments: had BMZ at 36 wk due to subchorionic hemorrhage vs placental lake  1 Term 02/10/09 459w2d5 lb 6 oz (2.438 kg) F Vag-Spont None  LIV     Birth Comments: wnl- fast labor - 9cm when admitted to hospital    Any issues with any prior pregnancies: no Prior children are healthy, doing well, and without any problems or issues: yes History of pap smears: Yes. Last pap smear 2019 and results were neg and HPV neg   Past Medical History: Past Medical History:  Diagnosis Date    Asthma    Fibroids    Headache    Panic attack    last episode approx two months ago per pt    Past Surgical History: Past Surgical History:  Procedure Laterality Date   OTHER SURGICAL HISTORY     fibroids removed   OVARY SURGERY     cyst removed from ? right ovary    Family History:  Family History  Problem Relation Age of Onset   Hypertension Mother    Hypertension Father    Social History:  Social History   Socioeconomic History   Marital status: Single    Spouse name: Not on file   Number of children: 1   Years of education: Not on file   Highest education level: Not on file  Occupational History   Not on file  Tobacco Use   Smoking status: Never   Smokeless tobacco: Never  Vaping Use   Vaping Use: Never used  Substance and Sexual Activity   Alcohol use: Not Currently   Drug use: Not Currently    Types: Marijuana    Comment: as a teenager   Sexual activity: Yes    Birth control/protection: None  Other Topics Concern   Not on file  Social History Narrative   Not on file   Social Determinants of Health  Financial Resource Strain: Not on file  Food Insecurity: No Food Insecurity   Worried About Charity fundraiser in the Last Year: Never true   Arboriculturist in the Last Year: Never true  Transportation Needs: No Transportation Needs   Lack of Transportation (Medical): No   Lack of Transportation (Non-Medical): No  Physical Activity: Not on file  Stress: Not on file  Social Connections: Not on file  Intimate Partner Violence: Not on file    Allergy: Allergies  Allergen Reactions   Bee Venom Anaphylaxis   Other Anaphylaxis    All nuts    Current Outpatient Medications: Prenatal vitamin  Physical Exam:   BP 130/80   Pulse 90   Wt 254 lb 6.4 oz (115.4 kg)   LMP 01/13/2021 (Within Days)   BMI 43.67 kg/m  Body mass index is 43.67 kg/m. Contractions: Not present Vag. Bleeding: None. Fundal height: not applicable FHTs:  [id number]  General appearance: Well nourished, well developed female in no acute distress.  Neck:  Supple, normal appearance, and no thyromegaly  Cardiovascular: S1, S2 normal, no murmur, rub or gallop, regular rate and rhythm Respiratory:  Clear to auscultation bilateral. Normal respiratory effort Abdomen: positive bowel sounds and no masses, hernias; diffusely non tender to palpation, non distended Breasts: patient denies any breast s/s. Neuro/Psych:  Normal mood and affect.  Skin:  Warm and dry.  Lymphatic:  No inguinal lymphadenopathy.   Pelvic exam: is limited by body habitus EGBUS: within normal limits, Vagina: within normal limits and with no blood in the vault, Cervix: normal appearing cervix without discharge or lesions, closed/long/high, Uterus:  nonenlarged, and Adnexa:  normal adnexa and no mass, fullness, tenderness  Laboratory: none  Imaging:  Bedside transabdominal u/s with SLIUP, FHR 170s, +FM, subjectively normal fluid. 4cm fundal, IM fibroid noted.   OB ULTRASOUND FIRST TRIMESTER -- TRANSVAGINAL   HISTORY: Lower pelvic pain. Vomiting   TECHNIQUE: Real-time transvaginal ultrasonography.   COMPARISON: None   FINDINGS:   UTERUS: Size: 11.8 x 8.3 x 8.2 cm.    Single intrauterine gestation with normal yolk sac and fetal pole measuring 3.4 mm. Fetal heart tones measure 127 bpm.   Lenticular 3.6 x 3.5 x 2.5 cm probable subchorionic hemorrhage. LEFT subserosal 5.1 cm hypoechoic rounded focus favoring a uterine fibroid.   OVARIES AND ADNEXA: The RIGHT ovary measures 4.2 x 2.1 x 3.1 cm and the LEFT ovary measures 3.6 x 1.6 x 1.4 cm. Both ovaries demonstrate normal echotexture with no suspicious adnexal mass. Normal Doppler waveforms.   No significant free fluid in the pelvis. Procedure Note  Tamela Gammon, MD - 02/20/2021  Formatting of this note might be different from the original.  OB ULTRASOUND FIRST TRIMESTER -- TRANSVAGINAL   HISTORY: Lower pelvic pain.  Vomiting   TECHNIQUE: Real-time transvaginal ultrasonography.   COMPARISON: None   FINDINGS:   UTERUS: Size: 11.8 x 8.3 x 8.2 cm.    Single intrauterine gestation with normal yolk sac and fetal pole measuring 3.4 mm. Fetal heart tones measure 127 bpm.   Lenticular 3.6 x 3.5 x 2.5 cm probable subchorionic hemorrhage. LEFT subserosal 5.1 cm hypoechoic rounded focus favoring a uterine fibroid.   OVARIES AND ADNEXA: The RIGHT ovary measures 4.2 x 2.1 x 3.1 cm and the LEFT ovary measures 3.6 x 1.6 x 1.4 cm. Both ovaries demonstrate normal echotexture with no suspicious adnexal mass. Normal Doppler waveforms.   No significant free fluid in the pelvis.    IMPRESSION:  Single live intrauterine gestation with estimated gestational age [redacted] weeks 0 days.   Fetal heart tones measure 127 bpm.   Subserosal uterine fibroid.   Small subchorionic hemorrhage.   This examination does not rule out the possibility of fetal abnormalities. Exam End: 02/20/21 19:35    Assessment: pt doing well  Plan: 1. Supervision of high risk pregnancy, antepartum Offer AFP >15wks. Schedule anatomy u/s nv - CBC/D/Plt+RPR+Rh+ABO+RubIgG... - Genetic Screening - HIV Antibody (routine testing w rflx) - Hemoglobin A1c - Culture, OB Urine - Comprehensive metabolic panel - Cervicovaginal ancillary only( [place]) - Protein / creatinine ratio, urine - TSH  2. Heartburn during pregnancy in first trimester Pepcid bid sent in  3. Asthma affecting pregnancy in first trimester Patient has advair at home but not using; she states she rarely needs albuterol and wants to hold off on re-starting maintenance advair. I told her if she is using the albuterol more then 3-4x/week she needs to go back on advair 1-2 puffs bid  4. Obesity in pregnancy Weight goals d/w pt  5. History of COVID-19 No sequelae. Dx in late July at urgent care (see care everywhere)  6. History of uterine fibroid Continue to follow during  pregnancy  7. BMI 40.0-44.9, adult ([place])  8. Multigravida of advanced maternal age in first trimester F/u genetics. See above.   Problem list reviewed and updated.  Follow up in 4 weeks.  The nature of [place] with multiple MDs and other Advanced Practice Providers was explained to patient; also emphasized that residents, students are part of our team.  >50% of 40 min visit spent on counseling and coordination of care.     Durene Romans MD Attending Center for [facility])

## 2021-03-24 DIAGNOSIS — U071 COVID-19: Secondary | ICD-10-CM

## 2021-03-24 HISTORY — DX: COVID-19: U07.1

## 2021-03-24 LAB — COMPREHENSIVE METABOLIC PANEL
ALT: 11 IU/L (ref 0–32)
AST: 13 IU/L (ref 0–40)
Albumin/Globulin Ratio: 1.3 (ref 1.2–2.2)
Albumin: 3.9 g/dL (ref 3.8–4.8)
Alkaline Phosphatase: 49 IU/L (ref 44–121)
BUN/Creatinine Ratio: 13 (ref 9–23)
BUN: 7 mg/dL (ref 6–20)
Bilirubin Total: 0.2 mg/dL (ref 0.0–1.2)
CO2: 20 mmol/L (ref 20–29)
Calcium: 9 mg/dL (ref 8.7–10.2)
Chloride: 103 mmol/L (ref 96–106)
Creatinine, Ser: 0.54 mg/dL — ABNORMAL LOW (ref 0.57–1.00)
Globulin, Total: 3.1 g/dL (ref 1.5–4.5)
Glucose: 105 mg/dL — ABNORMAL HIGH (ref 65–99)
Potassium: 3.4 mmol/L — ABNORMAL LOW (ref 3.5–5.2)
Sodium: 136 mmol/L (ref 134–144)
Total Protein: 7 g/dL (ref 6.0–8.5)
eGFR: 123 mL/min/{1.73_m2} (ref >59)

## 2021-03-24 LAB — CBC/D/PLT+RPR+RH+ABO+RUBIGG...
Antibody Screen: NEGATIVE
Basophils Absolute: 0 10*3/uL (ref 0.0–0.2)
Basos: 0 %
EOS (ABSOLUTE): 0 10*3/uL (ref 0.0–0.4)
Eos: 1 %
HCV Ab: <0.1 s/co ratio (ref 0.0–0.9)
HIV Screen 4th Generation wRfx: NONREACTIVE
Hematocrit: 36.5 % (ref 34.0–46.6)
Hemoglobin: 11.5 g/dL (ref 11.1–15.9)
Hepatitis B Surface Ag: NEGATIVE
Immature Grans (Abs): 0 10*3/uL (ref 0.0–0.1)
Immature Granulocytes: 0 %
Lymphocytes Absolute: 1.4 10*3/uL (ref 0.7–3.1)
Lymphs: 28 %
MCH: 25.3 pg — ABNORMAL LOW (ref 26.6–33.0)
MCHC: 31.5 g/dL (ref 31.5–35.7)
MCV: 80 fL (ref 79–97)
Monocytes Absolute: 0.5 10*3/uL (ref 0.1–0.9)
Monocytes: 9 %
Neutrophils Absolute: 3.1 10*3/uL (ref 1.4–7.0)
Neutrophils: 62 %
Platelets: 269 10*3/uL (ref 150–450)
RBC: 4.54 x10E6/uL (ref 3.77–5.28)
RDW: 14.7 % (ref 11.7–15.4)
RPR Ser Ql: NONREACTIVE
Rh Factor: POSITIVE
Rubella Antibodies, IGG: 3.21 index (ref >0.99)
WBC: 5 10*3/uL (ref 3.4–10.8)

## 2021-03-24 LAB — PROTEIN / CREATININE RATIO, URINE
Creatinine, Urine: 109.7 mg/dL
Protein, Ur: 9.8 mg/dL
Protein/Creat Ratio: 89 mg/g creat (ref 0–200)

## 2021-03-24 LAB — CERVICOVAGINAL ANCILLARY ONLY
Chlamydia: NEGATIVE
Comment: NEGATIVE
Comment: NORMAL
Neisseria Gonorrhea: NEGATIVE

## 2021-03-24 LAB — TSH: TSH: 0.441 u[IU]/mL — ABNORMAL LOW (ref 0.450–4.500)

## 2021-03-24 LAB — HEMOGLOBIN A1C
Est. average glucose Bld gHb Est-mCnc: 100 mg/dL
Hgb A1c MFr Bld: 5.1 % (ref 4.8–5.6)

## 2021-03-24 LAB — HCV INTERPRETATION

## 2021-03-25 LAB — CULTURE, OB URINE

## 2021-03-25 LAB — URINE CULTURE, OB REFLEX

## 2021-03-30 DIAGNOSIS — R7989 Other specified abnormal findings of blood chemistry: Secondary | ICD-10-CM | POA: Insufficient documentation

## 2021-03-30 NOTE — Addendum Note (Signed)
Addended by: Valla Leaver A on: 03/30/2021 09:36 AM   Modules accepted: Orders

## 2021-03-30 NOTE — Addendum Note (Signed)
Addended by: Aletha Halim on: 03/30/2021 08:35 AM   Modules accepted: Orders

## 2021-03-31 LAB — SPECIMEN STATUS REPORT

## 2021-03-31 LAB — T4, FREE: Free T4: 1.19 ng/dL (ref 0.82–1.77)

## 2021-04-15 ENCOUNTER — Telehealth: Payer: Self-pay | Admitting: Lactation Services

## 2021-04-15 ENCOUNTER — Other Ambulatory Visit: Payer: Self-pay

## 2021-04-15 ENCOUNTER — Ambulatory Visit (INDEPENDENT_AMBULATORY_CARE_PROVIDER_SITE_OTHER): Payer: Medicaid Other | Admitting: Obstetrics and Gynecology

## 2021-04-15 ENCOUNTER — Encounter: Payer: Self-pay | Admitting: Family Medicine

## 2021-04-15 VITALS — BP 115/74 | HR 86 | Wt 255.0 lb

## 2021-04-15 DIAGNOSIS — O98511 Other viral diseases complicating pregnancy, first trimester: Secondary | ICD-10-CM

## 2021-04-15 DIAGNOSIS — Z3A13 13 weeks gestation of pregnancy: Secondary | ICD-10-CM

## 2021-04-15 DIAGNOSIS — R7989 Other specified abnormal findings of blood chemistry: Secondary | ICD-10-CM

## 2021-04-15 DIAGNOSIS — O09521 Supervision of elderly multigravida, first trimester: Secondary | ICD-10-CM

## 2021-04-15 DIAGNOSIS — O99511 Diseases of the respiratory system complicating pregnancy, first trimester: Secondary | ICD-10-CM

## 2021-04-15 DIAGNOSIS — D563 Thalassemia minor: Secondary | ICD-10-CM

## 2021-04-15 DIAGNOSIS — J45909 Unspecified asthma, uncomplicated: Secondary | ICD-10-CM

## 2021-04-15 DIAGNOSIS — O99210 Obesity complicating pregnancy, unspecified trimester: Secondary | ICD-10-CM

## 2021-04-15 DIAGNOSIS — Z6841 Body Mass Index (BMI) 40.0 and over, adult: Secondary | ICD-10-CM

## 2021-04-15 DIAGNOSIS — Z86018 Personal history of other benign neoplasm: Secondary | ICD-10-CM

## 2021-04-15 DIAGNOSIS — U071 COVID-19: Secondary | ICD-10-CM

## 2021-04-15 DIAGNOSIS — O0990 Supervision of high risk pregnancy, unspecified, unspecified trimester: Secondary | ICD-10-CM

## 2021-04-15 MED ORDER — ASPIRIN EC 81 MG PO TBEC: 81.0000 mg | DELAYED_RELEASE_TABLET | Freq: Every day | ORAL | 2 refills | Status: DC

## 2021-04-15 NOTE — Telephone Encounter (Signed)
Called and spoke with patient to give her results of Horizon Carrier Screening.   Reviewed with patient that she is a silent carrier for Alpha Thalassemia.   Reviewed calling [facility] at [phone] to schedule a telephone genetic counseling session.   Reviewed it is recommended that partner be tested to see if he carries the same gene. Patient informed partner screening kits are available in the office if partner would like to come to appointment or mom can take home for FOB to send sample to [place].   Patient voiced understanding.

## 2021-04-15 NOTE — Progress Notes (Signed)
   PRENATAL VISIT NOTE  Subjective:  Sandra Krueger is a 35 y.o. G3P2002 at 48w1dbeing seen today for ongoing prenatal care.  She is currently monitored for the following issues for this high-risk pregnancy and has BMI 40.0-44.9, adult ([place]; History of uterine fibroid; Supervision of high risk pregnancy, antepartum; AMA (advanced maternal age) multigravida 33+ History of COVID-19; Asthma affecting pregnancy in first trimester; Obesity in pregnancy; COVID-19 affecting pregnancy in first trimester; and Low TSH level on their problem list.  Patient reports no complaints.  Contractions: Not present. Vag. Bleeding: None.  Movement: Present. Denies leaking of fluid.   The following portions of the patient's history were reviewed and updated as appropriate: allergies, current medications, past family history, past medical history, past social history, past surgical history and problem list.   Objective:   Vitals:   04/15/21 1042  BP: 115/74  Pulse: 86  Weight: 255 lb (115.7 kg)    Fetal Status: Fetal Heart Rate (bpm): 147   Movement: Present     General:  Alert, oriented and cooperative. Patient is in no acute distress.  Skin: Skin is warm and dry. No rash noted.   Cardiovascular: Normal heart rate noted  Respiratory: Normal respiratory effort, no problems with respiration noted  Abdomen: Soft, gravid, appropriate for gestational age.  Pain/Pressure: Present     Pelvic: Cervical exam deferred        Extremities: Normal range of motion.  Edema: None  Mental Status: Normal mood and affect. Normal behavior. Normal judgment and thought content.   Assessment and Plan:  Pregnancy: G3P2002 at [redacted]w[redacted]d. Supervision of high risk pregnancy, antepartum Offer afp nv Low risk panorama  I d/w pt re: alpha thal silent carrier. Pt declines any further testing Anatomy u/s scheduled on 10/10 Pt amenable to low dose asa  2. [redacted] weeks gestation of pregnancy  3. Low TSH level Normal ft4. Rpt with  28wks  4. Multigravida of advanced maternal age in first trimester   5. Obesity in pregnancy  6. BMI 40.0-44.9, adult ([place] 7. History of uterine fibroid F/u at anatomy u/s  8. Asthma affecting pregnancy in first trimester Pt states she is not needing and is not using any maintenance or albuterol inhaler  9. COVID-19 affecting pregnancy in first trimester No sequelae.   Preterm labor symptoms and general obstetric precautions including but not limited to vaginal bleeding, contractions, leaking of fluid and fetal movement were reviewed in detail with the patient. Please refer to After Visit Summary for other counseling recommendations.   Return in about 5 weeks (around 05/23/2021) for in person, md or app, low risk ob.  Future Appointments  Date Time Provider De[place]10/05/2021  9:30 AM [facility]10/05/2021  9:45 AM W[facility]-M[facility] US5 [facility]US [facility]10/07/2021 10:15 AM ErChancy MilroyMD WMSouthern [facility] Center[facility]  ChAletha HalimMD

## 2021-04-18 DIAGNOSIS — D563 Thalassemia minor: Secondary | ICD-10-CM | POA: Insufficient documentation

## 2021-04-27 ENCOUNTER — Telehealth: Payer: Self-pay | Admitting: Lactation Services

## 2021-04-27 NOTE — Telephone Encounter (Signed)
Received information from after hours nurse that patient was experiencing contractions and abdominal pain on 9/10. She was advised and went to ED in [facility].   She reports she was told she has a UTI and was dehydrated. She is taking ATB for UTI and has doubled her water. She reports she also has to spit a lot and that may effect her fluid intake.   She reports she is feeling better and has no concerns at this time.

## 2021-05-23 ENCOUNTER — Encounter: Payer: Self-pay | Admitting: *Deleted

## 2021-05-23 ENCOUNTER — Encounter: Payer: Self-pay | Admitting: Obstetrics and Gynecology

## 2021-05-23 ENCOUNTER — Other Ambulatory Visit: Payer: Self-pay

## 2021-05-23 ENCOUNTER — Ambulatory Visit: Payer: Medicaid Other | Attending: Obstetrics and Gynecology

## 2021-05-23 ENCOUNTER — Other Ambulatory Visit: Payer: Self-pay | Admitting: *Deleted

## 2021-05-23 ENCOUNTER — Ambulatory Visit: Payer: Medicaid Other | Admitting: *Deleted

## 2021-05-23 VITALS — BP 111/64 | HR 96

## 2021-05-23 DIAGNOSIS — O3660X0 Maternal care for excessive fetal growth, unspecified trimester, not applicable or unspecified: Secondary | ICD-10-CM | POA: Insufficient documentation

## 2021-05-23 DIAGNOSIS — O09522 Supervision of elderly multigravida, second trimester: Secondary | ICD-10-CM

## 2021-05-23 DIAGNOSIS — J45909 Unspecified asthma, uncomplicated: Secondary | ICD-10-CM

## 2021-05-23 DIAGNOSIS — O2311 Infections of bladder in pregnancy, first trimester: Secondary | ICD-10-CM | POA: Insufficient documentation

## 2021-05-23 DIAGNOSIS — Z8616 Personal history of COVID-19: Secondary | ICD-10-CM | POA: Diagnosis present

## 2021-05-23 DIAGNOSIS — J4520 Mild intermittent asthma, uncomplicated: Secondary | ICD-10-CM | POA: Diagnosis present

## 2021-05-23 DIAGNOSIS — O99210 Obesity complicating pregnancy, unspecified trimester: Secondary | ICD-10-CM

## 2021-05-23 DIAGNOSIS — O0990 Supervision of high risk pregnancy, unspecified, unspecified trimester: Secondary | ICD-10-CM

## 2021-05-23 DIAGNOSIS — O09529 Supervision of elderly multigravida, unspecified trimester: Secondary | ICD-10-CM | POA: Diagnosis present

## 2021-05-23 DIAGNOSIS — O99511 Diseases of the respiratory system complicating pregnancy, first trimester: Secondary | ICD-10-CM | POA: Diagnosis present

## 2021-05-23 DIAGNOSIS — Z362 Encounter for other antenatal screening follow-up: Secondary | ICD-10-CM

## 2021-05-23 IMAGING — US US MFM OB DETAIL+14 WK
1 series · 10 of 28 positions shown · non-contrast
Comparison: none

[Series 1: us mfm ob detail+14 wk · 10 of 74 slices shown]
[im 3/74]
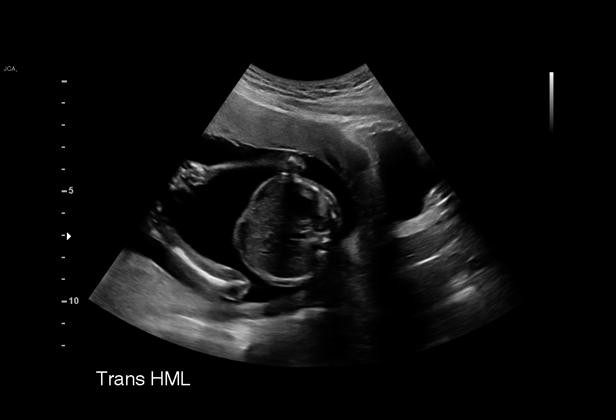
[im 11/74]
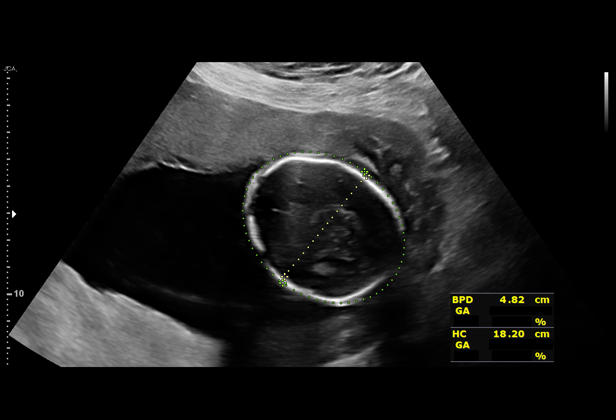
[im 19/74]
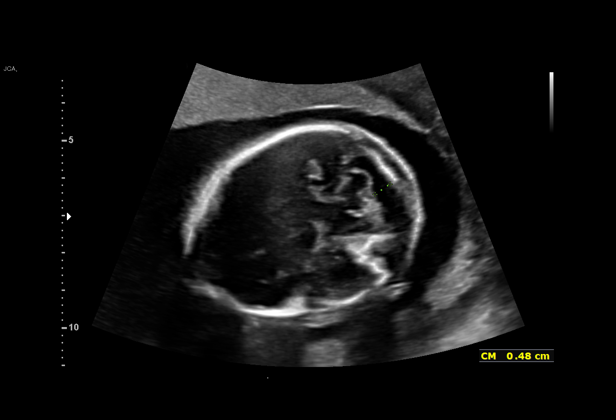
[im 25/74]
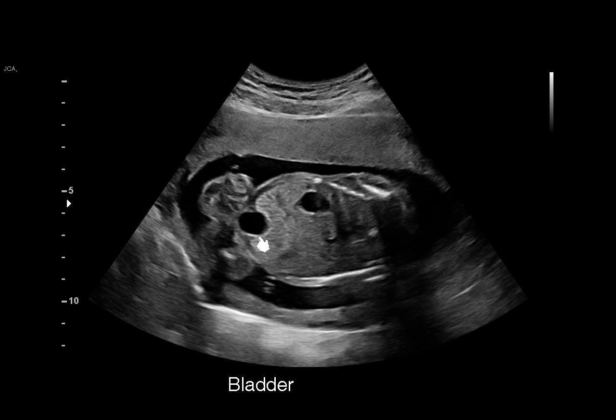
[im 33/74]
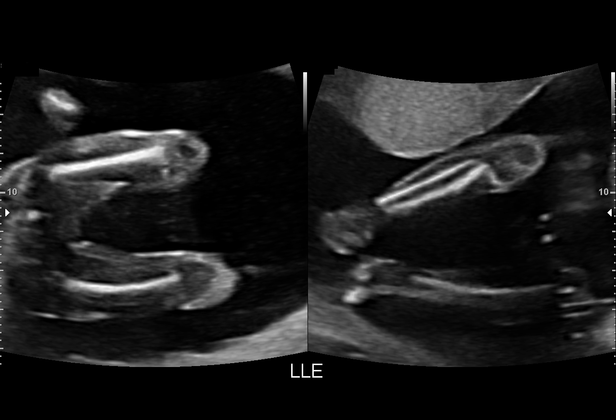
[im 41/74]
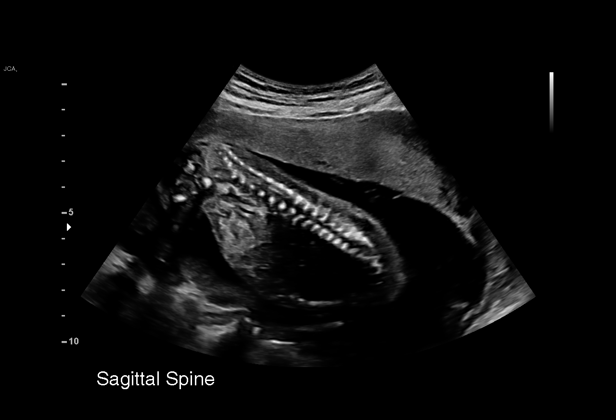
[im 49/74]
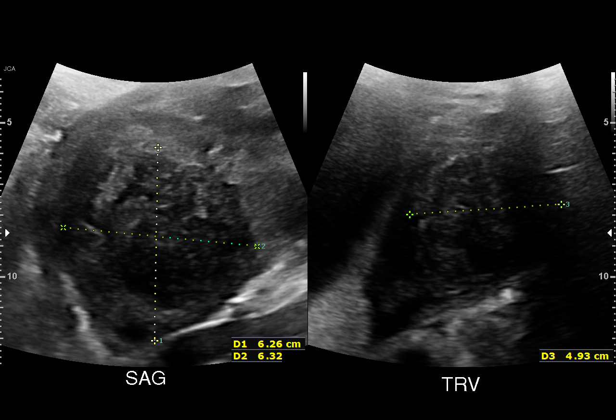
[im 57/74]
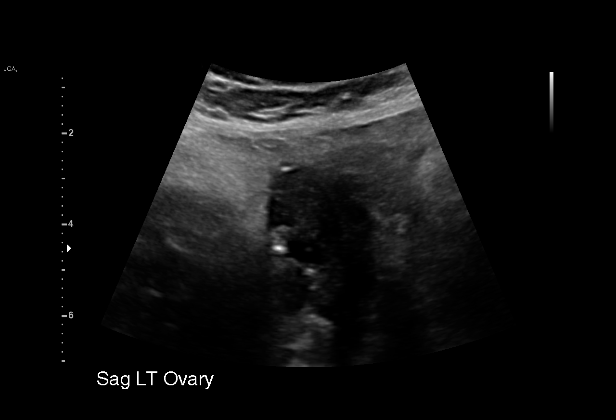
[im 63/74]
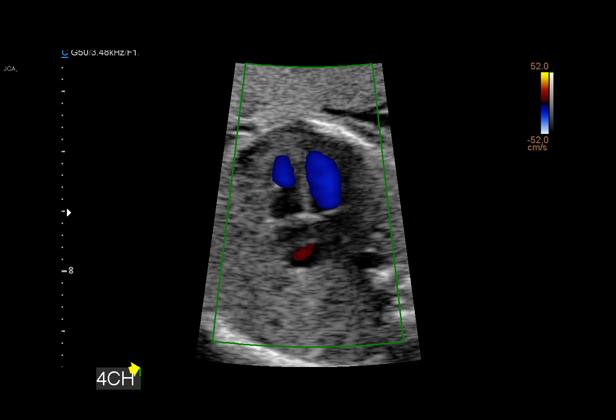
[im 71/74]
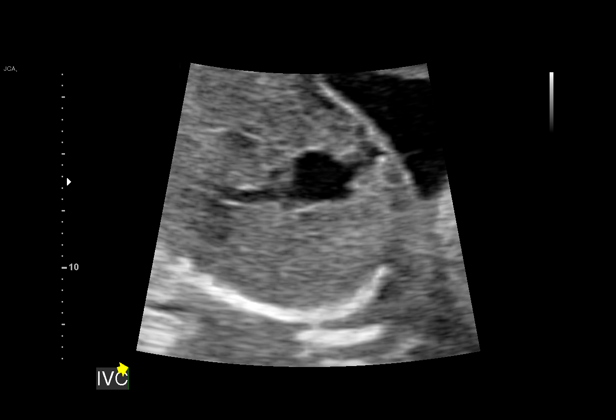

[10 of 28 positions shown; findings below may reference images not displayed]

Addendum:\.br----------------------------------------------------------------------
----------------------------------------------------------------------

----------------------------------------------------------------------

----------------------------------------------------------------------

----------------------------------------------------------------------

----------------------------------------------------------------------
Indications

 Advanced maternal age multigravida 35+,
 second trimester
 Obesity complicating pregnancy, second
 trimester (BMI 45)
 Uterine fibroids affecting pregnancy in        O34.12,
 second trimester, antepartum
 18 weeks gestation of pregnancy
 Encounter for antenatal screening for
 malformations
 Genetic carrier (Zaharaa Warew)
----------------------------------------------------------------------
Fetal Evaluation

 Num Of Fetuses:         1
 Fetal Heart Rate(bpm):  137
 Cardiac Activity:       Observed
 Presentation:           Transverse, head to maternal left

 Amniotic Fluid
 AFI FV:      Within normal limits

                             Largest Pocket(cm)

----------------------------------------------------------------------
Biometry

 BPD:      47.5  mm     G. Age:  20w 3d         98  %    CI:        69.42   %    70 - 86
                                                         FL/HC:      17.6   %    16.1 -
 HC:       182   mm     G. Age:  20w 4d       > 99  %    HC/AC:      1.11        1.09 -
 AC:      163.4  mm     G. Age:  21w 3d       > 99  %    FL/BPD:     67.4   %
 FL:         32  mm     G. Age:  20w 0d         88  %    FL/AC:      19.6   %    20 - 24
 HUM:      31.2  mm     G. Age:  20w 3d       > 95  %
 CER:      23.2  mm     G. Age:  21w 4d     > 97.7  %
 NFT:       3.7  mm

 LV:        5.5  mm
 CM:        4.8  mm

 Est. FW:     373  gm    0 lb 13 oz    > 99  %
----------------------------------------------------------------------
OB History

 Gravidity:    3         Term:   2        Prem:   0        SAB:   0
 TOP:          0       Ectopic:  0        Living: 2
----------------------------------------------------------------------
Gestational Age

 LMP:           18w 4d        Date:  01/13/21                 EDD:   10/20/21
 U/S Today:     20w 4d                                        EDD:   10/06/21
 Best:          18w 4d     Det. By:  LMP  (01/13/21)          EDD:   10/20/21
----------------------------------------------------------------------
Anatomy

 Cranium:               Appears normal         Aortic Arch:            Not well visualized
 Cavum:                 Appears normal         Ductal Arch:            Appears normal
 Ventricles:            Appears normal         Diaphragm:              Appears normal
 Choroid Plexus:        Appears normal         Stomach:                Appears normal, left
                                                                       sided
 Cerebellum:            Appears normal         Abdomen:                Appears normal
 Posterior Fossa:       Appears normal         Abdominal Wall:         Appears nml (cord
                                                                       insert, abd wall)
 Nuchal Fold:           Appears normal         Cord Vessels:           Appears normal (3
                                                                       vessel cord)
 Face:                  Appears normal         Kidneys:                Appear normal
                        (orbits and profile)
 Lips:                  Appears normal         Bladder:                Appears normal
 Thoracic:              Appears normal         Spine:                  Limited views
                                                                       appear normal
 Heart:                 Appears normal         Upper Extremities:      LUE Seen ; RUE
                        (4CH, axis, and                                not well seen
                        situs)
 RVOT:                  Appears normal         Lower Extremities:      Appears normal
 LVOT:                  Appears normal
----------------------------------------------------------------------
Targeted Anatomy

 Thorax
 3 V Trachea View:      Appears normal         IVC:                    Appears normal
----------------------------------------------------------------------
Cervix Uterus Adnexa

 Cervix
 Length:           4.67  cm.
 Normal appearance by transabdominal scan.

 Uterus
 No abnormality visualized.
 Right Ovary
 Within normal limits.

 Left Ovary
 Within normal limits.
----------------------------------------------------------------------
Myomas

 Site                     L(cm)      W(cm)      D(cm)       Location
 Left posterior           6.26       6.32       4.93        Intramural
----------------------------------------------------------------------

 Blood Flow                  RI       PI       Comments

----------------------------------------------------------------------
Impression

 G3 P2. Patient is here for fetal anatomy scan .

 On cell-free fetal DNA screening, the risks of fetal
 aneuploidies are not increased .  Patient does not have
 chronic medical conditions including diabetes.

 Obstetric history significant for 2 term vaginal deliveries.  Her
 first child weighed 5 pounds 6 ounces at birth and the second
 7 pounds 11 ounce at birth.

 Her pregnancy is well dated by LMP date that is consistent
 with 6-week ultrasound performed at your office ([REDACTED] note).

 We performed fetal anatomy scan. No makers of
 aneuploidies or fetal structural defects are seen. Fetal
 biometry measurements are 2 weeks ahead of established
 gestational age by LMP date. Amniotic fluid is normal and
 good fetal activity is seen. Patient understands the limitations
 of ultrasound in detecting fetal anomalies.
 A large intramural myoma is seen and the patient does not
 have symptoms pertaining to the myoma.
 As maternal obesity limits resolution of images, failure to
 detect anomalies are more common .

 I have not amended her EDD based on the MDs note on
 ultrasound performed at 6 weeks (no measurements are
 available). If ultrasound performed at 6 weeks' gestation was
 only to check fetal heart activity and not dating, I recommend
 we change her EDD to 10/06/2021 based on today's
 ultrasound measurements.
----------------------------------------------------------------------
Recommendations

 -An appointment was made for her to return in 4 weeks for
 completion of fetal anatomy (aortic arch, SVC, 3VV).
 -Fetal growth assessments every 4 weeks.
----------------------------------------------------------------------
                      Ramiirez, Fressy
----------------------------------------------------------------------

*** End of Addendum ***\.br----------------------------------------------------------------------
----------------------------------------------------------------------

----------------------------------------------------------------------

----------------------------------------------------------------------

----------------------------------------------------------------------

----------------------------------------------------------------------
Indications

 Advanced maternal age multigravida 35+,
 second trimester
 Obesity complicating pregnancy, second
 trimester (BMI 45)
 Uterine fibroids affecting pregnancy in        O34.12,
 second trimester, antepartum
 18 weeks gestation of pregnancy
 Encounter for antenatal screening for
 malformations
 Genetic carrier (Zaharaa Warew)
----------------------------------------------------------------------
Fetal Evaluation

 Num Of Fetuses:         1
 Fetal Heart Rate(bpm):  137
 Cardiac Activity:       Observed
 Presentation:           Transverse, head to maternal left

 Amniotic Fluid
 AFI FV:      Within normal limits

                             Largest Pocket(cm)

----------------------------------------------------------------------
Biometry

 BPD:      47.5  mm     G. Age:  20w 3d         98  %    CI:        69.42   %    70 - 86
                                                         FL/HC:      17.6   %    16.1 -
 HC:       182   mm     G. Age:  20w 4d       > 99  %    HC/AC:      1.11        1.09 -
 AC:      163.4  mm     G. Age:  21w 3d       > 99  %    FL/BPD:     67.4   %
 FL:         32  mm     G. Age:  20w 0d         88  %    FL/AC:      19.6   %    20 - 24
 HUM:      31.2  mm     G. Age:  20w 3d       > 95  %
 CER:      23.2  mm     G. Age:  21w 4d     > 97.7  %
 NFT:       3.7  mm

 LV:        5.5  mm
 CM:        4.8  mm

 Est. FW:     373  gm    0 lb 13 oz    > 99  %
----------------------------------------------------------------------
OB History

 Gravidity:    3         Term:   2        Prem:   0        SAB:   0
 TOP:          0       Ectopic:  0        Living: 2
----------------------------------------------------------------------
Gestational Age

 LMP:           18w 4d        Date:  01/13/21                 EDD:   10/20/21
 U/S Today:     20w 4d                                        EDD:   10/06/21
 Best:          18w 4d     Det. By:  LMP  (01/13/21)          EDD:   10/20/21
----------------------------------------------------------------------
Anatomy

 Cranium:               Appears normal         Aortic Arch:            Not well visualized
 Cavum:                 Appears normal         Ductal Arch:            Appears normal
 Ventricles:            Appears normal         Diaphragm:              Appears normal
 Choroid Plexus:        Appears normal         Stomach:                Appears normal, left
                                                                       sided
 Cerebellum:            Appears normal         Abdomen:                Appears normal
 Posterior Fossa:       Appears normal         Abdominal Wall:         Appears nml (cord
                                                                       insert, abd wall)
 Nuchal Fold:           Appears normal         Cord Vessels:           Appears normal (3
                                                                       vessel cord)
 Face:                  Appears normal         Kidneys:                Appear normal
                        (orbits and profile)
 Lips:                  Appears normal         Bladder:                Appears normal
 Thoracic:              Appears normal         Spine:                  Limited views
                                                                       appear normal
 Heart:                 Appears normal         Upper Extremities:      LUE Seen ; RUE
                        (4CH, axis, and                                not well seen
                        situs)
 RVOT:                  Appears normal         Lower Extremities:      Appears normal
 LVOT:                  Appears normal
----------------------------------------------------------------------
Targeted Anatomy

 Thorax
 3 V Trachea View:      Appears normal         IVC:                    Appears normal
----------------------------------------------------------------------
Cervix Uterus Adnexa

 Cervix
 Length:           4.67  cm.
 Normal appearance by transabdominal scan.

 Uterus
 No abnormality visualized.
 Right Ovary
 Within normal limits.

 Left Ovary
 Within normal limits.
----------------------------------------------------------------------
Myomas

 Site                     L(cm)      W(cm)      D(cm)       Location
 Left posterior           6.26       6.32       4.93        Intramural
----------------------------------------------------------------------

 Blood Flow                  RI       PI       Comments

----------------------------------------------------------------------
Impression

 G3 P2. Patient is here for fetal anatomy scan .

 On cell-free fetal DNA screening, the risks of fetal
 aneuploidies are not increased .  Patient does not have
 chronic medical conditions including diabetes.

 Obstetric history significant for 2 term vaginal deliveries.  Her
 first child weighed 5 pounds 6 ounces at birth and the second
 7 pounds 11 ounce at birth.

 Her pregnancy is well dated by LMP date that is consistent
 with 6-week ultrasound performed at your office ([REDACTED] note).

 We performed fetal anatomy scan. No makers of
 aneuploidies or fetal structural defects are seen. Fetal
 biometry measurements are 2 weeks ahead of established
 gestational age by LMP date. Amniotic fluid is normal and
 good fetal activity is seen. Patient understands the limitations
 of ultrasound in detecting fetal anomalies.
 A large intramural myoma is seen and the patient does not
 have symptoms pertaining to the myoma.
 As maternal obesity limits resolution of images, failure to
 detect anomalies are more common .

 I have not amended her EDD based on the MDs note on
 ultrasound performed at 6 weeks (no measurements are
 available). If ultrasound performed at 6 weeks' gestation was
 only to check fetal heart activity and not dating, I recommend
 we change her EDD to 10/06/2021 based on today's
 ultrasound measurements.
----------------------------------------------------------------------
Recommendations

 -An appointment was made for her to return in 4 weeks for
 completion of fetal anatomy (aortic arch, SVC, 3VV).
 -Fetal growth assessments every 4 weeks.
----------------------------------------------------------------------
                 Ramiirez, Fressy
----------------------------------------------------------------------

## 2021-05-25 ENCOUNTER — Encounter: Payer: Medicaid Other | Admitting: Obstetrics and Gynecology

## 2021-06-02 ENCOUNTER — Encounter: Payer: Self-pay | Admitting: *Deleted

## 2021-06-20 ENCOUNTER — Ambulatory Visit: Payer: Medicaid Other | Attending: Obstetrics and Gynecology

## 2021-06-20 ENCOUNTER — Other Ambulatory Visit: Payer: Self-pay

## 2021-06-20 ENCOUNTER — Ambulatory Visit: Payer: Medicaid Other | Admitting: *Deleted

## 2021-06-20 ENCOUNTER — Other Ambulatory Visit: Payer: Self-pay | Admitting: *Deleted

## 2021-06-20 VITALS — BP 105/60 | HR 86

## 2021-06-20 DIAGNOSIS — O09522 Supervision of elderly multigravida, second trimester: Secondary | ICD-10-CM | POA: Diagnosis present

## 2021-06-20 DIAGNOSIS — O99511 Diseases of the respiratory system complicating pregnancy, first trimester: Secondary | ICD-10-CM

## 2021-06-20 DIAGNOSIS — O99210 Obesity complicating pregnancy, unspecified trimester: Secondary | ICD-10-CM

## 2021-06-20 DIAGNOSIS — Z362 Encounter for other antenatal screening follow-up: Secondary | ICD-10-CM | POA: Diagnosis present

## 2021-06-20 DIAGNOSIS — J45909 Unspecified asthma, uncomplicated: Secondary | ICD-10-CM | POA: Diagnosis present

## 2021-06-20 DIAGNOSIS — O0990 Supervision of high risk pregnancy, unspecified, unspecified trimester: Secondary | ICD-10-CM | POA: Insufficient documentation

## 2021-06-20 DIAGNOSIS — O3412 Maternal care for benign tumor of corpus uteri, second trimester: Secondary | ICD-10-CM

## 2021-06-20 DIAGNOSIS — O99212 Obesity complicating pregnancy, second trimester: Secondary | ICD-10-CM

## 2021-06-20 DIAGNOSIS — D259 Leiomyoma of uterus, unspecified: Secondary | ICD-10-CM

## 2021-06-20 DIAGNOSIS — Z8616 Personal history of COVID-19: Secondary | ICD-10-CM | POA: Diagnosis present

## 2021-06-20 DIAGNOSIS — Z3A24 24 weeks gestation of pregnancy: Secondary | ICD-10-CM

## 2021-06-20 DIAGNOSIS — Z6841 Body Mass Index (BMI) 40.0 and over, adult: Secondary | ICD-10-CM

## 2021-06-20 DIAGNOSIS — E669 Obesity, unspecified: Secondary | ICD-10-CM

## 2021-06-20 IMAGING — US US MFM OB FOLLOW-UP
1 series · 13 of 28 positions shown · non-contrast
Comparison: none

[Series 1: us mfm ob follow-up · 13 of 91 slices shown]
[im 4/91]
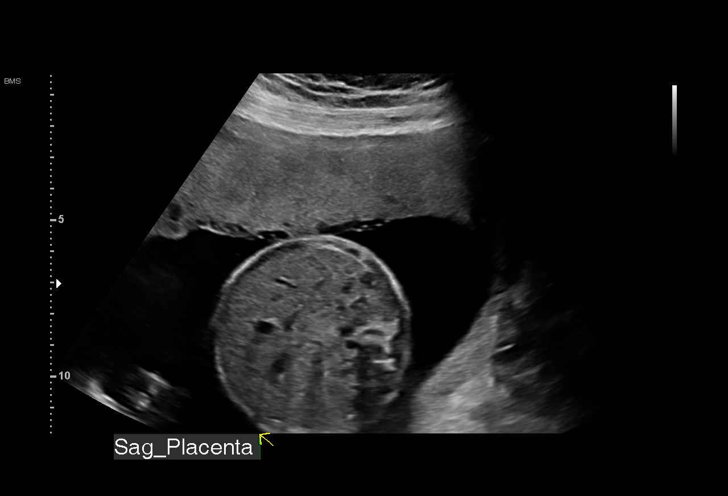
[im 11/91]
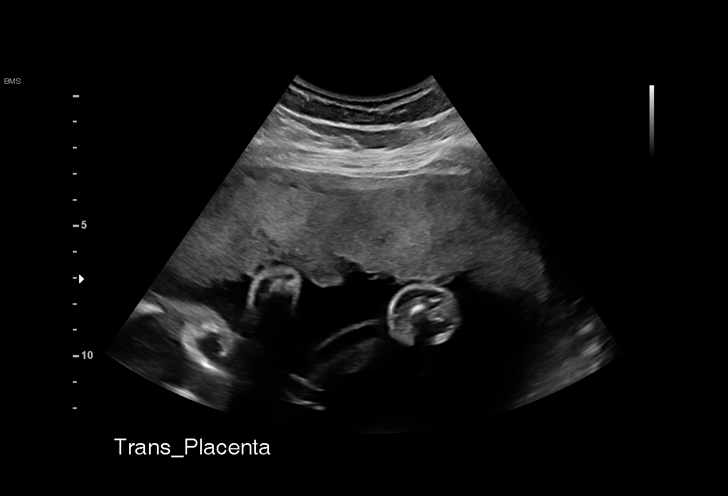
[im 17/91]
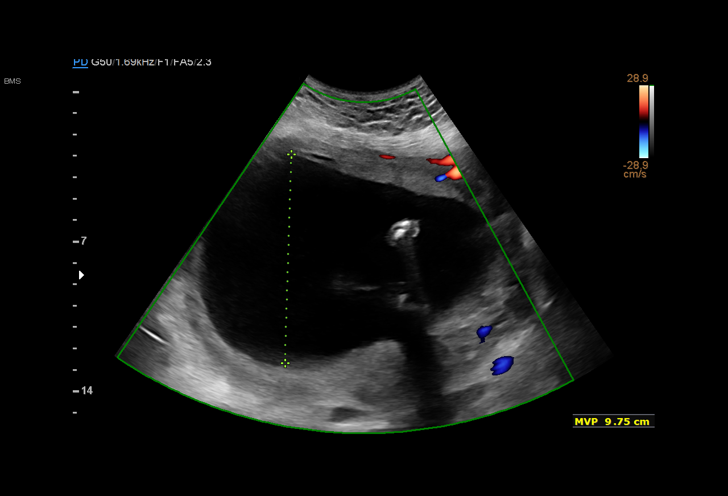
[im 24/91]
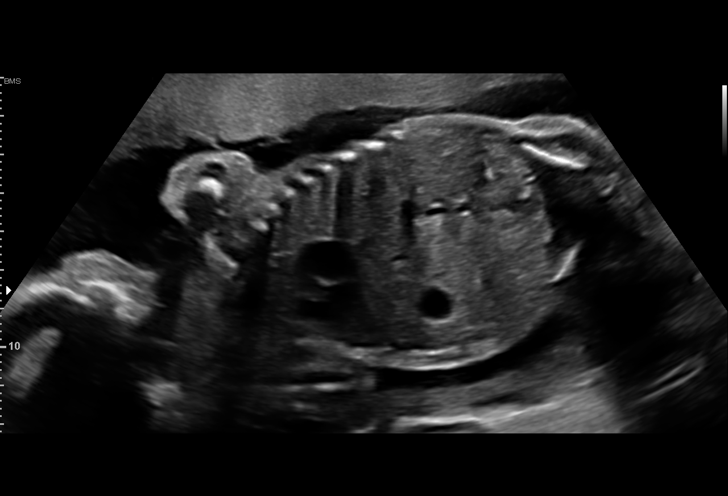
[im 31/91]
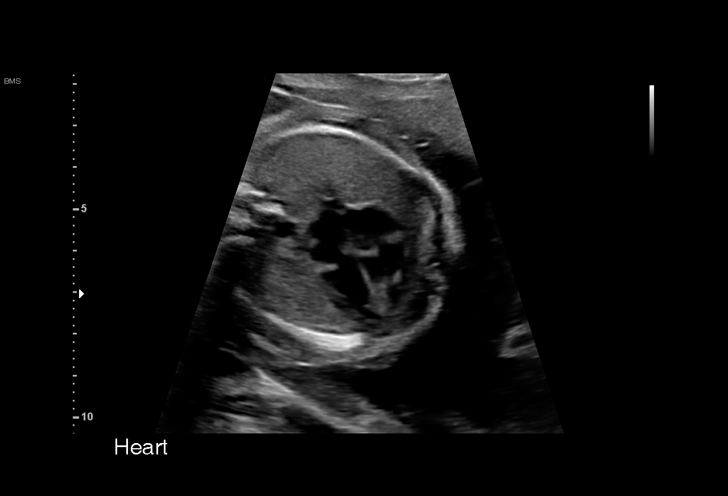
[im 37/91]
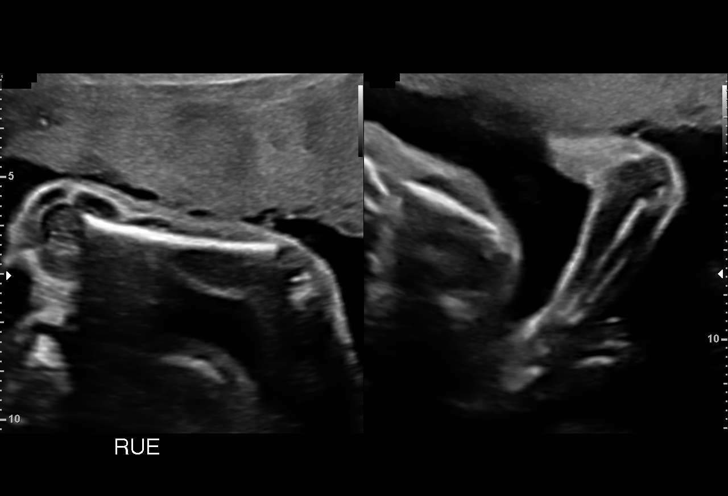
[im 47/91]
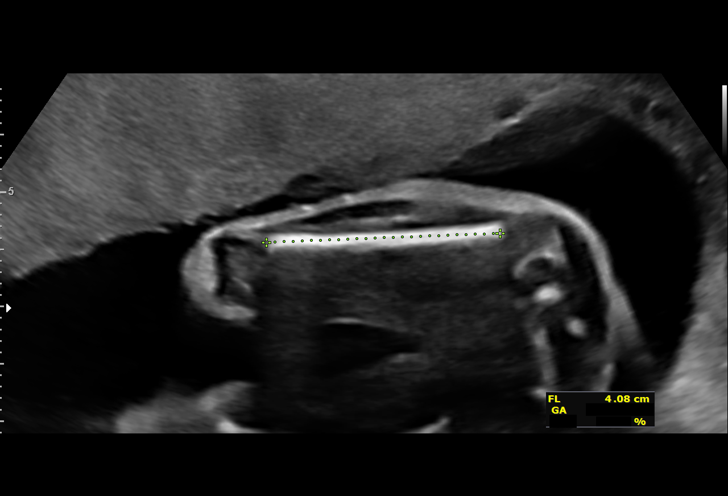
[im 54/91]
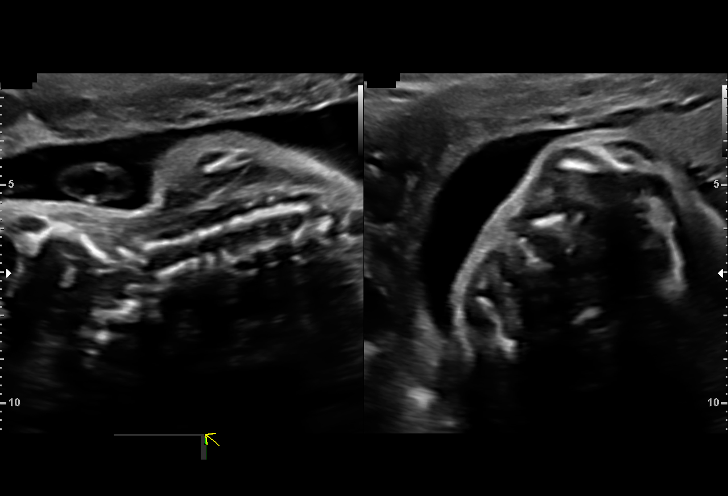
[im 61/91]
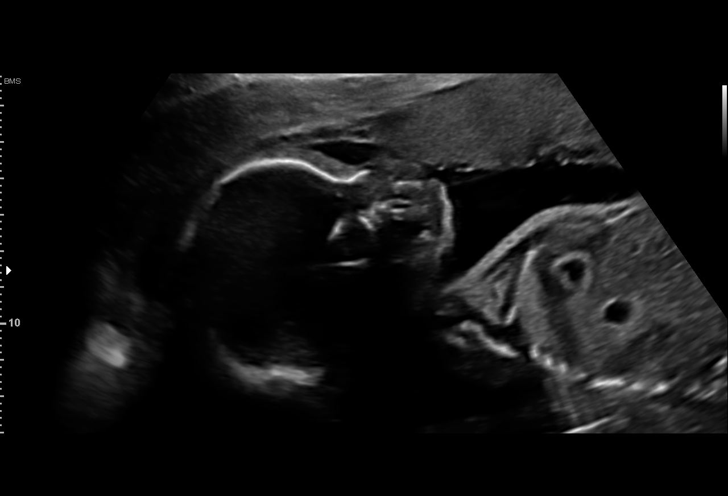
[im 67/91]
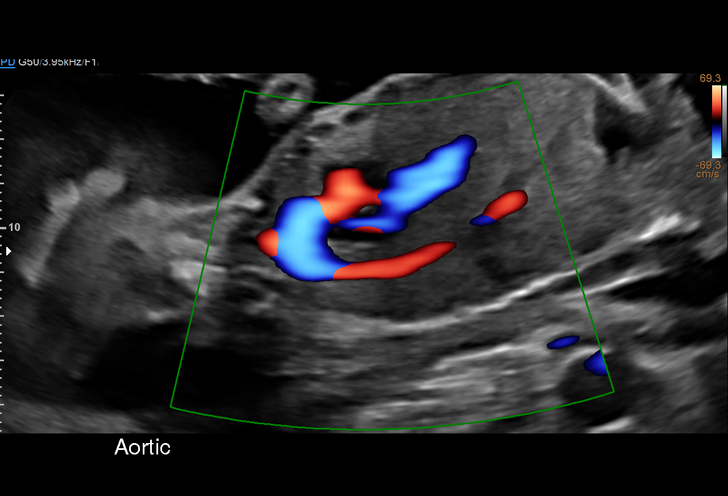
[im 74/91]
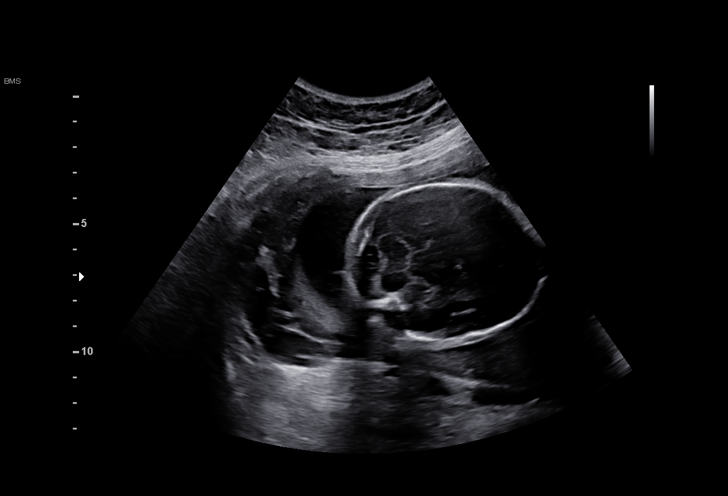
[im 81/91]
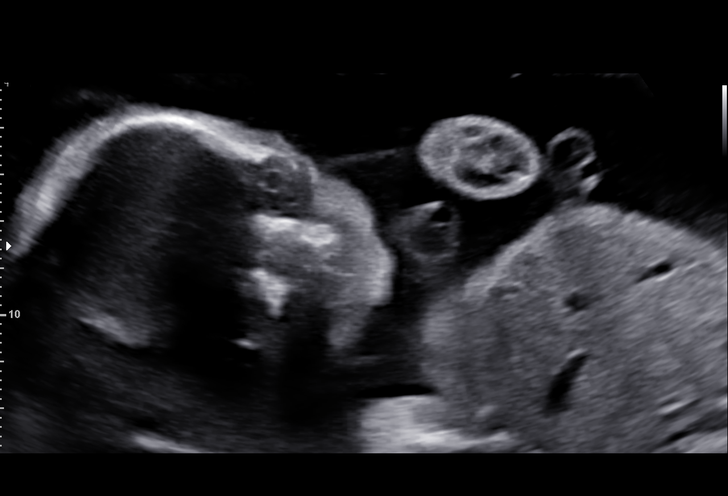
[im 87/91]
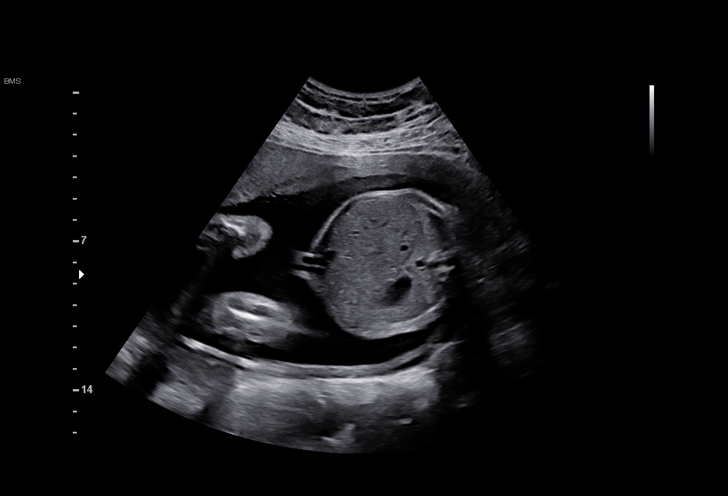

[13 of 28 positions shown; findings below may reference images not displayed]

Indications

 Advanced maternal age multigravida 35+,
 second trimester
 Obesity complicating pregnancy, second
 trimester (BMI 45)
 Uterine fibroids affecting pregnancy in        O34.12,
 second trimester, antepartum
 24 weeks gestation of pregnancy
 Encounter for antenatal screening for
 malformations
 Genetic carrier (Melazim Cangelosi)
Fetal Evaluation

 Num Of Fetuses:         1
 Fetal Heart Rate(bpm):  144
 Cardiac Activity:       Observed
 Presentation:           Cephalic
 Placenta:               Anterior
 P. Cord Insertion:      Visualized

 Amniotic Fluid
 AFI FV:      Polyhydramnios

                             Largest Pocket(cm)

Biometry
 BPD:      60.8  mm     G. Age:  24w 5d         49  %    CI:        71.64   %    70 - 86
                                                         FL/HC:      17.9   %    18.7 -
 HC:      228.7  mm     G. Age:  24w 6d         42  %    HC/AC:      1.05        1.04 -
 AC:      218.2  mm     G. Age:  26w 2d         88  %    FL/BPD:     67.3   %    71 - 87
 FL:       40.9  mm     G. Age:  23w 2d          7  %    FL/AC:      18.7   %    20 - 24
 HUM:      39.2  mm     G. Age:  24w 0d         28  %
 LV:        7.9  mm

 Est. FW:     757  gm    1 lb 11 oz      60  %
OB History

 Gravidity:    3         Term:   2        Prem:   0        SAB:   0
 TOP:          0       Ectopic:  0        Living: 2
Gestational Age

 LMP:           22w 4d        Date:  01/13/21                 EDD:   10/20/21
 U/S Today:     24w 6d                                        EDD:   10/04/21
 Best:          24w 4d     Det. By:  U/S  (05/23/21)          EDD:   10/06/21
Anatomy

 Cranium:               Appears normal         Aortic Arch:            Appears normal
 Cavum:                 Appears normal         Ductal Arch:            Appears normal
 Ventricles:            Appears normal         Diaphragm:              Appears normal
 Choroid Plexus:        Previously seen        Stomach:                Appears normal, left
                                                                       sided
 Cerebellum:            Previously seen        Abdomen:                Appears normal
 Posterior Fossa:       Previously seen        Abdominal Wall:         Previously seen
 Nuchal Fold:           Previously seen        Cord Vessels:           Previously seen
 Face:                  Profile appears        Kidneys:                Appear normal
                        normal, Orbits prev
 Lips:                  Previously seen        Bladder:                Appears normal
 Thoracic:              Appears normal         Spine:                  Appears normal
 Heart:                 Appears normal         Upper Extremities:      LUE prev seen,
                        (4CH, axis, and                                RUE appears
                        situs)                                         normal
 RVOT:                  Previously seen        Lower Extremities:      Previously seen
 LVOT:                  Previously seen

 Other:  3VT visualized.
Cervix Uterus Adnexa

 Cervix
 Length:           4.11  cm.
 Normal appearance by transabdominal scan.

 Uterus
 Single fibroid prev visualized

 Right Ovary
 Not visualized.
 Left Ovary
 Within normal limits.

 Cul De Sac
 No free fluid seen.

 Adnexa
 No adnexal mass visualized.
Comments

 This patient was seen for a follow up exam as the views of
 the fetal anatomy were unable to be fully visualized during
 her last exam.  Her pregnancy has also been complicated by
 advanced maternal age, maternal obesity with a BMI of 45,
 and a fibroid uterus.  She reports that she has been
 experiencing extreme sciatic pain that is limiting her from
 being able to perform her daily duties and to ambulate
 normally.
 Based on the fetal biometry measurements obtained today,
 her EDC was changed to October 06, 2021, making her 24
 weeks and 4 days pregnant today.  This EDC is consistent
 with that obtained from her last ultrasound.  If her due date
 was not changed, the fetus would have continued to measure
 greater than the 99th percentile for her gestational age.
 Borderline polyhydramnios was noted.
 The views of the fetal anatomy were visualized today.  There
 were no obvious anomalies noted.
 The limitations of ultrasound in the detection of all anomalies
 was discussed.
 The patient was advised to try a pregnancy support belt for
 relief of pain related to sciatic nerve compression.  Should
 her pain continue despite the use of the support belt and she
 is unable to work because of the significant pain, short-term
 disability may be considered.

 Due to borderline polyhydramnios noted today, she should be
 screened for gestational diabetes at her next prenatal visit.

 A follow-up growth scan was scheduled in 4 weeks.

## 2021-07-18 ENCOUNTER — Ambulatory Visit: Payer: Medicaid Other | Attending: Obstetrics

## 2021-07-18 ENCOUNTER — Other Ambulatory Visit: Payer: Self-pay

## 2021-07-18 ENCOUNTER — Encounter: Payer: Self-pay | Admitting: *Deleted

## 2021-07-18 ENCOUNTER — Other Ambulatory Visit: Payer: Medicaid Other

## 2021-07-18 ENCOUNTER — Other Ambulatory Visit: Payer: Self-pay | Admitting: General Practice

## 2021-07-18 ENCOUNTER — Other Ambulatory Visit: Payer: Self-pay | Admitting: *Deleted

## 2021-07-18 ENCOUNTER — Ambulatory Visit: Payer: Medicaid Other | Admitting: *Deleted

## 2021-07-18 VITALS — BP 106/57 | HR 113

## 2021-07-18 DIAGNOSIS — Z8616 Personal history of COVID-19: Secondary | ICD-10-CM | POA: Diagnosis present

## 2021-07-18 DIAGNOSIS — O3412 Maternal care for benign tumor of corpus uteri, second trimester: Secondary | ICD-10-CM

## 2021-07-18 DIAGNOSIS — O99210 Obesity complicating pregnancy, unspecified trimester: Secondary | ICD-10-CM

## 2021-07-18 DIAGNOSIS — O99212 Obesity complicating pregnancy, second trimester: Secondary | ICD-10-CM

## 2021-07-18 DIAGNOSIS — Z6841 Body Mass Index (BMI) 40.0 and over, adult: Secondary | ICD-10-CM | POA: Insufficient documentation

## 2021-07-18 DIAGNOSIS — O0990 Supervision of high risk pregnancy, unspecified, unspecified trimester: Secondary | ICD-10-CM

## 2021-07-18 DIAGNOSIS — Z3A28 28 weeks gestation of pregnancy: Secondary | ICD-10-CM

## 2021-07-18 DIAGNOSIS — O09522 Supervision of elderly multigravida, second trimester: Secondary | ICD-10-CM | POA: Diagnosis present

## 2021-07-18 DIAGNOSIS — O99213 Obesity complicating pregnancy, third trimester: Secondary | ICD-10-CM

## 2021-07-18 DIAGNOSIS — J45909 Unspecified asthma, uncomplicated: Secondary | ICD-10-CM

## 2021-07-18 DIAGNOSIS — O403XX0 Polyhydramnios, third trimester, not applicable or unspecified: Secondary | ICD-10-CM

## 2021-07-18 DIAGNOSIS — D259 Leiomyoma of uterus, unspecified: Secondary | ICD-10-CM

## 2021-07-18 DIAGNOSIS — O99511 Diseases of the respiratory system complicating pregnancy, first trimester: Secondary | ICD-10-CM | POA: Diagnosis present

## 2021-07-18 IMAGING — US US MFM OB FOLLOW-UP
1 series · 13 of 28 positions shown · non-contrast
Comparison: none

[Series 1: us mfm ob follow-up · 13 of 45 slices shown]
[im 2/45]
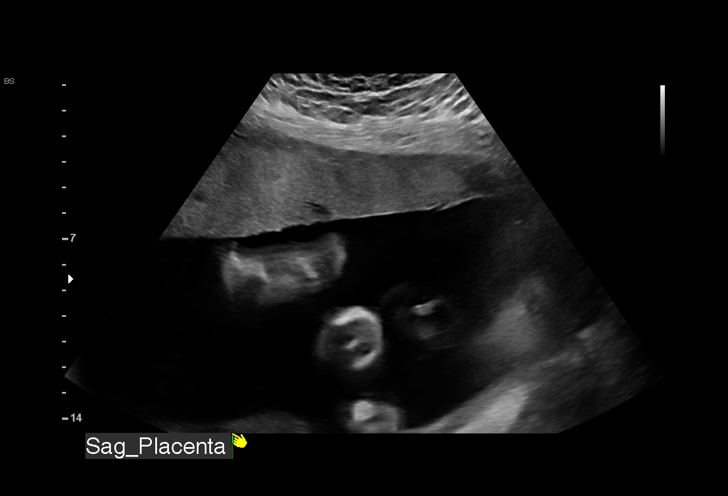
[im 5/45]
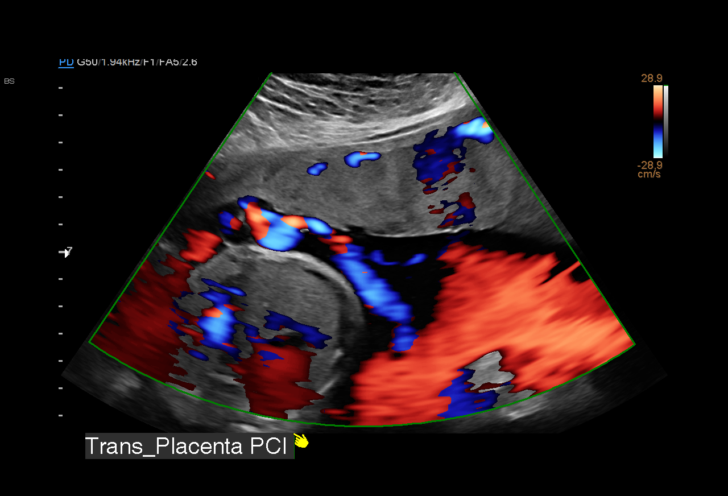
[im 9/45]
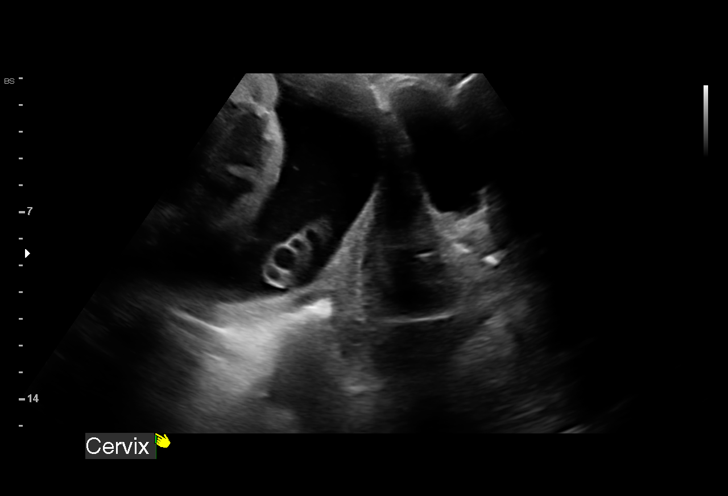
[im 12/45]
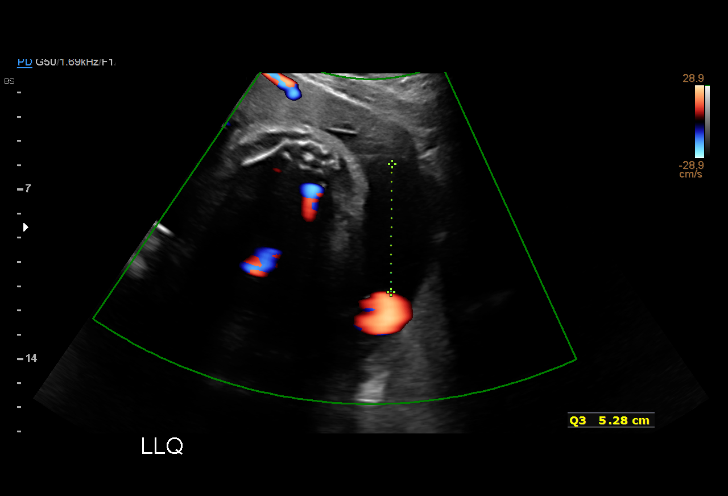
[im 15/45]
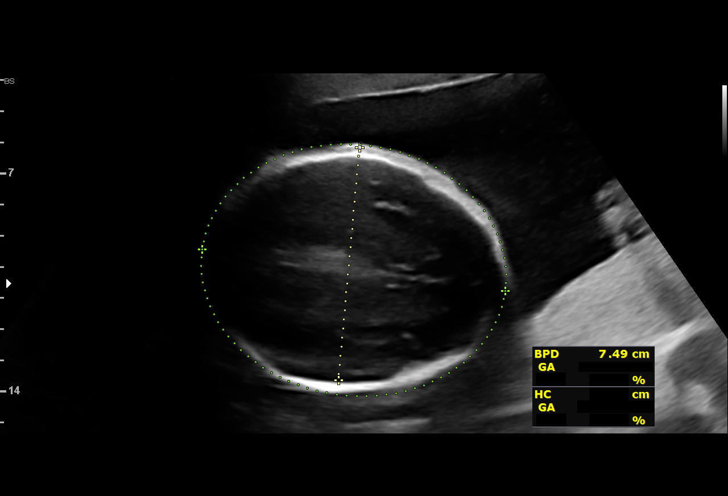
[im 18/45]
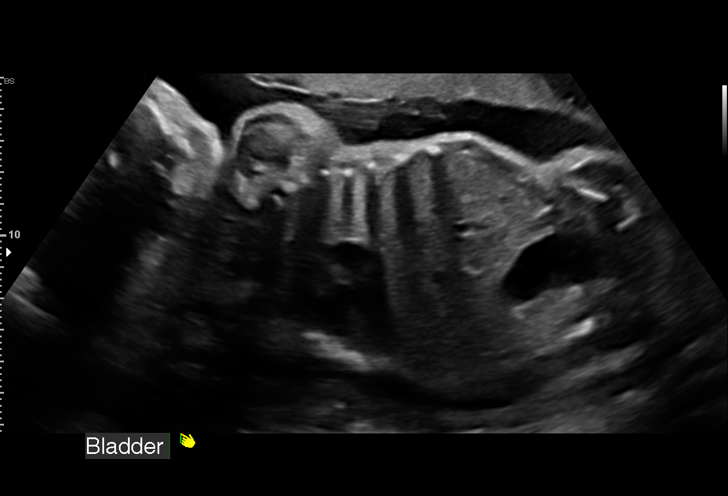
[im 23/45]
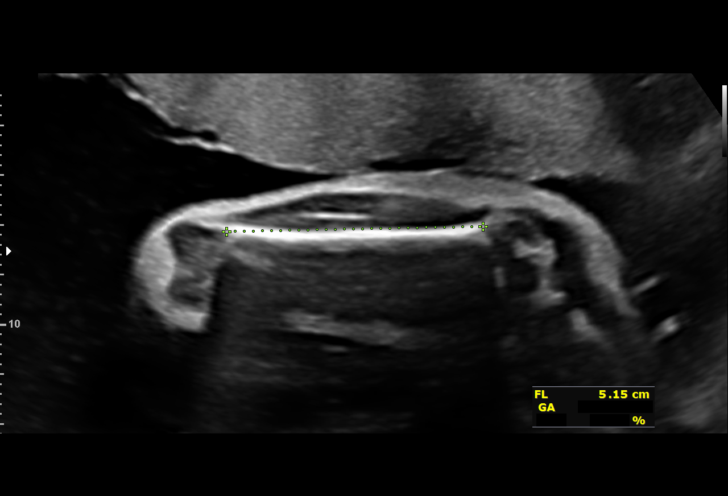
[im 27/45]
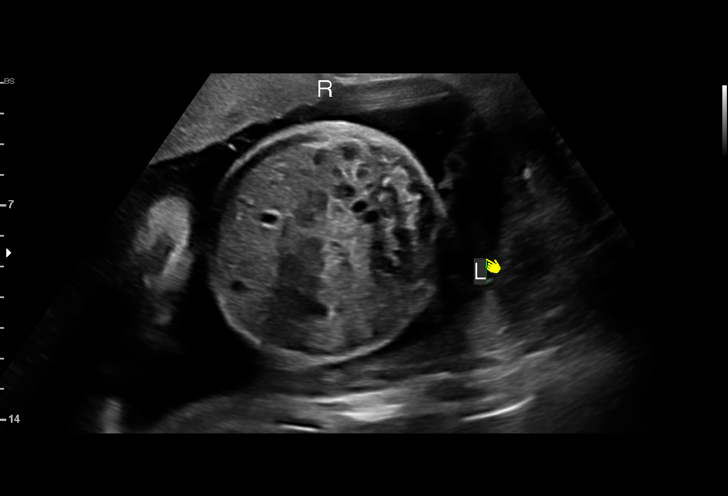
[im 30/45]
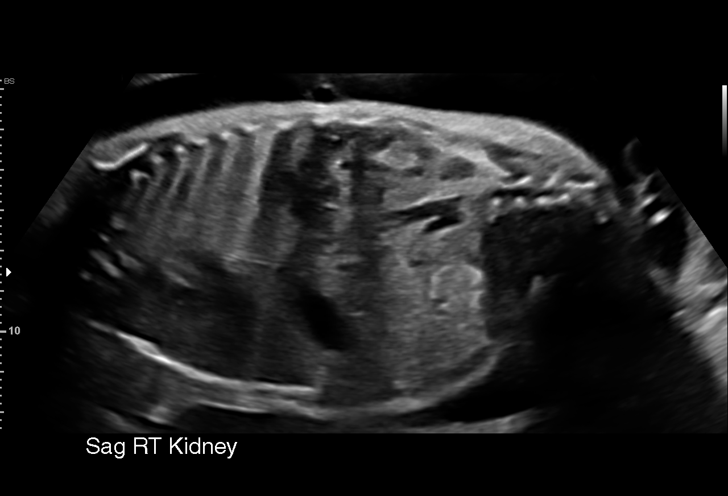
[im 33/45]
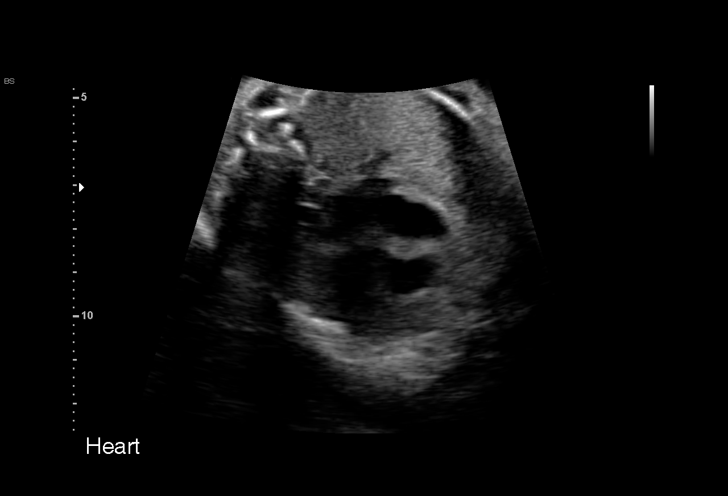
[im 36/45]
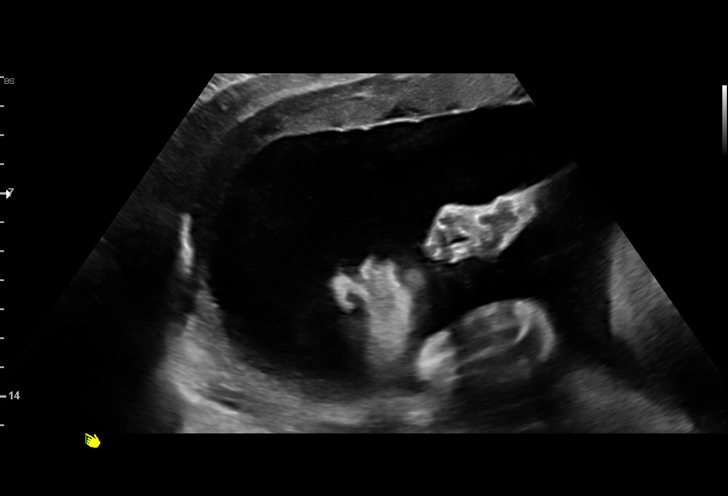
[im 40/45]
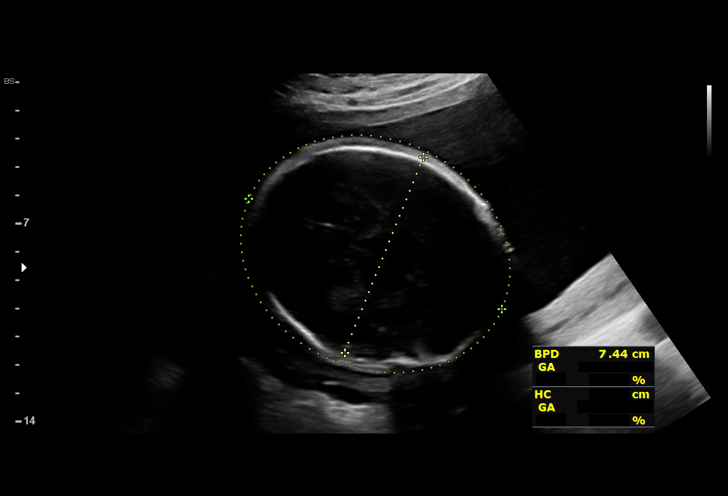
[im 43/45]
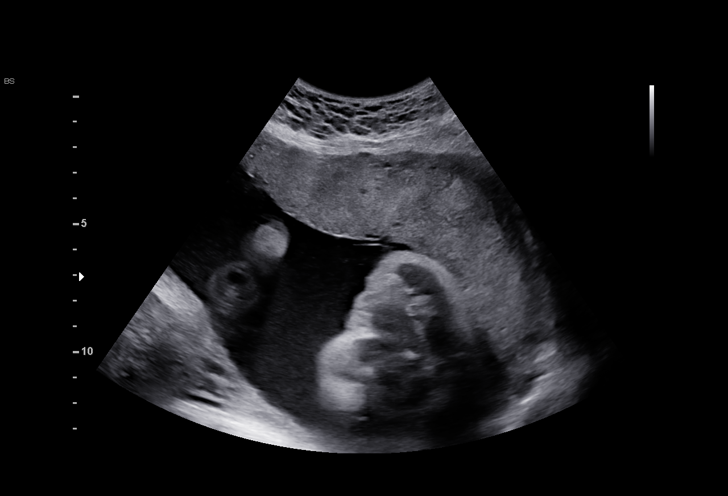

[13 of 28 positions shown; findings below may reference images not displayed]

Indications

 Advanced maternal age multigravida 35+,
 second trimester
 Obesity complicating pregnancy, second
 trimester (BMI 45)
 Uterine fibroids affecting pregnancy in        O34.12,
 second trimester, antepartum
 Encounter for antenatal screening for
 malformations
 Genetic carrier (Adenyo Ologo)
 28 weeks gestation of pregnancy
Fetal Evaluation

 Num Of Fetuses:         1
 Fetal Heart Rate(bpm):  136
 Cardiac Activity:       Observed
 Presentation:           Breech
 Placenta:               Anterior
 P. Cord Insertion:      Visualized

 Amniotic Fluid
 AFI FV:      Polyhydramnios

 AFI Sum(cm)     %Tile       Largest Pocket(cm)
 26.75           > 97

 RUQ(cm)       RLQ(cm)       LUQ(cm)        LLQ(cm)

Biometry

 BPD:      74.8  mm     G. Age:  30w 0d         81  %    CI:        71.54   %    70 - 86
                                                         FL/HC:      18.3   %    19.6 -
 HC:      281.6  mm     G. Age:  30w 6d         84  %    HC/AC:      1.12        0.99 -
 AC:      251.8  mm     G. Age:  29w 3d         68  %    FL/BPD:     68.7   %    71 - 87
 FL:       51.4  mm     G. Age:  27w 3d         11  %    FL/AC:      20.4   %    20 - 24

 LV:        5.3  mm

 Est. FW:    5454  gm    2 lb 14 oz      51  %
OB History

 Gravidity:    3         Term:   2        Prem:   0        SAB:   0
 TOP:          0       Ectopic:  0        Living: 2
Gestational Age

 LMP:           26w 4d        Date:  01/13/21                 EDD:   10/20/21
 U/S Today:     29w 3d                                        EDD:   09/30/21
 Best:          28w 4d     Det. By:  U/S  (05/23/21)          EDD:   10/06/21
Anatomy

 Cranium:               Appears normal         Aortic Arch:            Previously seen
 Cavum:                 Appears normal         Ductal Arch:            Previously seen
 Ventricles:            Appears normal         Diaphragm:              Appears normal
 Choroid Plexus:        Previously seen        Stomach:                Appears normal, left
                                                                       sided
 Cerebellum:            Previously seen        Abdomen:                Appears normal
 Posterior Fossa:       Previously seen        Abdominal Wall:         Previously seen
 Nuchal Fold:           Previously seen        Cord Vessels:           Previously seen
 Face:                  Orbits and profile     Kidneys:                Appear normal
                        previously seen
 Lips:                  Previously seen        Bladder:                Appears normal
 Thoracic:              Previously seen        Spine:                  Previously seen
 Heart:                 Previously seen        Upper Extremities:      Previously seen
 RVOT:                  Previously seen        Lower Extremities:      Previously seen
 LVOT:                  Previously seen

 Other:  3VT prev visualized.
Cervix Uterus Adnexa

 Cervix
 Not visualized (advanced GA >60wks)

 Uterus
 Single fibroid noted, see table below.

 Right Ovary
 Not visualized.

 Left Ovary
 Within normal limits.
 Cul De Sac
 No free fluid seen.

 Adnexa
 No adnexal mass visualized.
Myomas

 Site                     L(cm)      W(cm)      D(cm)       Location
 Posterior Lt

 Blood Flow                  RI       PI       Comments

Comments

 This patient was seen for a follow up growth scan due to
 maternal obesity with a BMI of 46, fibroid uterus, and
 borderline polyhydramnios that was noted on her prior exam.
 The patient has not had a prenatal visit since early
 [DATE] or 3 months).  The patient reports that she is
 busy at work and has not been able to come for her visits.
 She has not been screened for gestational diabetes.
 She was informed that the fetal growth appears appropriate
 for her gestational age.  Mild polyhydramnios with a total AFI
 of 26.8 cm was noted.
 Due to polyhydramnios noted today, the patient was advised
 that she may have undiagnosed gestational diabetes.  As the
 patient reports that she is off from work today, our nurse will
 help her schedule a 1 hour glucose challenge test later this
 afternoon.
 Due to polyhydramnios, we will start weekly fetal testing at 30
 weeks.
 A biophysical profile was scheduled in 2 weeks.
 The patient was encouraged to attend all of her prenatal visits.

## 2021-07-19 LAB — CBC
Hematocrit: 32.7 % — ABNORMAL LOW (ref 34.0–46.6)
Hemoglobin: 10.7 g/dL — ABNORMAL LOW (ref 11.1–15.9)
MCH: 25.5 pg — ABNORMAL LOW (ref 26.6–33.0)
MCHC: 32.7 g/dL (ref 31.5–35.7)
MCV: 78 fL — ABNORMAL LOW (ref 79–97)
Platelets: 261 10*3/uL (ref 150–450)
RBC: 4.2 x10E6/uL (ref 3.77–5.28)
RDW: 14 % (ref 11.7–15.4)
WBC: 6.9 10*3/uL (ref 3.4–10.8)

## 2021-07-19 LAB — RPR: RPR Ser Ql: NONREACTIVE

## 2021-07-19 LAB — HIV ANTIBODY (ROUTINE TESTING W REFLEX): HIV Screen 4th Generation wRfx: NONREACTIVE

## 2021-07-19 LAB — GLUCOSE TOLERANCE, 1 HOUR: Glucose, 1Hr PP: 160 mg/dL (ref 70–199)

## 2021-07-20 ENCOUNTER — Telehealth: Payer: Self-pay

## 2021-07-20 NOTE — Telephone Encounter (Addendum)
-----   Message from Donnamae Jude, MD sent at 07/19/2021 10:11 AM EST ----- Abnormal 1 hour--return for 2 hour  Called pt; VM left stating pt should read MyChart message or return call at [phone]. Explained I am calling regarding results and new appt.

## 2021-07-26 ENCOUNTER — Encounter: Payer: Self-pay | Admitting: Radiology

## 2021-07-27 ENCOUNTER — Other Ambulatory Visit: Payer: Self-pay | Admitting: *Deleted

## 2021-07-27 DIAGNOSIS — O0990 Supervision of high risk pregnancy, unspecified, unspecified trimester: Secondary | ICD-10-CM

## 2021-08-01 ENCOUNTER — Other Ambulatory Visit: Payer: Medicaid Other

## 2021-08-01 ENCOUNTER — Other Ambulatory Visit: Payer: Self-pay

## 2021-08-01 ENCOUNTER — Ambulatory Visit: Payer: Medicaid Other | Admitting: *Deleted

## 2021-08-01 ENCOUNTER — Encounter: Payer: Self-pay | Admitting: Family Medicine

## 2021-08-01 ENCOUNTER — Ambulatory Visit (INDEPENDENT_AMBULATORY_CARE_PROVIDER_SITE_OTHER): Payer: Medicaid Other | Admitting: Family Medicine

## 2021-08-01 ENCOUNTER — Encounter: Payer: Self-pay | Admitting: *Deleted

## 2021-08-01 ENCOUNTER — Ambulatory Visit: Payer: Medicaid Other | Attending: Obstetrics and Gynecology

## 2021-08-01 VITALS — BP 113/65 | HR 89

## 2021-08-01 VITALS — BP 111/65 | HR 102 | Wt 249.2 lb

## 2021-08-01 DIAGNOSIS — O99210 Obesity complicating pregnancy, unspecified trimester: Secondary | ICD-10-CM

## 2021-08-01 DIAGNOSIS — J45909 Unspecified asthma, uncomplicated: Secondary | ICD-10-CM | POA: Diagnosis present

## 2021-08-01 DIAGNOSIS — O99213 Obesity complicating pregnancy, third trimester: Secondary | ICD-10-CM | POA: Diagnosis not present

## 2021-08-01 DIAGNOSIS — O99511 Diseases of the respiratory system complicating pregnancy, first trimester: Secondary | ICD-10-CM | POA: Diagnosis present

## 2021-08-01 DIAGNOSIS — M5432 Sciatica, left side: Secondary | ICD-10-CM

## 2021-08-01 DIAGNOSIS — O0990 Supervision of high risk pregnancy, unspecified, unspecified trimester: Secondary | ICD-10-CM | POA: Insufficient documentation

## 2021-08-01 DIAGNOSIS — E669 Obesity, unspecified: Secondary | ICD-10-CM

## 2021-08-01 DIAGNOSIS — O3410 Maternal care for benign tumor of corpus uteri, unspecified trimester: Secondary | ICD-10-CM

## 2021-08-01 DIAGNOSIS — O403XX0 Polyhydramnios, third trimester, not applicable or unspecified: Secondary | ICD-10-CM

## 2021-08-01 DIAGNOSIS — Z3A30 30 weeks gestation of pregnancy: Secondary | ICD-10-CM

## 2021-08-01 DIAGNOSIS — Z5941 Food insecurity: Secondary | ICD-10-CM

## 2021-08-01 DIAGNOSIS — O409XX0 Polyhydramnios, unspecified trimester, not applicable or unspecified: Secondary | ICD-10-CM | POA: Insufficient documentation

## 2021-08-01 DIAGNOSIS — O09523 Supervision of elderly multigravida, third trimester: Secondary | ICD-10-CM | POA: Diagnosis not present

## 2021-08-01 DIAGNOSIS — Z8616 Personal history of COVID-19: Secondary | ICD-10-CM

## 2021-08-01 DIAGNOSIS — O99810 Abnormal glucose complicating pregnancy: Secondary | ICD-10-CM

## 2021-08-01 DIAGNOSIS — D259 Leiomyoma of uterus, unspecified: Secondary | ICD-10-CM

## 2021-08-01 IMAGING — US US MFM FETAL BPP W/O NON-STRESS
1 series · 14 of 24 positions shown · non-contrast
Comparison: none

[Series 1: us mfm fetal bpp w/o non-stress · 24 acquisitions, 14 frames shown]
[im 1/24]
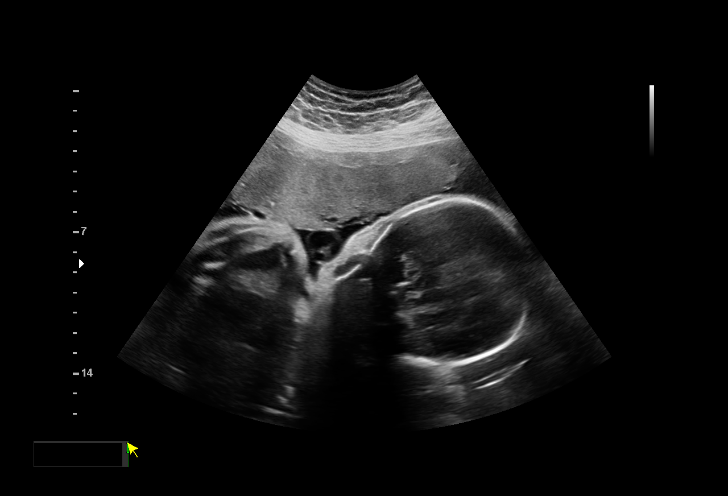
[im 3/24]
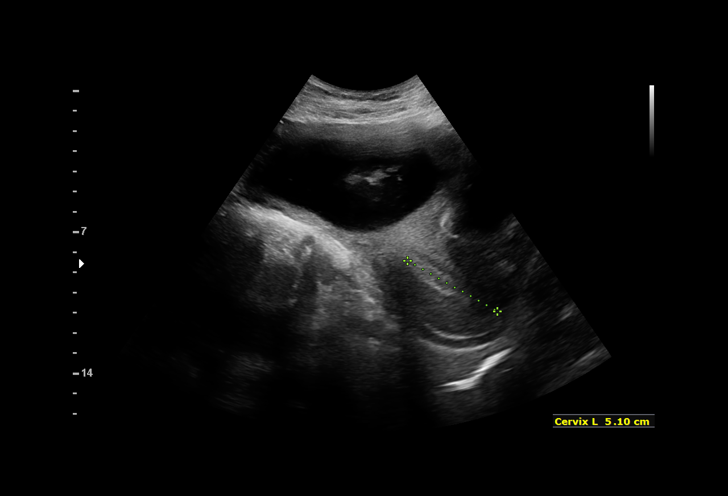
[im 5/24]
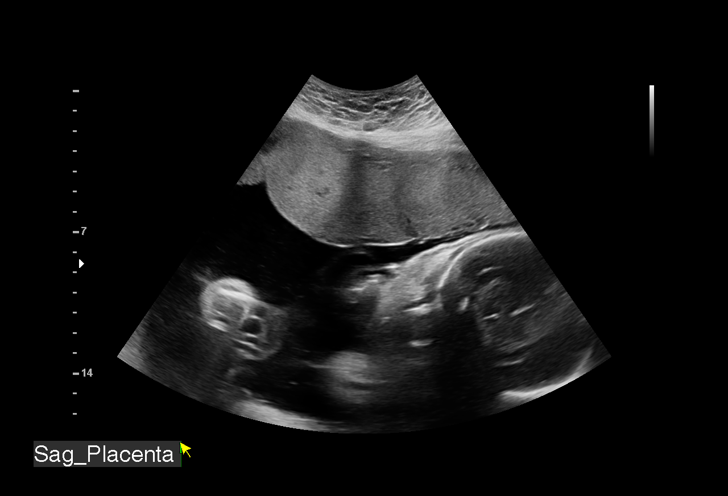
[im 7/24]
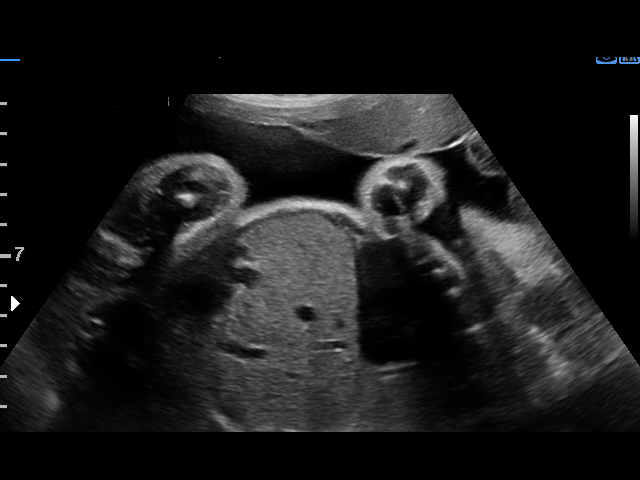
[im 8/24]
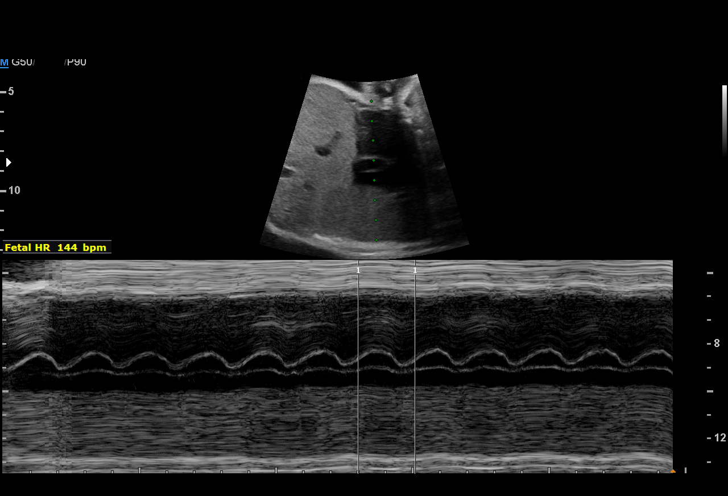
[im 10/24]
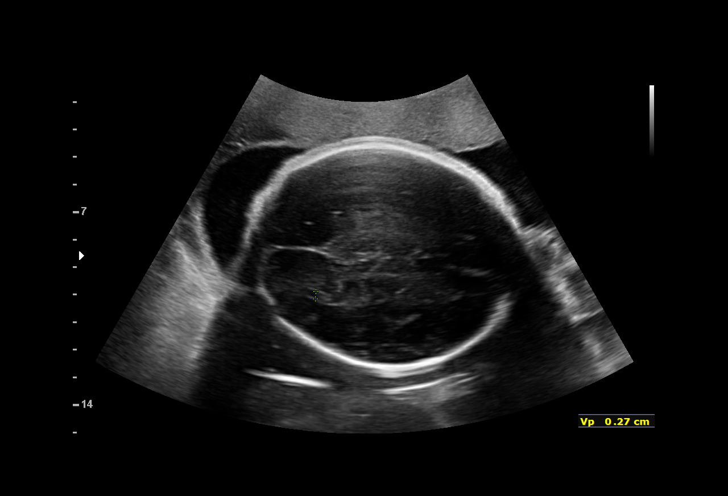
[im 12/24]
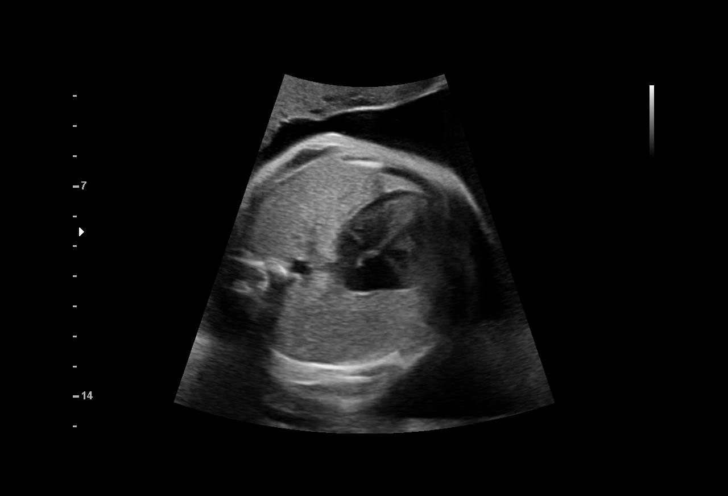
[im 13/24]
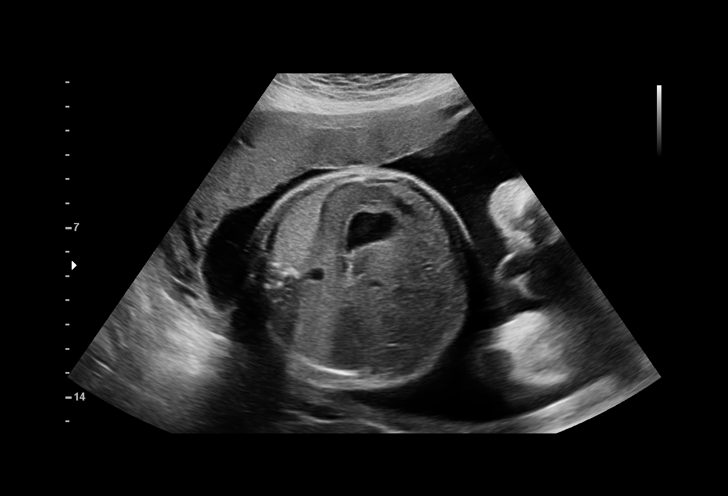
[im 15/24]
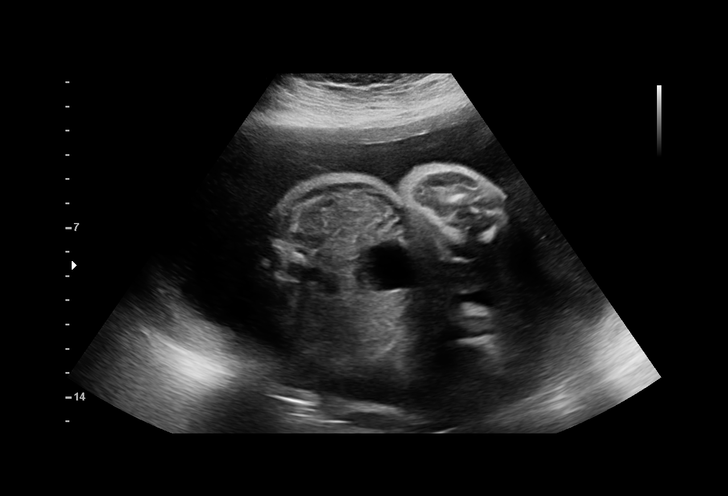
[im 17/24]
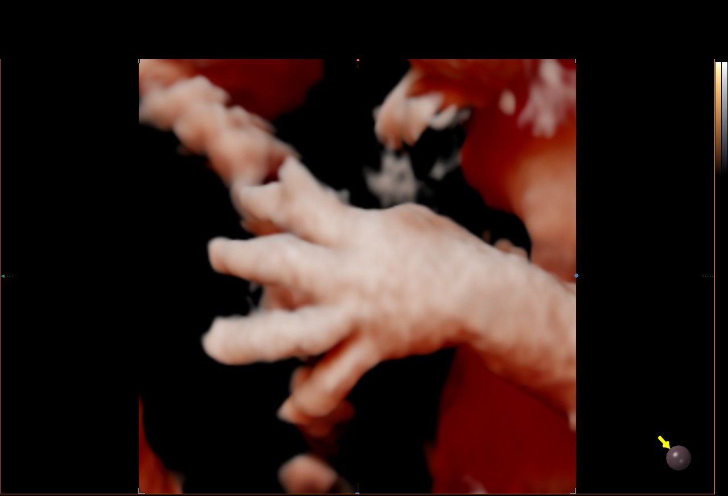
[im 19/24]
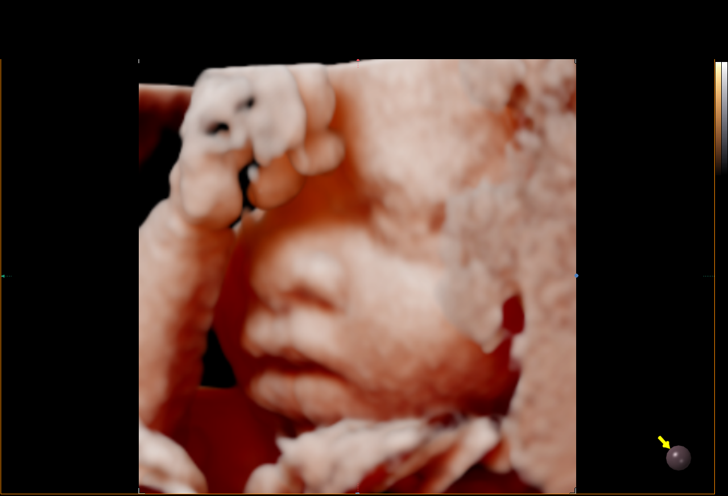
[im 20/24]
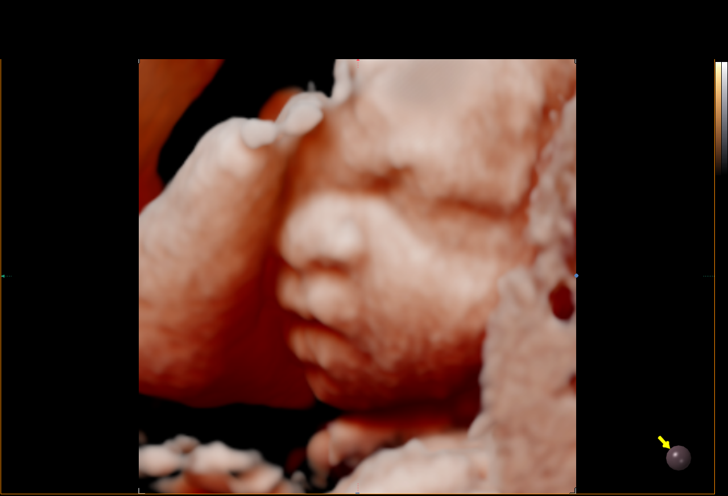
[im 22/24]
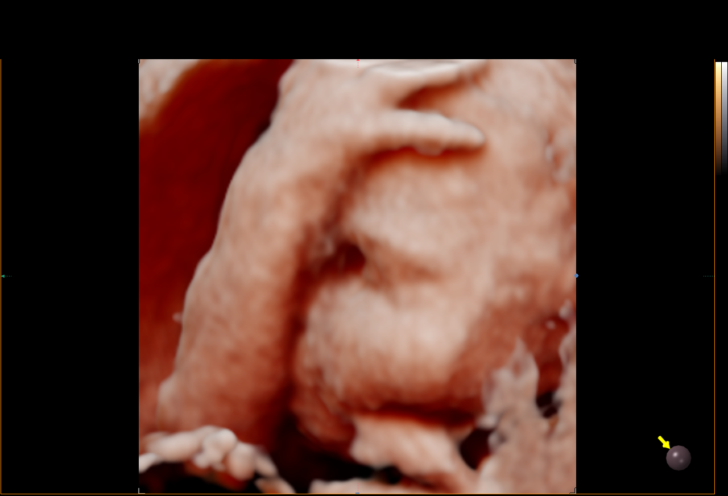
[im 24/24]
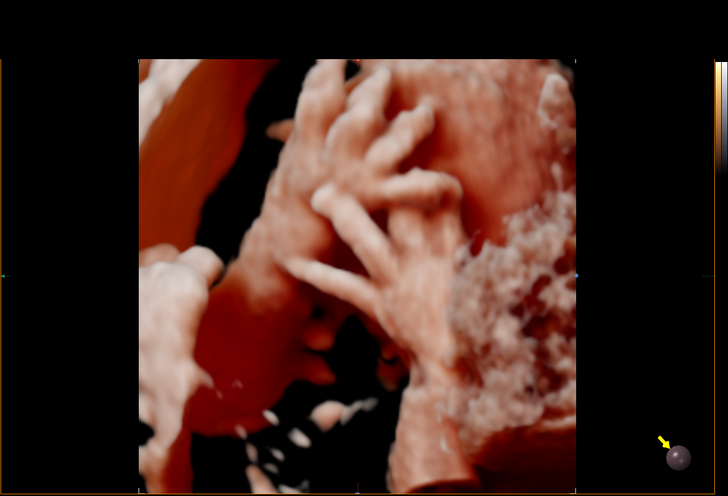

[14 of 24 positions shown; findings below may reference images not displayed]

Indications

 Polyhydramnios, third trimester, antepartum
 condition or complication, unspecified fetus
 Advanced maternal age multigravida 35+,
 third trimester
 30 weeks gestation of pregnancy
 Obesity complicating pregnancy, third
 trimester BMI 35
 Uterine fibroids affecting pregnancy in third  O34.13,
 trimester, antepartum
 Encounter for antenatal screening for
 malformations
 Genetic carrier (Greg Shafer)
Fetal Evaluation

 Num Of Fetuses:         1
 Fetal Heart Rate(bpm):  144
 Cardiac Activity:       Observed
 Presentation:           Cephalic
 Placenta:               Anterior
 P. Cord Insertion:      Previously Visualized

 Amniotic Fluid
 AFI FV:      Polyhydramnios

 AFI Sum(cm)     %Tile       Largest Pocket(cm)
 32.28           > 97
 RUQ(cm)       RLQ(cm)       LUQ(cm)        LLQ(cm)

Biophysical Evaluation

 Amniotic F.V:   Pocket => 2 cm             F. Tone:        Observed
 F. Movement:    Observed                   Score:          [DATE]
 F. Breathing:   Observed
Biometry

 LV:        2.7  mm
OB History

 Gravidity:    3         Term:   2        Prem:   0        SAB:   0
 TOP:          0       Ectopic:  0        Living: 2
Gestational Age

 LMP:           28w 4d        Date:  01/13/21                 EDD:   10/20/21
 Best:          30w 4d     Det. By:  U/S  (05/23/21)          EDD:   10/06/21
Anatomy

 Ventricles:            Appears normal         Kidneys:                Appear normal
 Heart:                 Appears normal         Bladder:                Appears normal
                        (4CH, axis, and
                        situs)
 Stomach:               Appears normal, left
                        sided
Cervix Uterus Adnexa

 Cervix
 Length:            5.1  cm.
 Not visualized (advanced GA >65wks)

 Right Ovary
 Not visualized.

 Left Ovary
 Not visualized.
Impression

 Patient with polyhydramnios return for antenatal testing.  She
 had abnormal 1 hour glucose challenge test (160 mg/DL) and
 had glucose tolerance test today.

 Severe polyhydramnios seen (AFI 32 cm).  Good fetal activity
 seen.Antenatal testing is reassuring. BPP [DATE].  Cephalic
 presentation.
Recommendations

 Patient has appointments for weekly BPP.
                 Asif, Nicholas

## 2021-08-01 MED ORDER — PRENATAL 28-0.8 MG PO TABS: 1.0000 | ORAL_TABLET | Freq: Every day | ORAL | 3 refills | Status: DC

## 2021-08-01 MED ORDER — CYCLOBENZAPRINE HCL 5 MG PO TABS: 5.0000 mg | ORAL_TABLET | Freq: Two times a day (BID) | ORAL | 0 refills | Status: DC | PRN

## 2021-08-01 NOTE — Patient Instructions (Signed)
You can take tylenol 1000mg  up to 4 times daily.   I will send stretches and exercises to try. I will also place a referral to PT.

## 2021-08-01 NOTE — Progress Notes (Signed)
Patient reports sciatic nerve pain and stated the past Monday she feel on left side but denies any vaginal bleeding or DFM

## 2021-08-01 NOTE — Progress Notes (Signed)
Subjective:  Sandra Krueger is a 35 y.o. G3P2002 at [redacted]w[redacted]d being seen today for ongoing prenatal care.  She is currently monitored for the following issues for this high-risk pregnancy and has BMI 40.0-44.9, adult ([place]); Uterine fibroid in pregnancy; Supervision of high risk pregnancy, antepartum; AMA (advanced maternal age) multigravida 66+; History of COVID-19; Asthma affecting pregnancy in first trimester; Obesity in pregnancy; COVID-19 affecting pregnancy in first trimester; Low TSH level; Alpha thalassemia silent carrier; LGA (large for gestational age) fetus affecting management of mother; Polyhydramnios affecting pregnancy; and Abnormal glucose tolerance in pregnancy on their problem list.  Patient reports  left sided sciatica that has been present for several weeks .  She has been seen in [place] (where she prominently stays) a few times for this concern, feels like it has been getting worse as she progresses in her pregnancy. Reports pain/burning/tingling from her left hip/buttock down to usually her knee posteriorly. Her left leg seems to "give out" a lot, has had a few falls bc of it. Denies any extremity weakness, persistent numbness, bowel/bladder difficulty or incontinence, low back pain, or saddle anesthesia. She has not tried anything with exception of some exercises at home. Had same thing in last pregnancy. Contractions: Not present. Pain/pressure: Not present  Movement: Present. Denies leaking of fluid.   The following portions of the patient's history were reviewed and updated as appropriate: allergies, current medications, past family history, past medical history, past social history, past surgical history and problem list.   Objective:   Vitals:   08/01/21 0923  BP: 111/65  Pulse: (!) 102  Weight: 249 lb 3.2 oz (113 kg)    Fetal Status: Fetal Heart Rate (bpm): 131 Fundal Height: 31 cm Movement: Present     General:  Alert, oriented and cooperative. Patient is in no  acute distress.  Skin: Skin is warm and dry. No rash noted.   Cardiovascular: Normal heart rate noted  Respiratory: Normal respiratory effort, no problems with respiration noted  Abdomen: Soft, gravid, appropriate for gestational age. Pain/Pressure: Absent     Pelvic:  Cervical exam deferred        Extremities:  Lumbar spine: - Inspection: no gross deformity or asymmetry, swelling  - Palpation: No TTP over the spinous processes. Tenderness over left buttock around piriformis.  - ROM: full active ROM of the lumbar spine in flexion and extension - Strength: 5/5 strength of lower extremity in L4-S1 nerve root distributions b/l - Neuro: sensation intact in the L4-S1 nerve root distribution b/l - Special testing: Negative straight leg raise on both sides   Mental Status: Normal mood and affect. Normal behavior. Normal judgment and thought content.   Assessment and Plan:  Pregnancy: G3P2002 at [redacted]w[redacted]d  1. Supervision of high risk pregnancy, antepartum Doing well overall, normal fetal movement.  - Prenatal 28-0.8 MG TABS; Take 1 tablet by mouth daily.  Dispense: 30 tablet; Refill: 3  2. [redacted] weeks gestation of pregnancy  3. Sciatica of left side Impacting ADL's. Overall reassuring MSK exam today (does appear uncomfortable with position changes), but clinical presentation still consistent with above. No red flags. Sent with gentle exercises/stretches and will place PT referral. Strict MAU precautions discussed. Trial flexeril/tylenol.  - cyclobenzaprine (FLEXERIL) 5 MG tablet; Take 1 tablet (5 mg total) by mouth 2 (two) times daily as needed for muscle spasms.  Dispense: 30 tablet; Refill: 0  4. Polyhydramnios affecting pregnancy Severe poly (AFI 32cm) on today's [place] with MFM. BPP 8/8. Continue  weekly BPP.   5. Abnormal glucose tolerance in pregnancy Failed 1 hr, had two hour completed today. High suspicious for GDM with above findings.   6. Food insecurity - AMBULATORY REFERRAL TO [facility]  7. Uterine fibroid in pregnancy  8. Obesity in pregnancy Pre-gravid BMI 45, already proceeding with f/u US for the above.   Preterm labor symptoms and general obstetric precautions including but not limited to vaginal bleeding, contractions, leaking of fluid and fetal movement were reviewed in detail with the patient. Please refer to After Visit Summary for other counseling recommendations.   Return in about 2 weeks (around 08/15/2021) for HROB.   Patriciaann Clan, DO

## 2021-08-02 LAB — GLUCOSE TOLERANCE, 2 HOURS W/ 1HR
Glucose, 1 hour: 183 mg/dL — ABNORMAL HIGH (ref 70–179)
Glucose, 2 hour: 144 mg/dL (ref 70–152)
Glucose, Fasting: 79 mg/dL (ref 70–91)

## 2021-08-03 ENCOUNTER — Telehealth: Payer: Self-pay

## 2021-08-03 ENCOUNTER — Encounter: Payer: Self-pay | Admitting: Family Medicine

## 2021-08-03 DIAGNOSIS — O24419 Gestational diabetes mellitus in pregnancy, unspecified control: Secondary | ICD-10-CM

## 2021-08-03 MED ORDER — ACCU-CHEK SOFTCLIX LANCETS MISC: 12 refills | Status: DC

## 2021-08-03 MED ORDER — GLUCOSE BLOOD VI STRP: ORAL_STRIP | 12 refills | Status: DC

## 2021-08-03 MED ORDER — ACCU-CHEK GUIDE W/DEVICE KIT: 1.0000 | PACK | Freq: Four times a day (QID) | 0 refills | Status: DC

## 2021-08-03 NOTE — Telephone Encounter (Signed)
Call placed to pt. Spoke with pt. Pt given results and recommendations per Dr Kennon Rounds. Pt verbalized understanding to plan of care. Pt states aunt who is a nurse will show her how to check her blood sugar. Pt advised to check 4 times daily and record and bring BS log to appt. Pt verbalized understanding.  Pt scheduled with [facility] on 08/16/2021 at 1115am. Pt agreeable to date and time of appt.  Supplies sent into pts pharmacy on file.  Colletta Maryland, RN

## 2021-08-03 NOTE — Telephone Encounter (Signed)
-----   Message from Donnamae Jude, MD sent at 08/03/2021  9:28 AM EST ----- Has GDM--please book with diabetes education asap

## 2021-08-09 ENCOUNTER — Encounter: Payer: Self-pay | Admitting: Physical Therapy

## 2021-08-09 ENCOUNTER — Ambulatory Visit: Payer: Medicaid Other | Attending: Family Medicine | Admitting: Physical Therapy

## 2021-08-09 ENCOUNTER — Ambulatory Visit: Payer: Medicaid Other | Attending: Obstetrics and Gynecology

## 2021-08-09 ENCOUNTER — Other Ambulatory Visit: Payer: Self-pay

## 2021-08-09 ENCOUNTER — Ambulatory Visit: Payer: Medicaid Other | Admitting: *Deleted

## 2021-08-09 ENCOUNTER — Encounter: Payer: Self-pay | Admitting: *Deleted

## 2021-08-09 VITALS — BP 107/57 | HR 90

## 2021-08-09 DIAGNOSIS — O0990 Supervision of high risk pregnancy, unspecified, unspecified trimester: Secondary | ICD-10-CM | POA: Diagnosis present

## 2021-08-09 DIAGNOSIS — O99511 Diseases of the respiratory system complicating pregnancy, first trimester: Secondary | ICD-10-CM

## 2021-08-09 DIAGNOSIS — O99210 Obesity complicating pregnancy, unspecified trimester: Secondary | ICD-10-CM | POA: Insufficient documentation

## 2021-08-09 DIAGNOSIS — Z8616 Personal history of COVID-19: Secondary | ICD-10-CM | POA: Insufficient documentation

## 2021-08-09 DIAGNOSIS — O09523 Supervision of elderly multigravida, third trimester: Secondary | ICD-10-CM | POA: Insufficient documentation

## 2021-08-09 DIAGNOSIS — J45909 Unspecified asthma, uncomplicated: Secondary | ICD-10-CM | POA: Insufficient documentation

## 2021-08-09 DIAGNOSIS — O99213 Obesity complicating pregnancy, third trimester: Secondary | ICD-10-CM

## 2021-08-09 DIAGNOSIS — M6281 Muscle weakness (generalized): Secondary | ICD-10-CM | POA: Diagnosis present

## 2021-08-09 DIAGNOSIS — M5442 Lumbago with sciatica, left side: Secondary | ICD-10-CM | POA: Insufficient documentation

## 2021-08-09 DIAGNOSIS — O403XX0 Polyhydramnios, third trimester, not applicable or unspecified: Secondary | ICD-10-CM | POA: Diagnosis present

## 2021-08-09 DIAGNOSIS — R293 Abnormal posture: Secondary | ICD-10-CM | POA: Insufficient documentation

## 2021-08-09 DIAGNOSIS — M5432 Sciatica, left side: Secondary | ICD-10-CM | POA: Insufficient documentation

## 2021-08-09 IMAGING — US US MFM FETAL BPP W/O NON-STRESS
1 series · 15 of 19 positions shown · non-contrast
Comparison: none

[Series 1: us mfm fetal bpp w/o non-stress · 19 acquisitions, 15 frames shown]
[im 1/19]
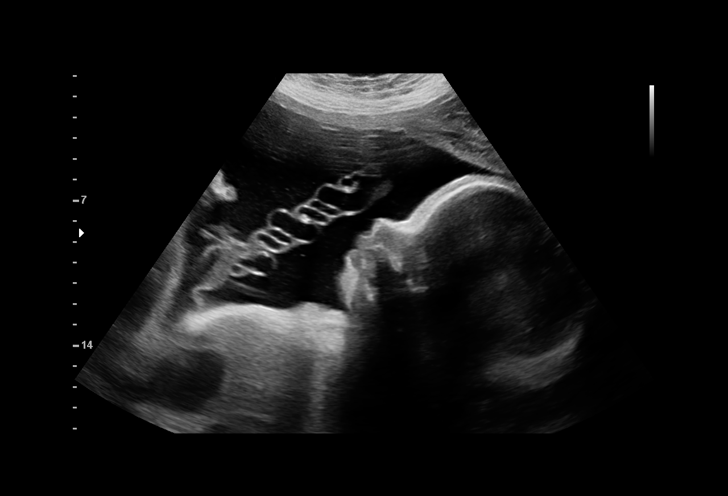
[im 2/19]
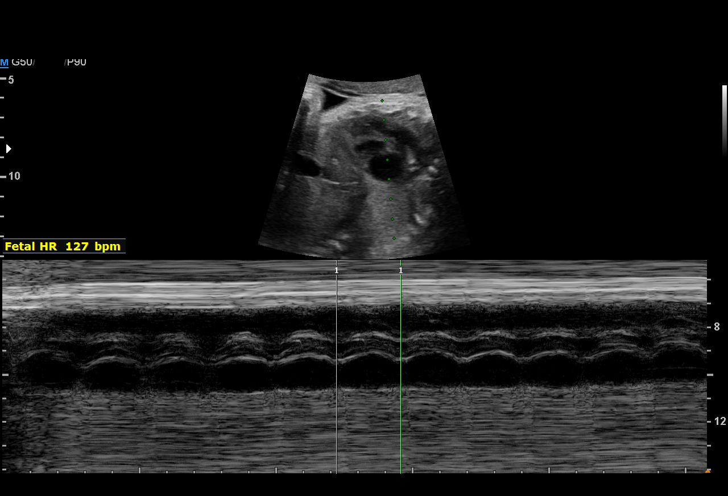
[im 4/19]
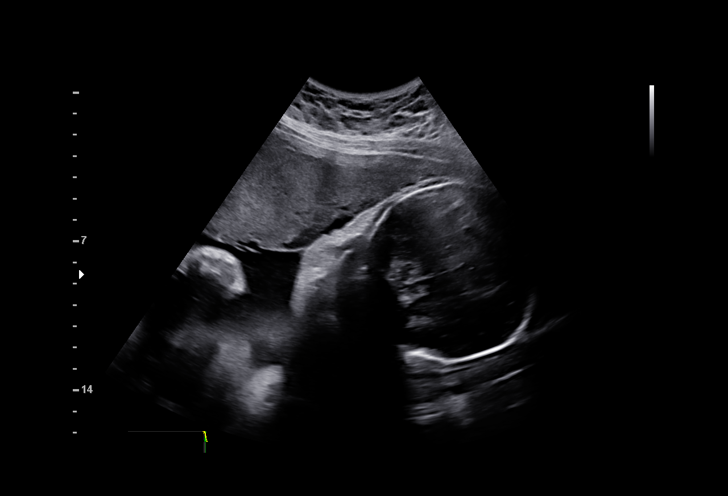
[im 5/19]
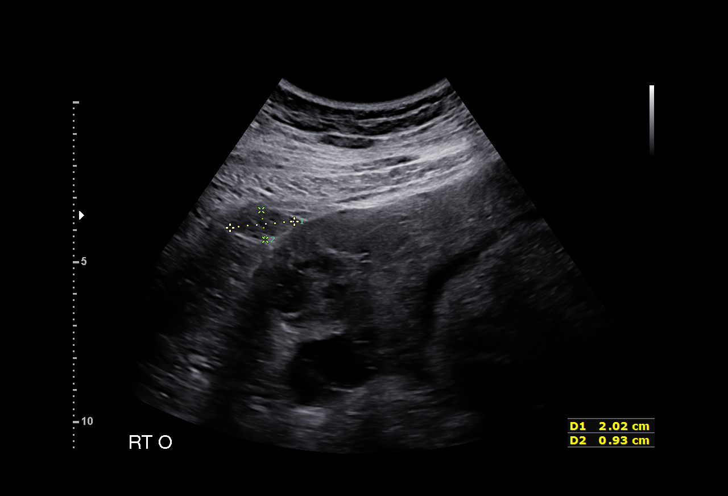
[im 6/19]
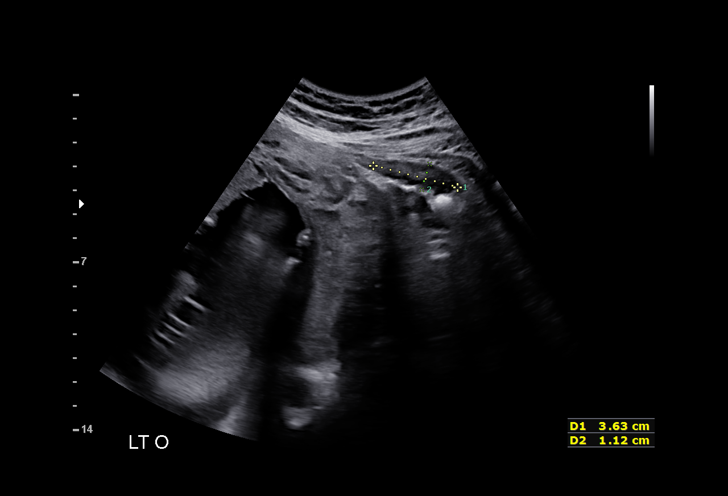
[im 7/19]
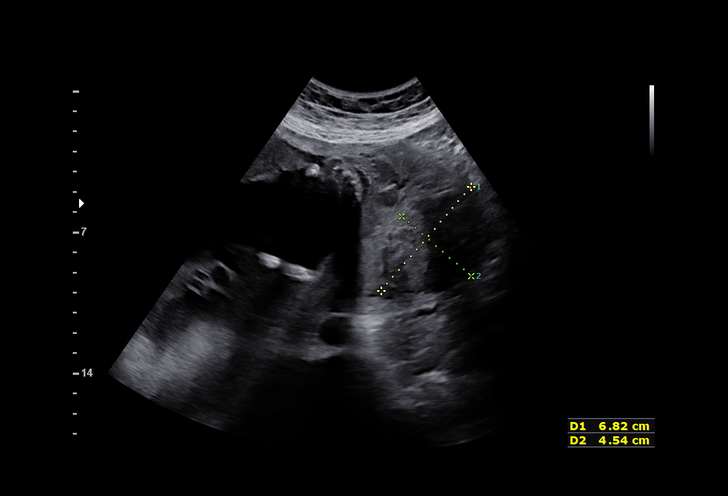
[im 9/19]
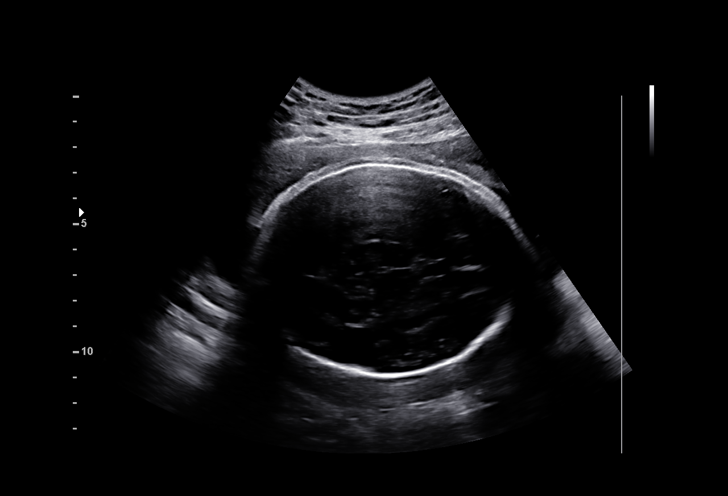
[im 10/19]
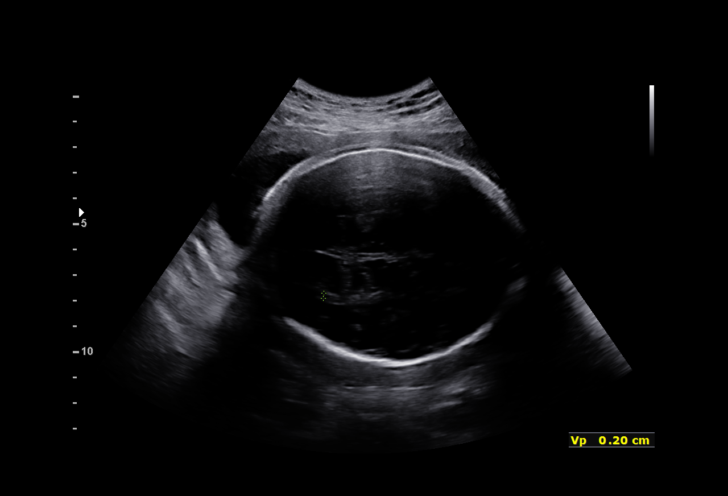
[im 11/19]
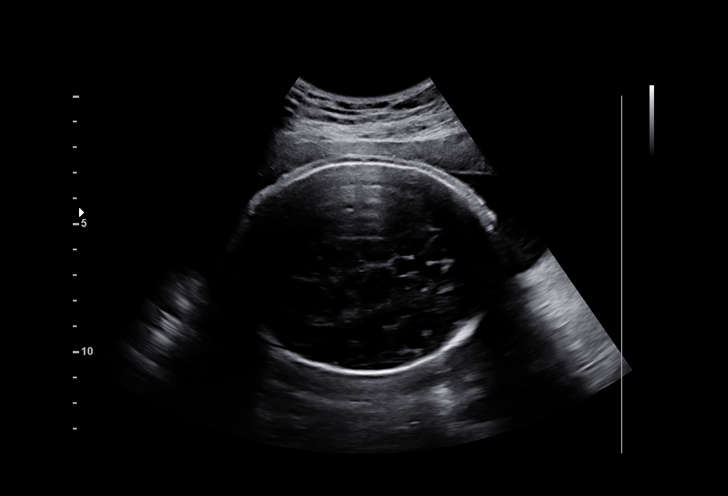
[im 13/19]
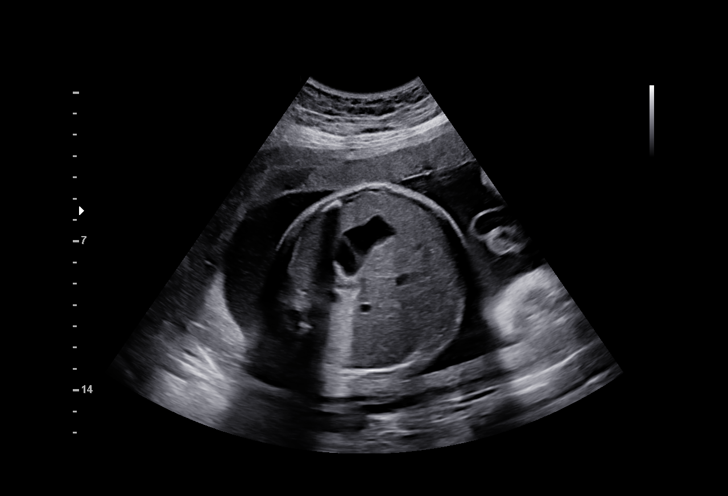
[im 14/19]
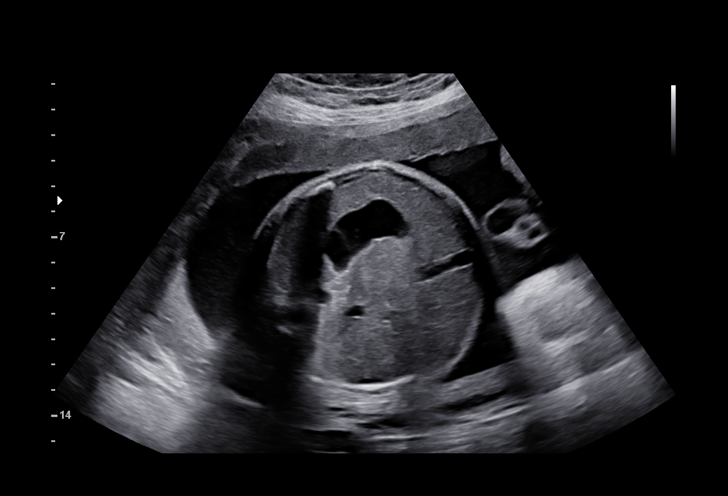
[im 15/19]
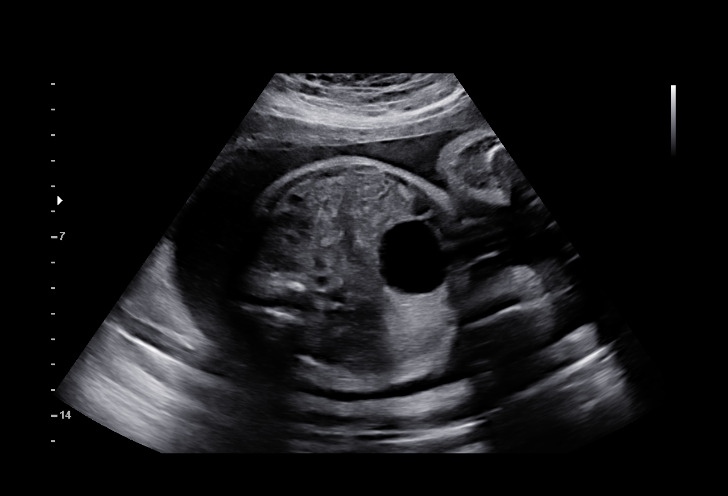
[im 16/19]
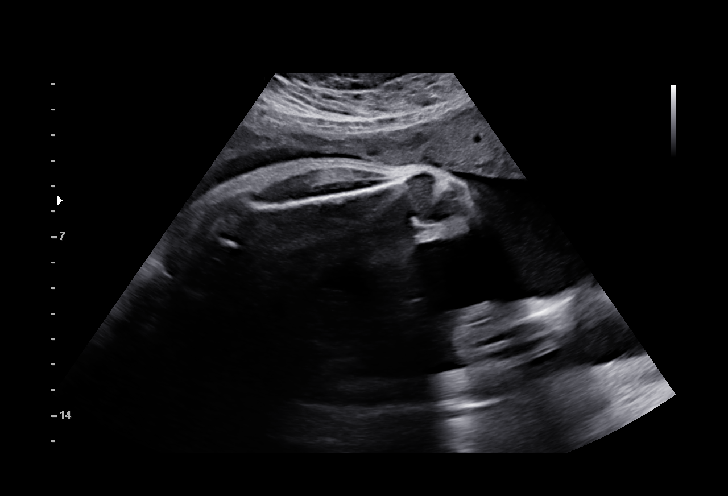
[im 18/19]
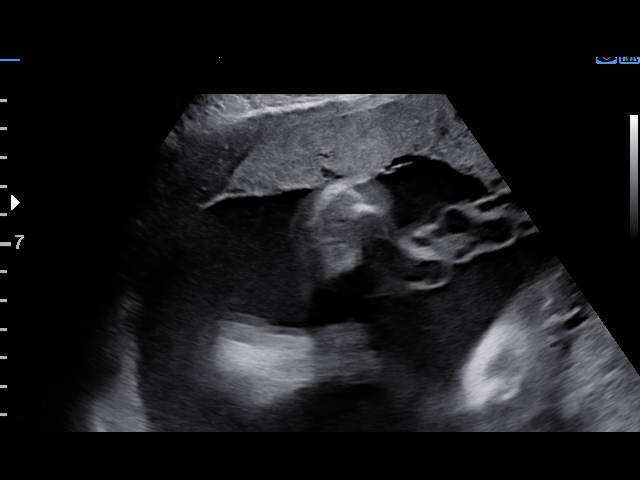
[im 19/19]
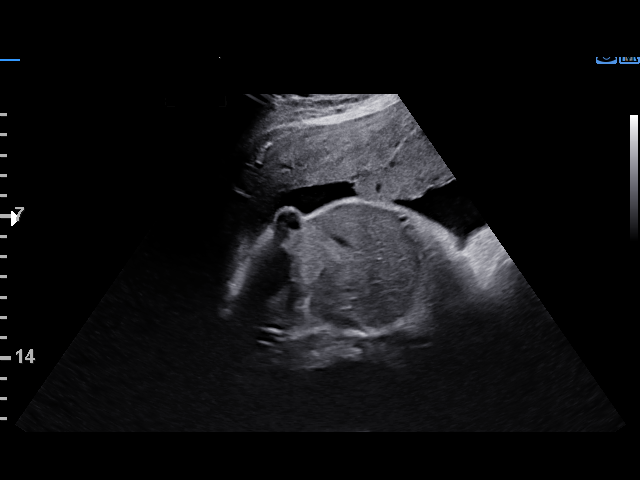

[15 of 19 positions shown; findings below may reference images not displayed]

Indications

 Polyhydramnios, third trimester, antepartum
 condition or complication, unspecified fetus
 Advanced maternal age multigravida 35+,
 third trimester
 Obesity complicating pregnancy, third
 trimester BMI 35
 31 weeks gestation of pregnancy
 Uterine fibroids affecting pregnancy in third  O34.13,
 trimester, antepartum
 Encounter for antenatal screening for
 malformations
 Genetic carrier (Yuneisy Sethi)
 Gestational diabetes in pregnancy,
 unspecified control
Fetal Evaluation

 Num Of Fetuses:         1
 Fetal Heart Rate(bpm):  127
 Cardiac Activity:       Observed
 Presentation:           Cephalic
 Placenta:               Anterior
 P. Cord Insertion:      Previously Visualized

 Amniotic Fluid
 AFI FV:      Polyhydramnios

 AFI Sum(cm)     %Tile       Largest Pocket(cm)
 25.69           96
 RUQ(cm)       RLQ(cm)       LUQ(cm)        LLQ(cm)

Biophysical Evaluation

 Amniotic F.V:   Within normal limits       F. Tone:        Observed
 F. Movement:    Observed                   Score:          [DATE]
 F. Breathing:   Observed
Biometry

 LV:          2  mm
OB History

 Gravidity:    3         Term:   2        Prem:   0        SAB:   0
 TOP:          0       Ectopic:  0        Living: 2
Gestational Age

 LMP:           29w 5d        Date:  01/13/21                 EDD:   10/20/21
 Best:          31w 5d     Det. By:  U/S  (05/23/21)          EDD:   10/06/21
Anatomy

 Ventricles:            Appears normal         Kidneys:                Appear normal
 Heart:                 Appears normal         Bladder:                Appears normal
                        (4CH, axis, and
                        situs)
 Stomach:               Appears normal, left
                        sided
Cervix Uterus Adnexa

 Cervix
 Not visualized (advanced GA >87wks)

 Right Ovary
 Visualized.

 Left Ovary
 Visualized.
Comments

 This patient was seen for a BPP due to polyhydramnios and
 gestational diabetes.
 A biophysical profile performed today was [DATE].
 Polyhydramnios with a total AFI of 25.7 cm continues to be
 noted today.
 She will return in 1 week for a growth scan and another BPP.

## 2021-08-09 NOTE — Patient Instructions (Signed)
Access Code: [email or web address] URL: [email or web address] Date: 08/09/2021 Prepared by: Venetia Night Duha Abair  Exercises Sidelying Transversus Abdominis Bracing - 1 x daily - 7 x weekly - 2 sets - 10 reps - 5 hold Quadruped Rocking Slow - 3 x daily - 7 x weekly - 2 sets - 10 reps Seated Long Arc Quad - 3 x daily - 7 x weekly - 1 sets - 10 reps   USE stomach muscles with transitions, consider adding pregnancy support belt

## 2021-08-09 NOTE — Therapy (Signed)
[facility] @ [address] [place], [place], [address] Phone: [phone]   Fax:  [phone]  Physical Therapy Evaluation  Patient Details  Name: Sandra Krueger MRN: [id number] Date of Birth: 04-Mar-1986 Referring Provider (PT): Patriciaann Clan, DO   Encounter Date: 08/09/2021   PT End of Session - 08/09/21 1132     Visit Number 1    Date for PT Re-Evaluation 10/04/21    Authorization Type [employer]    Authorization - Visit Number 1    Authorization - Number of Visits 8    PT Start Time [phone]    PT Stop Time 0930    PT Time Calculation (min) 43 min    Activity Tolerance Patient limited by pain;Patient limited by fatigue    Behavior During Therapy [place] for tasks assessed/performed             Past Medical History:  Diagnosis Date   Asthma    Fibroids    Headache    Panic attack    last episode approx two months ago per pt    Past Surgical History:  Procedure Laterality Date   OTHER SURGICAL HISTORY     fibroids removed   OVARY SURGERY     cyst removed from ? right ovary    There were no vitals filed for this visit.    Subjective Assessment - 08/09/21 0847     Subjective Pt is 31w 5 days pregnant with ongoing Lt sided leg pain.  Pt reports she has a fibroid on her spine and extra amniotic fluid making it worse..  She experienced similar symptoms with previous pregnancies but this is more intense.  "This is 10x worse.  I can't really do anything.  I was more mobile with previous pregnancies."  Pt has two other daughters ages 48 and 79.  Pt has pain and tingling across anterior thigh and knee and Lt leg keeps giving out.  I have fallen a few times with the leg giving out.    Pertinent History PMH: asthma, gestational diabetes, LGA fetus, Nov 2019 SVD, 2010 SVD    Limitations Walking;House hold activities;Lifting;Standing    How long can you sit comfortably? pain with rising from sitting    How  long can you stand comfortably? 10 min    How long can you walk comfortably? household distances    Currently in Pain? Yes    Pain Score 10-Worst pain ever    Pain Location Back    Pain Orientation Lower;Mid;Left    Pain Descriptors / Indicators Tingling    Pain Type Acute pain    Pain Radiating Towards Lt hip, buttock and anterior thigh    Pain Frequency Constant    Aggravating Factors  rolling in bed, standing and walking, rising from sitting    Pain Relieving Factors unsure    Effect of Pain on Daily Activities sleep, all activity                [facility] PT Assessment - 08/09/21 0001       Assessment   Medical Diagnosis M54.32 (ICD-10-CM) - Sciatica of left side    Referring Provider (PT) Patriciaann Clan, DO    Onset Date/Surgical Date --   first trimester   Next MD Visit today    Prior Therapy yes for Lt leg pain      Precautions   Precautions Fall      Balance Screen  Has the patient fallen in the past 6 months Yes    How many times? 6    Has the patient had a decrease in activity level because of a fear of falling?  Yes    Is the patient reluctant to leave their home because of a fear of falling?  Yes      [place] residence    Living Arrangements Parent;Children    Home Layout Two level      Prior Function   Level of Independence Independent    Vocation Requirements receptionist at hotel      Observation/Other Assessments   Observations Pt very cautious with all mobility      Posture/Postural Control   Posture/Postural Control Postural limitations    Postural Limitations Anterior pelvic tilt;Weight shift right;Increased lumbar lordosis;Forward head      ROM / Strength   AROM / PROM / Strength AROM;Strength      AROM   AROM Assessment Site Lumbar    Lumbar Flexion fingers to knees, pain anterior lower abdomen, relief lumbar pain    Lumbar Extension 5, pain    Lumbar - Right Side Bend WNL    Lumbar - Left Side  Bend WNL      Strength   Overall Strength Comments core 3+/5, Lt hip and knee 4-/5, Rt LE 4/5      Palpation   Spinal mobility good lordosis reversal with lumbar flexion    Palpation comment tender Lt SI, gluteals, piriformis, diffuse throughout lumbar spine      Bed Mobility   Bed Mobility Rolling Right;Rolling Left;Supine to Sit;Sit to Supine    Rolling Right Supervision/verbal cueing    Rolling Left Supervision/Verbal cueing    Supine to Sit Supervision/Verbal cueing    Sit to Supine Supervision/Verbal cueing      Transfers   Transfers Sit to Stand    Sit to Stand 6: Modified independent (Device/Increase time);With armrests   uses Rt>Lt LE     Ambulation/Gait   Ambulation/Gait Yes    Gait Pattern Lateral trunk lean to right;Lateral trunk lean to left;Wide base of support;Decreased hip/knee flexion - right;Decreased hip/knee flexion - left                        Objective measurements completed on examination: See above findings.       [facility] Adult PT Treatment/Exercise - 08/09/21 0001       Therapeutic Activites    Therapeutic Activities Other Therapeutic Activities    Other Therapeutic Activities bed mobility with TA practice      Exercises   Exercises Lumbar      Lumbar Exercises: Standing   Other Standing Lumbar Exercises rocking into each hip Rt/Lt with forearms propped on counter to unweight spine and stretch buttocks x 10 each way      Lumbar Exercises: Sidelying   Other Sidelying Lumbar Exercises TA indraw 10x5" holds                     PT Education - 08/09/21 0929     Education Details Access Code: [email or web address], pregnancy support belt, use TA with bed mobility and sit to stand transitions    Person(s) Educated Patient    Methods Explanation;Demonstration;Handout    Comprehension Verbalized understanding;Returned demonstration              PT Short Term Goals - 08/09/21 1242  PT SHORT TERM GOAL #1   Title Pt will  demo proper body mechanics with use of TA for chair transfers and bed mobility.    Baseline -    Status New    Target Date 08/30/21      PT SHORT TERM GOAL #2   Title Pt will demo at least 2 strategies for positioning for relief of LBP and Lt LE pain.    Baseline -    Time 3    Period Weeks    Status New    Target Date 08/30/21               PT Long Term Goals - 08/09/21 1246       PT LONG TERM GOAL #1   Title Pt will improve Lt LE strength to allow for sit to stand with nearly symmetrical WB Rt=Lt and stair performance at home using step to and rail.    Baseline -    Time 8    Period Weeks    Status New    Target Date 10/04/21      PT LONG TERM GOAL #2   Title Pt will demo proper mechanics for functional squat, lift and carry to prepare for infant care upon delivery.    Baseline -    Time 8    Period Weeks    Status New    Target Date 10/04/21      PT LONG TERM GOAL #3   Title Pt will be able to ambulate with reduced lateral trunk lean bil secondary to improved closed chain hip and trunk strength.    Baseline -    Time 8    Period Weeks    Status New    Target Date 10/04/21      PT LONG TERM GOAL #4   Title Pt will report no falls secondary to improved Lt LE and core strength.    Baseline -    Time 8    Status New    Target Date 10/04/21      PT LONG TERM GOAL #5   Title pt will demo strong contraction of TA and lumbar multifidi to stabilize pelvis during daily activiities     Baseline poor contractions and difficulty engaging these muscles    Time 8    Period Weeks    Status New    Target Date 10/04/21                    Plan - 08/09/21 1133     Clinical Impression Statement Pt is a 79yo mother of 2 who is pregnant with her third child.  Pt is [redacted]w[redacted]d pregnant at evaluation today.  Medical history includes gestaitional diabetes, LGA fetus, asthma and obesity during pregnancy.  She also has a fibroid near her spine on Lt side and is told she  has extra amniotic fluid.  Pt reports ongoing Lt sided back, hip and leg pain with associated weakness and tingling with history of at least 6 falls due to Lt leg giving way.  Onset of pain and weakness was within first trimester.  Pt is limited to household distances for ambulation and uses family members to support her.  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  Pt can stand approx 10 min and avoids full WB into Lt LE.  Pt presents with abnormal gait, slow transitions, difficulty and pain with chair transfers and bed mobility, Lt>Rt LE weakness  of hip and thigh, and core and trunk weakness.  Pt has tried using pregnancy brace from previous pregnancy but didn't feel it was helping.  PT educated Pt on positioning and gentle stretching using table stretch/rocking which gave Pt some relief today.  Intro of TA in sidelying and education on trying to use abdominal indraw with all transfers.  PT proposed taping for abdominal support but Pt was having abdominal US later today.  Pt will benefit from skilled PT to work on strength, gait, balance, mobility and pain reduction to optimize safety, function and accommodation of late-stage pregnancy.    Personal Factors and Comorbidities Time since onset of injury/illness/exacerbation;Comorbidity 1;Comorbidity 2;Comorbidity 3+    Comorbidities asthma, gestational diabetes, obesity    Examination-Activity Limitations Bend;Sit;Stairs;Stand;Lift;Caring for Others;Bed Mobility;Locomotion Level;Transfers;Sleep;Squat    Examination-Participation Restrictions Community Activity;Occupation;Cleaning;Shop;Laundry    Stability/Clinical Decision Making Evolving/Moderate complexity    Clinical Decision Making Moderate    Rehab Potential Fair    PT Frequency 1x / week    PT Duration 8 weeks    PT Treatment/Interventions ADLs/Self Care Home Management;Cryotherapy;Moist Heat;Gait training;Stair training;Functional mobility training;Therapeutic  activities;Therapeutic exercise;Balance training;Neuromuscular re-education;Manual techniques;Patient/family education;Taping;Passive range of motion;Energy conservation    PT Next Visit Plan [place]aday or STM to Lt hip, work on transfers and seated strength of Lt LE, core, positioning for unweighting spine, standing weight shifts for closed chain hip strength    PT Home Exercise Plan Access Code: [email or web address]    Consulted and Agree with Plan of Care Patient             Patient will benefit from skilled therapeutic intervention in order to improve the following deficits and impairments:  Abnormal gait, Obesity, Pain, Postural dysfunction, Decreased strength, Decreased mobility, Decreased balance, Decreased activity tolerance, Difficulty walking, Improper body mechanics  Visit Diagnosis: Acute left-sided low back pain with left-sided sciatica - Plan: PT plan of care cert/re-cert  Muscle weakness (generalized) - Plan: PT plan of care cert/re-cert  Abnormal posture - Plan: PT plan of care cert/re-cert     Problem List Patient Active Problem List   Diagnosis Date Noted   Gestational diabetes 08/03/2021   Polyhydramnios affecting pregnancy 08/01/2021   Abnormal glucose tolerance in pregnancy 08/01/2021   LGA (large for gestational age) fetus affecting management of mother 05/23/2021   Alpha thalassemia silent carrier 04/18/2021   Low TSH level 03/30/2021   COVID-19 affecting pregnancy in first trimester 03/24/2021   Supervision of high risk pregnancy, antepartum 03/17/2021   AMA (advanced maternal age) multigravida 35+ 03/17/2021   History of COVID-19 03/17/2021   Asthma affecting pregnancy in first trimester    Obesity in pregnancy    BMI 40.0-44.9, adult ([place]) 11/16/2017   Uterine fibroid in pregnancy 11/16/2017    Baruch Merl, PT 08/09/21 12:53 PM   [facility] @ [address] [place], [place], [address] Phone:  [phone]   Fax:  [phone]  Name: Sandra Krueger MRN: [id number] Date of Birth: 1986-04-22

## 2021-08-16 ENCOUNTER — Other Ambulatory Visit: Payer: Self-pay | Admitting: *Deleted

## 2021-08-16 ENCOUNTER — Ambulatory Visit: Payer: Medicaid Other | Admitting: *Deleted

## 2021-08-16 ENCOUNTER — Ambulatory Visit: Payer: Medicaid Other | Attending: Obstetrics

## 2021-08-16 ENCOUNTER — Other Ambulatory Visit: Payer: Self-pay

## 2021-08-16 ENCOUNTER — Other Ambulatory Visit: Payer: Medicaid Other

## 2021-08-16 VITALS — BP 97/65 | HR 101

## 2021-08-16 DIAGNOSIS — Z8616 Personal history of COVID-19: Secondary | ICD-10-CM | POA: Diagnosis present

## 2021-08-16 DIAGNOSIS — O09523 Supervision of elderly multigravida, third trimester: Secondary | ICD-10-CM

## 2021-08-16 DIAGNOSIS — J45909 Unspecified asthma, uncomplicated: Secondary | ICD-10-CM | POA: Insufficient documentation

## 2021-08-16 DIAGNOSIS — O24410 Gestational diabetes mellitus in pregnancy, diet controlled: Secondary | ICD-10-CM | POA: Diagnosis present

## 2021-08-16 DIAGNOSIS — O403XX0 Polyhydramnios, third trimester, not applicable or unspecified: Secondary | ICD-10-CM

## 2021-08-16 DIAGNOSIS — D259 Leiomyoma of uterus, unspecified: Secondary | ICD-10-CM

## 2021-08-16 DIAGNOSIS — O99213 Obesity complicating pregnancy, third trimester: Secondary | ICD-10-CM | POA: Diagnosis present

## 2021-08-16 DIAGNOSIS — O0990 Supervision of high risk pregnancy, unspecified, unspecified trimester: Secondary | ICD-10-CM | POA: Diagnosis present

## 2021-08-16 DIAGNOSIS — O99210 Obesity complicating pregnancy, unspecified trimester: Secondary | ICD-10-CM | POA: Insufficient documentation

## 2021-08-16 DIAGNOSIS — O24913 Unspecified diabetes mellitus in pregnancy, third trimester: Secondary | ICD-10-CM

## 2021-08-16 DIAGNOSIS — E669 Obesity, unspecified: Secondary | ICD-10-CM

## 2021-08-16 DIAGNOSIS — O99511 Diseases of the respiratory system complicating pregnancy, first trimester: Secondary | ICD-10-CM | POA: Insufficient documentation

## 2021-08-16 DIAGNOSIS — Z3A32 32 weeks gestation of pregnancy: Secondary | ICD-10-CM

## 2021-08-16 DIAGNOSIS — D563 Thalassemia minor: Secondary | ICD-10-CM

## 2021-08-16 IMAGING — US US MFM FETAL BPP W/O NON-STRESS
1 series · 14 of 28 positions shown · non-contrast
Comparison: none

[Series 1: us mfm fetal bpp w/o non-stress · 50 acquisitions, 14 frames shown]
[im 2/50]
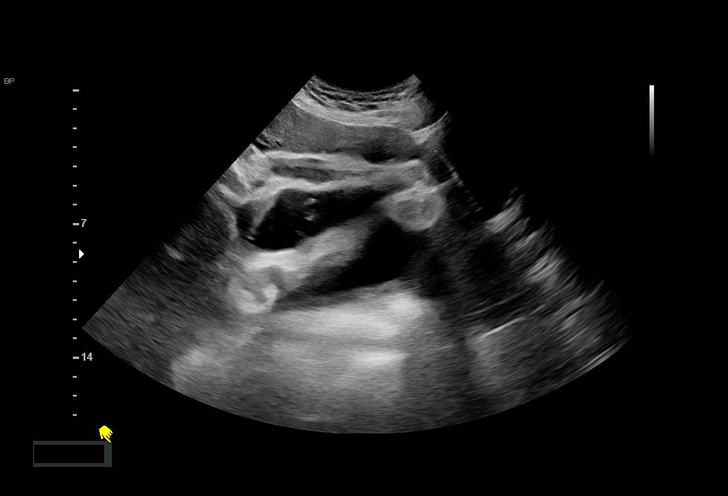
[im 6/50]
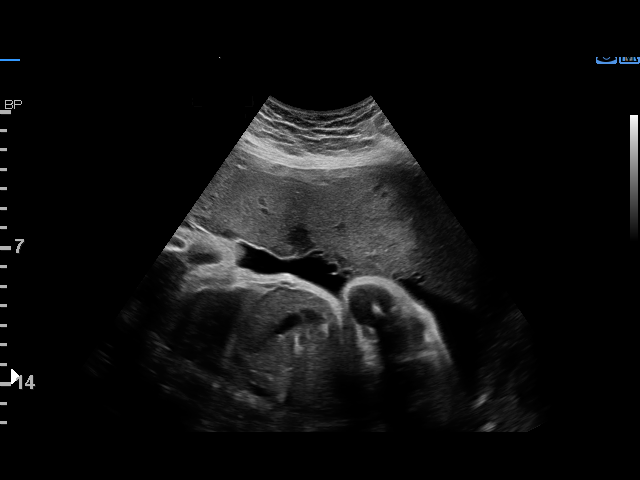
[im 10/50]
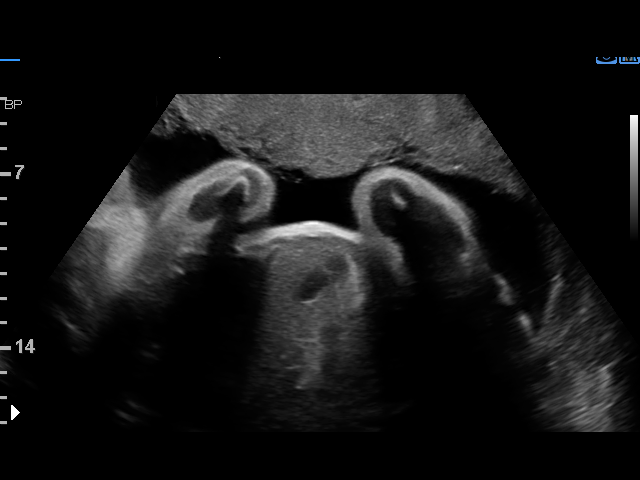
[im 13/50]
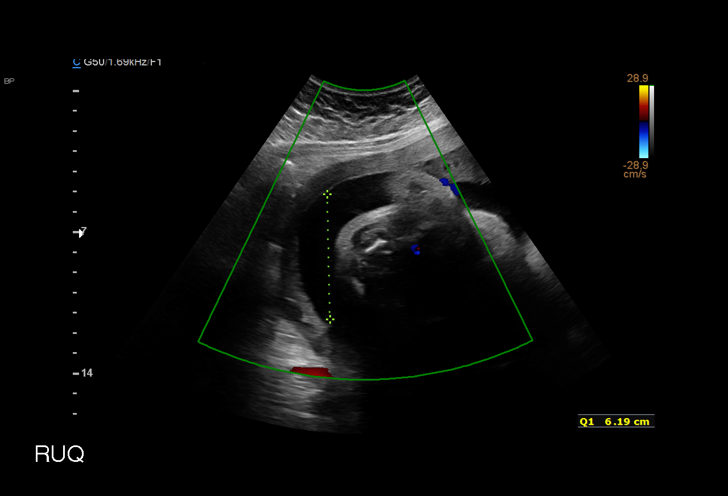
[im 17/50]
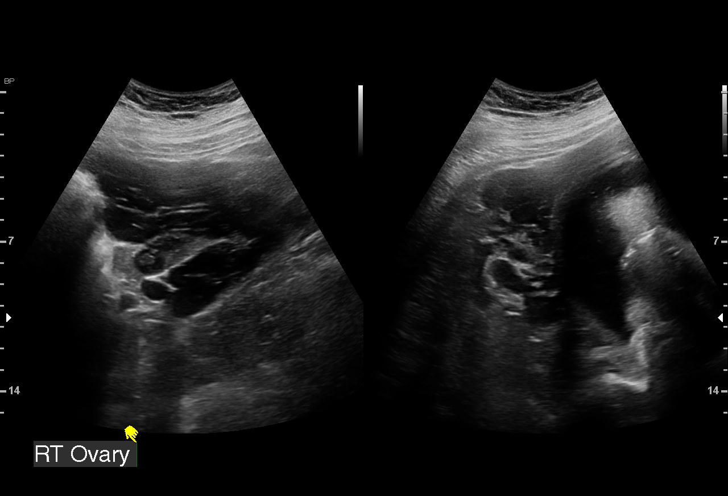
[im 20/50]
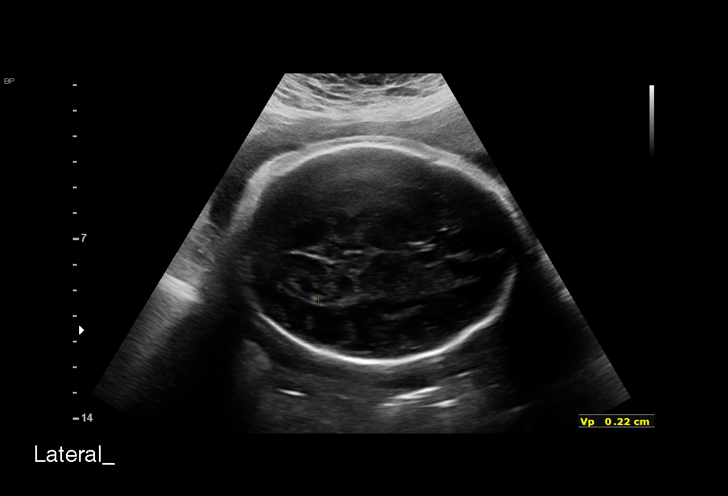
[im 24/50]
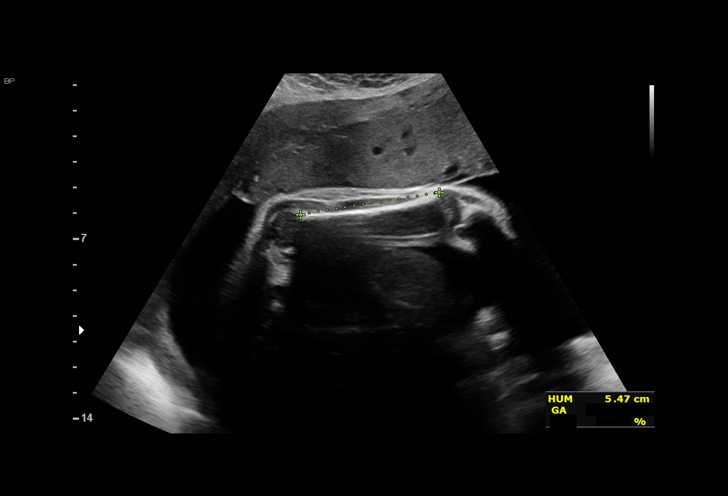
[im 28/50]
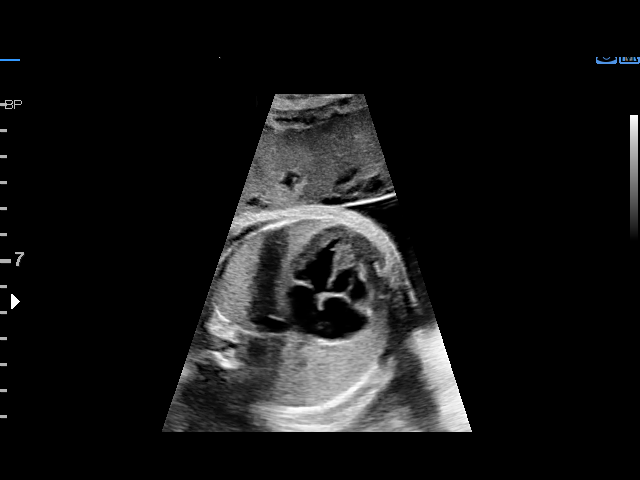
[im 31/50]
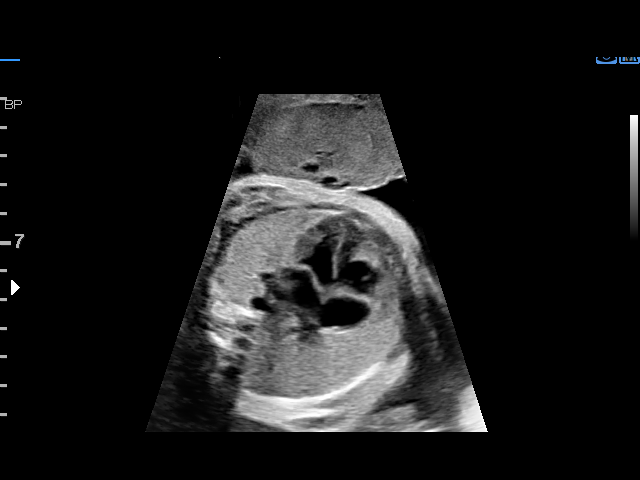
[im 35/50]
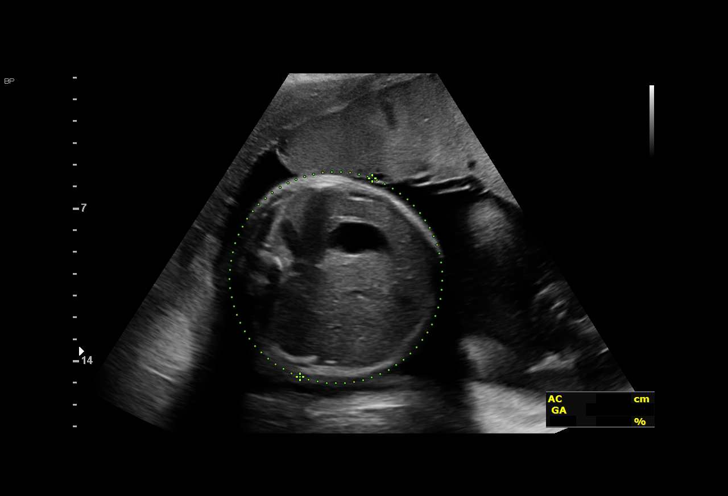
[im 39/50]
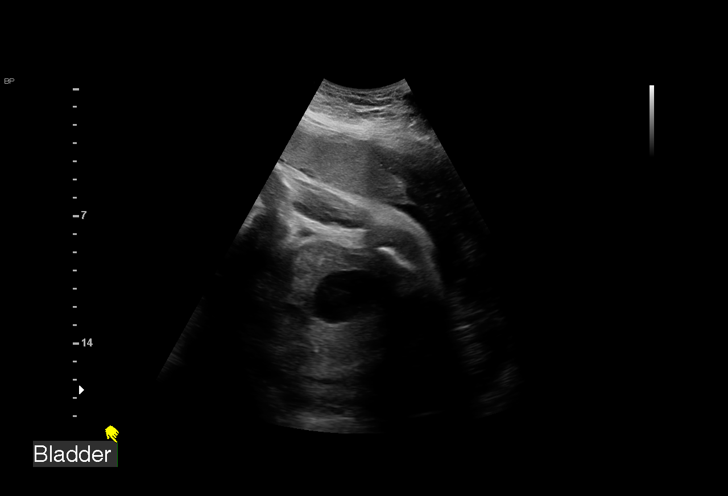
[im 42/50]
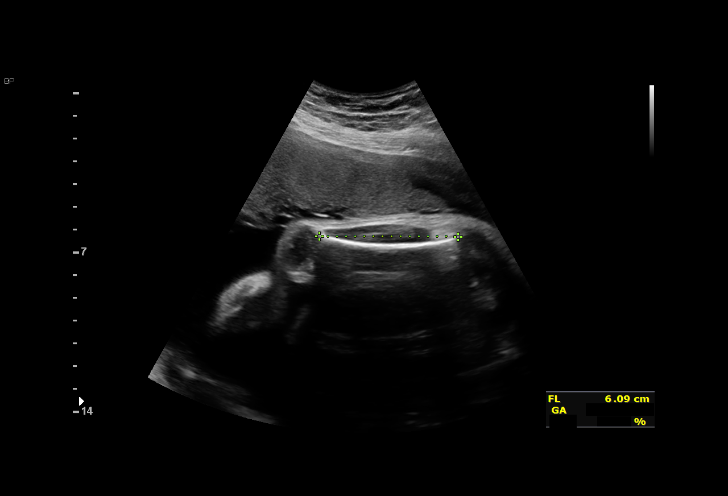
[im 46/50]
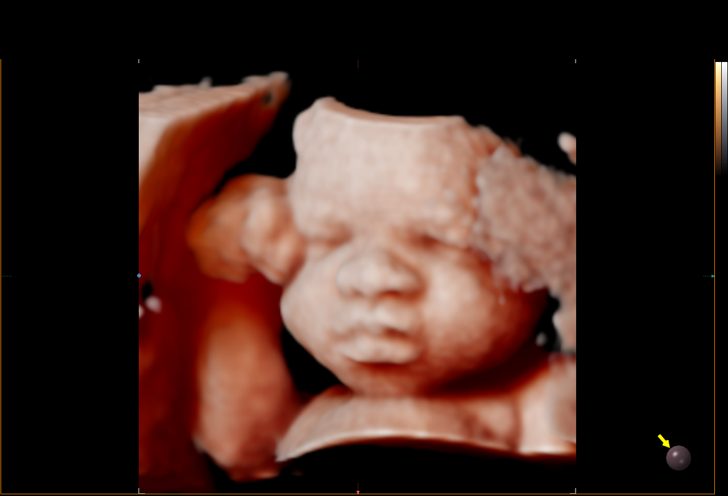
[im 50/50]
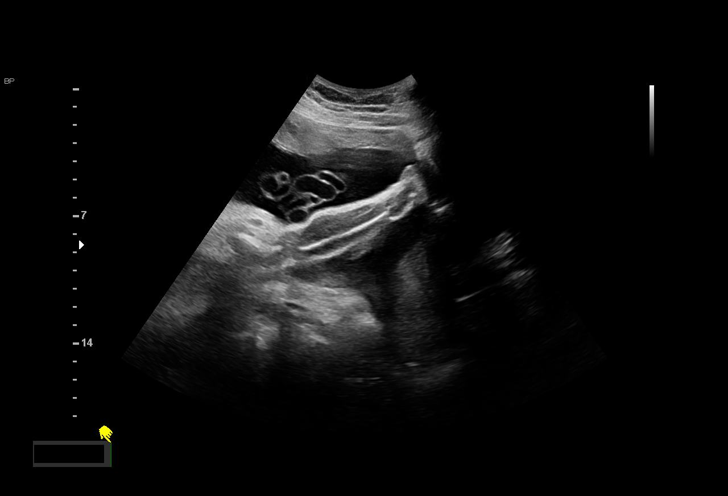

[14 of 28 positions shown; findings below may reference images not displayed]

Indications

 Polyhydramnios, third trimester, antepartum
 condition or complication, unspecified fetus
 Obesity complicating pregnancy, third
 trimester BMI 45
 Gestational diabetes in pregnancy,
 unspecified control
 Advanced maternal age multigravida 35+,
 third trimester
 Uterine fibroids affecting pregnancy in third  O34.13,
 trimester, antepartum
 Genetic carrier (Siti Aishah Morata)
 32 weeks gestation of pregnancy
Fetal Evaluation

 Num Of Fetuses:         1
 Fetal Heart Rate(bpm):  138
 Cardiac Activity:       Observed
 Presentation:           Breech, footling
 Placenta:               Anterior
 P. Cord Insertion:      Previously Visualized

 Amniotic Fluid
 AFI FV:      Within normal limits

 AFI Sum(cm)     %Tile       Largest Pocket(cm)
 27.48           > 97
 RUQ(cm)       RLQ(cm)       LUQ(cm)        LLQ(cm)

Biophysical Evaluation

 Amniotic F.V:   Polyhydramnios             F. Tone:        Observed
 F. Movement:    Observed                   Score:          [DATE]
 F. Breathing:   Observed
Biometry

 BPD:      84.4  mm     G. Age:  34w 0d         79  %    CI:        71.67   %    70 - 86
                                                         FL/HC:      19.2   %    19.9 -
 HC:      317.4  mm     G. Age:  35w 5d         87  %    HC/AC:      1.03        0.96 -
 AC:      307.3  mm     G. Age:  34w 5d         94  %    FL/BPD:     72.3   %    71 - 87
 FL:         61  mm     G. Age:  31w 5d         14  %    FL/AC:      19.9   %    20 - 24
 HUM:      55.3  mm     G. Age:  32w 1d         45  %
 LV:        2.2  mm

 Est. FW:    2044  gm      5 lb 1 oz     77  %
OB History

 Gravidity:    3         Term:   2        Prem:   0        SAB:   0
 TOP:          0       Ectopic:  0        Living: 2
Gestational Age

 LMP:           30w 5d        Date:  01/13/21                 EDD:   10/20/21
 U/S Today:     34w 0d                                        EDD:   09/27/21
 Best:          32w 5d     Det. By:  U/S  (05/23/21)          EDD:   10/06/21
Anatomy

 Cranium:               Appears normal         Aortic Arch:            Previously seen
 Cavum:                 Appears normal         Ductal Arch:            Previously seen
 Ventricles:            Appears normal         Diaphragm:              Appears normal
 Choroid Plexus:        Previously seen        Stomach:                Appears normal, left
                                                                       sided
 Cerebellum:            Previously seen        Abdomen:                Appears normal
 Posterior Fossa:       Previously seen        Abdominal Wall:         Previously seen
 Nuchal Fold:           Previously seen        Cord Vessels:           Previously seen
 Face:                  Orbits and profile     Kidneys:                Appear normal
                        previously seen
 Lips:                  Previously seen        Bladder:                Appears normal
 Thoracic:              Previously seen        Spine:                  Previously seen
 Heart:                 Appears normal         Upper Extremities:      Previously seen
                        (4CH, axis, and
                        situs)
 RVOT:                  Previously seen        Lower Extremities:      Previously seen
 LVOT:                  Previously seen

 Other:  3VT prev visualized.
Cervix Uterus Adnexa
 Cervix
 Not visualized (advanced GA >20wks)

 Uterus
 Single fibroid noted, see table below.

 Right Ovary
 Within normal limits.

 Left Ovary
 Not visualized.

 Cul De Sac
 No free fluid seen.

 Adnexa
 No adnexal mass visualized.
Myomas

 Site                     L(cm)      W(cm)      D(cm)       Location
 Posterior left

 Blood Flow                  RI       PI       Comments

Comments

 This patient was seen for a BPP and growth scan due to
 polyhydramnios and gestational diabetes.  She denies any
 problems since her last exam.
 The fetal growth appears appropriate for her gestational age.
 Polyhydramnios with a total AFI of 27.5 cm continues to be
 noted today.
 A biophysical profile performed today was [DATE].
 She will return in 1 week for another BPP.

## 2021-08-18 ENCOUNTER — Other Ambulatory Visit: Payer: Self-pay

## 2021-08-18 ENCOUNTER — Ambulatory Visit: Payer: Medicaid Other | Attending: Family Medicine | Admitting: Physical Therapy

## 2021-08-18 ENCOUNTER — Encounter: Payer: Self-pay | Admitting: Physical Therapy

## 2021-08-18 ENCOUNTER — Encounter: Payer: Self-pay | Admitting: Obstetrics and Gynecology

## 2021-08-18 ENCOUNTER — Ambulatory Visit (INDEPENDENT_AMBULATORY_CARE_PROVIDER_SITE_OTHER): Payer: Medicaid Other | Admitting: Obstetrics and Gynecology

## 2021-08-18 VITALS — BP 115/68 | HR 103 | Wt 248.1 lb

## 2021-08-18 DIAGNOSIS — O3663X0 Maternal care for excessive fetal growth, third trimester, not applicable or unspecified: Secondary | ICD-10-CM

## 2021-08-18 DIAGNOSIS — O0990 Supervision of high risk pregnancy, unspecified, unspecified trimester: Secondary | ICD-10-CM

## 2021-08-18 DIAGNOSIS — M6281 Muscle weakness (generalized): Secondary | ICD-10-CM | POA: Diagnosis present

## 2021-08-18 DIAGNOSIS — O24410 Gestational diabetes mellitus in pregnancy, diet controlled: Secondary | ICD-10-CM

## 2021-08-18 DIAGNOSIS — R293 Abnormal posture: Secondary | ICD-10-CM | POA: Insufficient documentation

## 2021-08-18 DIAGNOSIS — D563 Thalassemia minor: Secondary | ICD-10-CM

## 2021-08-18 DIAGNOSIS — O3410 Maternal care for benign tumor of corpus uteri, unspecified trimester: Secondary | ICD-10-CM

## 2021-08-18 DIAGNOSIS — M5442 Lumbago with sciatica, left side: Secondary | ICD-10-CM | POA: Insufficient documentation

## 2021-08-18 DIAGNOSIS — O409XX0 Polyhydramnios, unspecified trimester, not applicable or unspecified: Secondary | ICD-10-CM

## 2021-08-18 DIAGNOSIS — R7989 Other specified abnormal findings of blood chemistry: Secondary | ICD-10-CM

## 2021-08-18 DIAGNOSIS — D259 Leiomyoma of uterus, unspecified: Secondary | ICD-10-CM

## 2021-08-18 DIAGNOSIS — O09523 Supervision of elderly multigravida, third trimester: Secondary | ICD-10-CM

## 2021-08-18 NOTE — Therapy (Signed)
[facility] @ [address] [place], [place], [address] Phone: [phone]   Fax:  [phone]  Physical Therapy Treatment  Patient Details  Name: Sandra Krueger MRN: [id number] Date of Birth: [birth year] Referring Provider (PT): Patriciaann Clan, DO   Encounter Date: 08/18/2021   PT End of Session - 08/18/21 1449     Visit Number 2    Date for PT Re-Evaluation 10/04/21    Authorization Type [employer]    Authorization Time Period through 09/13/21    Authorization - Visit Number 2    Authorization - Number of Visits 8    PT Start Time 3568    PT Stop Time 1525    PT Time Calculation (min) 38 min    Activity Tolerance Patient limited by pain;Patient limited by fatigue    Behavior During Therapy [facility] for tasks assessed/performed             Past Medical History:  Diagnosis Date   Asthma    COVID-19 affecting pregnancy in first trimester 03/24/2021   Late July 2022, (see care everywhere for test)   Fibroids    Headache    Panic attack    last episode approx two months ago per pt    Past Surgical History:  Procedure Laterality Date   OTHER SURGICAL HISTORY     fibroids removed   OVARY SURGERY     cyst removed from ? right ovary    There were no vitals filed for this visit.   Subjective Assessment - 08/18/21 1450     Subjective Pt is [redacted] weeks pregnant today.  I am feeling much better with the exercises.    Pertinent History PMH: asthma, gestational diabetes, LGA fetus, Nov 2019 SVD, 2010 SVD    Limitations Walking;House hold activities;Lifting;Standing    How long can you sit comfortably? pain with rising from sitting    How long can you stand comfortably? 10 min    How long can you walk comfortably? household distances    Currently in Pain? Yes    Pain Score 3     Pain Location Back    Pain Radiating Towards legs are aching    Aggravating Factors  night positioning                                [facility] Adult PT Treatment/Exercise - 08/18/21 0001       Exercises   Exercises Lumbar;Knee/Hip;Shoulder      Lumbar Exercises: Standing   Other Standing Lumbar Exercises blue physioball on low mat table, draping upper trunk and hugging ball rocking side to side x 1' to unweight spine and weight shift      Lumbar Exercises: Seated   Long Arc Quad on Chair AROM;1 set;10 reps      Knee/Hip Exercises: Seated   Ball Squeeze 10x5 sec    Clamshell with Marga Hoots   with glut squeeze 10x5 sec   Sit to Sand 5 reps;with UE support   hands on thighs from elevated mat table     Shoulder Exercises: Standing   Row Strengthening;Theraband;10 reps    Theraband Level (Shoulder Row) Level 3 (Green)      Manual Therapy   Manual Therapy Soft tissue mobilization    Manual therapy comments Pt does better laying on Lt side first (minimize time on Lt side) then laying on Rt side  Soft tissue mobilization Addaday assisted STM bil hips, lateral and posterior thighs                       PT Short Term Goals - 08/18/21 1539       PT SHORT TERM GOAL #1   Title Pt will demo proper body mechanics with use of TA for chair transfers and bed mobility.    Baseline met for chair transfer, working on bed mobility    Status On-going      PT SHORT TERM GOAL #2   Title Pt will demo at least 2 strategies for positioning for relief of LBP and Lt LE pain.    Baseline standing with UE support on table and quadruped rocking    Status Achieved               PT Long Term Goals - 08/18/21 1540       PT LONG TERM GOAL #1   Title Pt will improve Lt LE strength to allow for sit to stand with nearly symmetrical WB Rt=Lt and stair performance at home using step to and rail.    Baseline met for sit to stand    Status On-going      PT LONG TERM GOAL #5   Title pt will demo strong contraction of TA and lumbar multifidi to stabilize pelvis during daily  activiities     Status On-going                   Plan - 08/18/21 1533     Clinical Impression Statement Pt arrives with much less pain having been compliant with HEP and using a new massage gun she got last week.  She is ambulating with less antalgia and equal WB bil, demo'ing ability to shift and bear weight into Lt side.  She is able to sit squarely in chair and rise from chair using Rt=Lt LEs.  Bed mobility is less painful.  She takes breaks at work to do standing table version of quadruped rocking which helps tolerate sitting at work for full shifts.  PT introduced seated pelvic/hip isometrics for hip IR/ER and glut squeeze along with sit to stand from elevated surface which was well tolerated.  Pt noted Lt LE fatigue with isometrics.  Addaday assisted STM used on bil hips today in SL and Pt noted in future she should start on Lt side and finish on Rt side due to pain with Lt SL.  Continue along POC.    Comorbidities asthma, gestational diabetes, obesity    Rehab Potential Fair    PT Frequency 1x / week    PT Duration 8 weeks    PT Treatment/Interventions ADLs/Self Care Home Management;Cryotherapy;Moist Heat;Gait training;Stair training;Functional mobility training;Therapeutic activities;Therapeutic exercise;Balance training;Neuromuscular re-education;Manual techniques;Patient/family education;Taping;Passive range of motion;Energy conservation    PT Next Visit Plan review HEP, progress postural support ther ex as tol, intro standing weight shifts for closed chain glut activation    PT Home Exercise Plan Access Code: [email or web address]    Consulted and Agree with Plan of Care Patient             Patient will benefit from skilled therapeutic intervention in order to improve the following deficits and impairments:     Visit Diagnosis: Acute left-sided low back pain with left-sided sciatica  Muscle weakness (generalized)  Abnormal posture     Problem List Patient Active Problem  List   Diagnosis Date Noted   Gestational  diabetes 08/03/2021   Polyhydramnios affecting pregnancy 08/01/2021   LGA (large for gestational age) fetus affecting management of mother 05/23/2021   Alpha thalassemia silent carrier 04/18/2021   Low TSH level 03/30/2021   Supervision of high risk pregnancy, antepartum 03/17/2021   AMA (advanced maternal age) multigravida 35+ 03/17/2021   History of COVID-19 03/17/2021   Asthma affecting pregnancy in first trimester    Obesity in pregnancy    BMI 40.0-44.9, adult ([place]) 11/16/2017   Uterine fibroid in pregnancy 11/16/2017    Baruch Merl, PT 08/18/21 3:41 PM   [facility] @ [address] [place], [place], [address] Phone: [phone]   Fax:  [phone]  Name: Sandra Krueger MRN: [id number] Date of Birth: 1985/12/14

## 2021-08-18 NOTE — Progress Notes (Signed)
Subjective:  Sandra Krueger is a 36 y.o. G3P2002 at [redacted]w[redacted]d being seen today for ongoing prenatal care.  She is currently monitored for the following issues for this high-risk pregnancy and has BMI 40.0-44.9, adult ([place]); Uterine fibroid in pregnancy; Supervision of high risk pregnancy, antepartum; AMA (advanced maternal age) multigravida 66+; History of COVID-19; Asthma affecting pregnancy in first trimester; Obesity in pregnancy; Low TSH level; Alpha thalassemia silent carrier; LGA (large for gestational age) fetus affecting management of mother; Polyhydramnios affecting pregnancy; and Gestational diabetes on their problem list.  Patient reports general discomforts of pregnancy.  Contractions: Not present. Vag. Bleeding: None.  Movement: Present. Denies leaking of fluid.   The following portions of the patient's history were reviewed and updated as appropriate: allergies, current medications, past family history, past medical history, past social history, past surgical history and problem list. Problem list updated.  Objective:   Vitals:   08/18/21 1048  BP: 115/68  Pulse: (!) 103  Weight: 248 lb 1.6 oz (112.5 kg)    Fetal Status: Fetal Heart Rate (bpm): 137   Movement: Present     General:  Alert, oriented and cooperative. Patient is in no acute distress.  Skin: Skin is warm and dry. No rash noted.   Cardiovascular: Normal heart rate noted  Respiratory: Normal respiratory effort, no problems with respiration noted  Abdomen: Soft, gravid, appropriate for gestational age. Pain/Pressure: Present     Pelvic:  Cervical exam deferred        Extremities: Normal range of motion.  Edema: Trace  Mental Status: Normal mood and affect. Normal behavior. Normal judgment and thought content.   Urinalysis:      Assessment and Plan:  Pregnancy: G3P2002 at [redacted]w[redacted]d  1. Supervision of high risk pregnancy, antepartum Stable  2. Diet controlled gestational diabetes mellitus (GDM) in third trimester Pt  reports CBG's in goal range Continue with serial growth scans and antenatal testing as per protocol  3. Polyhydramnios affecting pregnancy See above  4. Excessive fetal growth affecting management of pregnancy in third trimester, single or unspecified fetus See above  5. Multigravida of advanced maternal age in third trimester Stable  6. Uterine fibroid in pregnancy Stable  7. Low TSH level  - Thyroid Panel With TSH  8. Alpha thalassemia silent carrier Stable  Preterm labor symptoms and general obstetric precautions including but not limited to vaginal bleeding, contractions, leaking of fluid and fetal movement were reviewed in detail with the patient. Please refer to After Visit Summary for other counseling recommendations.  Return in about 2 weeks (around 09/01/2021) for OB visit, face to face, MD only.  Pt considering IUD vs BTL for contraception.  BTL papers signed today Chancy Milroy, MD

## 2021-08-18 NOTE — Patient Instructions (Signed)
Access Code: [email or web address] URL: [email or web address] Date: 08/18/2021 Prepared by: Venetia Night Gaytha Raybourn  Exercises Sidelying Transversus Abdominis Bracing - 1 x daily - 7 x weekly - 2 sets - 10 reps - 5 hold Quadruped Rocking Slow - 3 x daily - 7 x weekly - 2 sets - 10 reps Seated Long Arc Quad - 3 x daily - 7 x weekly - 1 sets - 10 reps Standing Bilateral Low Shoulder Row with Anchored Resistance - 1 x daily - 7 x weekly - 1 sets - 10 reps Seated Hip Adduction Squeeze with Ball - 1 x daily - 7 x weekly - 1 sets - 10 reps - 5 hold Sit to Stand with Hands on Knees - 1 x daily - 7 x weekly - 1 sets - 5 reps Seated Gluteal Sets - 1 x daily - 7 x weekly - 1 sets - 10 reps - 5 hold

## 2021-08-18 NOTE — Patient Instructions (Signed)

## 2021-08-18 NOTE — Progress Notes (Signed)
Fasting 87-89 2hr- 80-90's

## 2021-08-19 LAB — THYROID PANEL WITH TSH
Free Thyroxine Index: 1.2 (ref 1.2–4.9)
T3 Uptake Ratio: 12 % — ABNORMAL LOW (ref 24–39)
T4, Total: 9.8 ug/dL (ref 4.5–12.0)
TSH: 0.733 u[IU]/mL (ref 0.450–4.500)

## 2021-08-23 ENCOUNTER — Ambulatory Visit: Payer: Medicaid Other | Admitting: *Deleted

## 2021-08-23 ENCOUNTER — Other Ambulatory Visit: Payer: Self-pay

## 2021-08-23 ENCOUNTER — Encounter: Payer: Self-pay | Admitting: *Deleted

## 2021-08-23 ENCOUNTER — Ambulatory Visit: Payer: Medicaid Other | Attending: Obstetrics

## 2021-08-23 VITALS — BP 113/66 | HR 95

## 2021-08-23 DIAGNOSIS — J45909 Unspecified asthma, uncomplicated: Secondary | ICD-10-CM | POA: Diagnosis present

## 2021-08-23 DIAGNOSIS — O99511 Diseases of the respiratory system complicating pregnancy, first trimester: Secondary | ICD-10-CM | POA: Insufficient documentation

## 2021-08-23 DIAGNOSIS — Z8616 Personal history of COVID-19: Secondary | ICD-10-CM

## 2021-08-23 DIAGNOSIS — O99213 Obesity complicating pregnancy, third trimester: Secondary | ICD-10-CM

## 2021-08-23 DIAGNOSIS — O0990 Supervision of high risk pregnancy, unspecified, unspecified trimester: Secondary | ICD-10-CM

## 2021-08-23 DIAGNOSIS — O09523 Supervision of elderly multigravida, third trimester: Secondary | ICD-10-CM

## 2021-08-23 DIAGNOSIS — E669 Obesity, unspecified: Secondary | ICD-10-CM

## 2021-08-23 DIAGNOSIS — O99210 Obesity complicating pregnancy, unspecified trimester: Secondary | ICD-10-CM | POA: Diagnosis present

## 2021-08-23 DIAGNOSIS — O403XX0 Polyhydramnios, third trimester, not applicable or unspecified: Secondary | ICD-10-CM | POA: Diagnosis present

## 2021-08-23 DIAGNOSIS — Z3A33 33 weeks gestation of pregnancy: Secondary | ICD-10-CM

## 2021-08-23 IMAGING — US US MFM FETAL BPP W/O NON-STRESS
1 series · 14 of 28 positions shown · non-contrast
Comparison: none

[Series 1: us mfm fetal bpp w/o non-stress · 36 acquisitions, 14 frames shown]
[im 2/36]
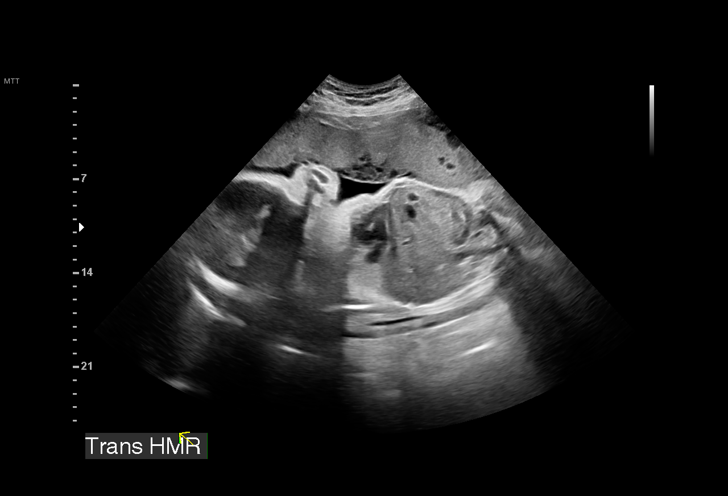
[im 4/36]
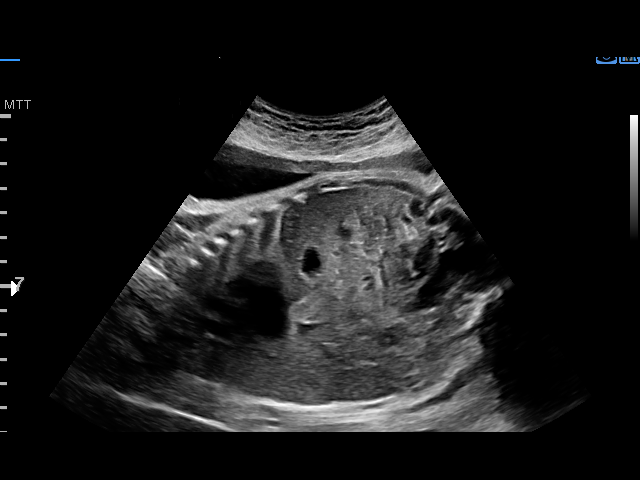
[im 7/36]
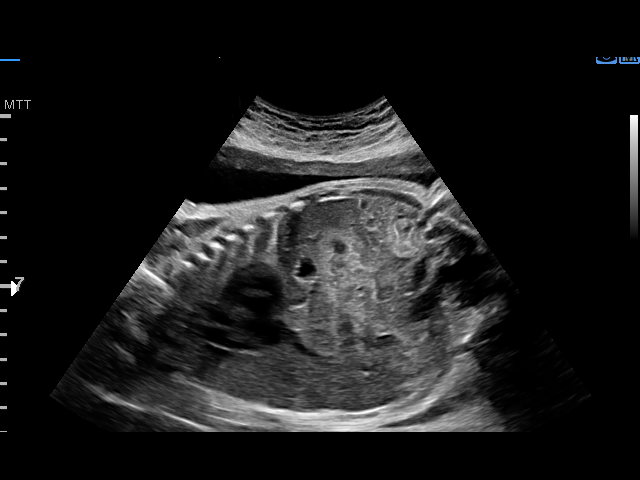
[im 10/36]
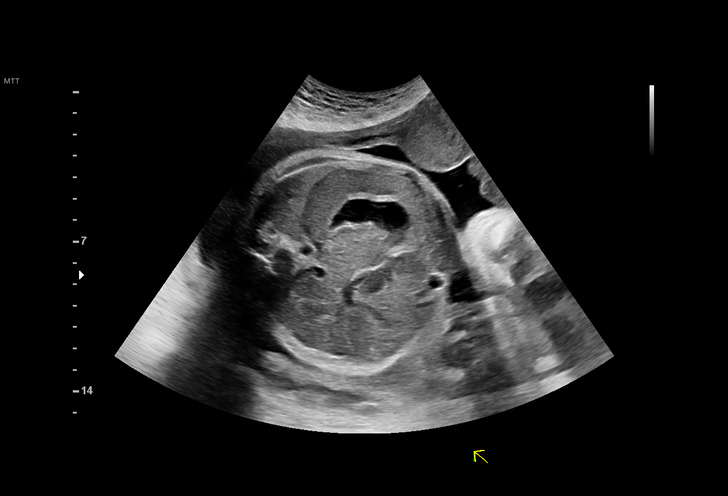
[im 12/36]
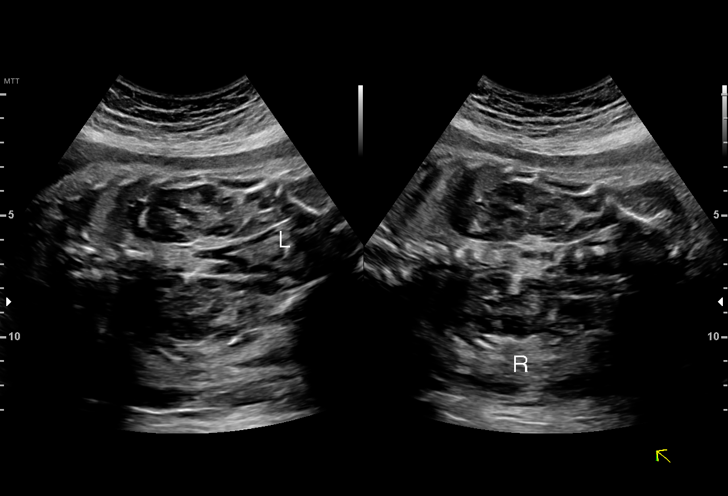
[im 15/36]
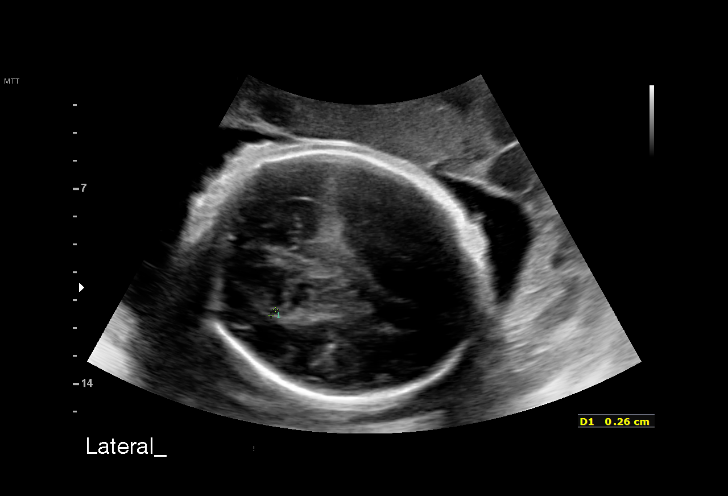
[im 17/36]
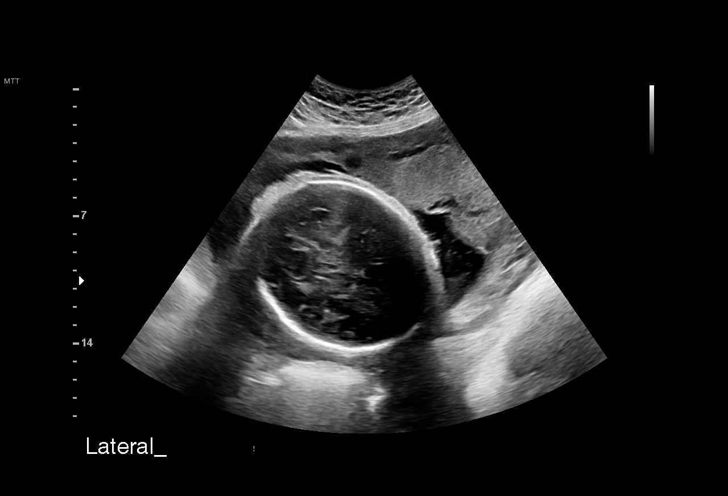
[im 20/36]
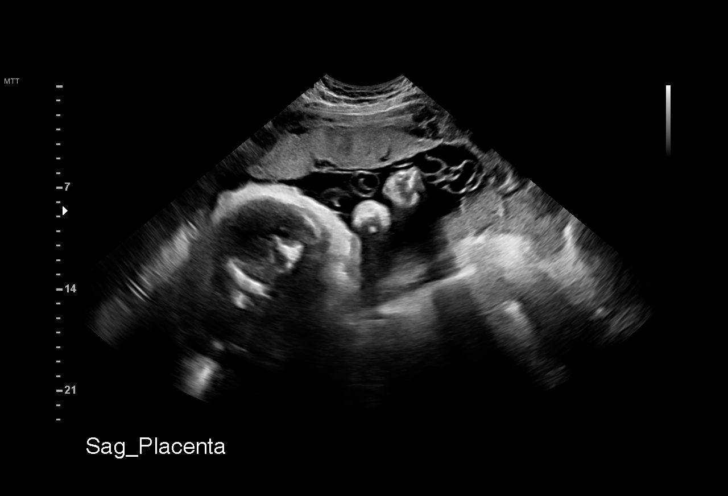
[im 23/36]
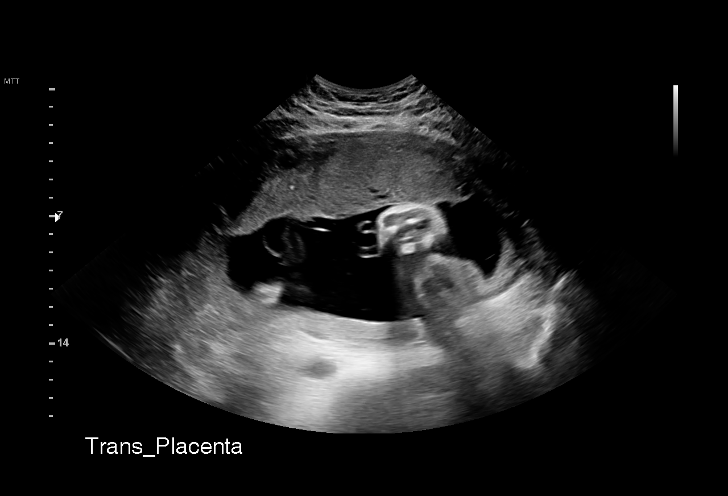
[im 25/36]
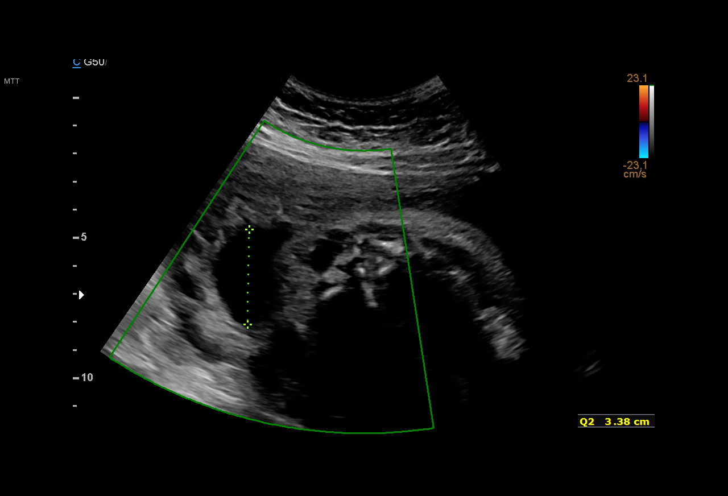
[im 28/36]
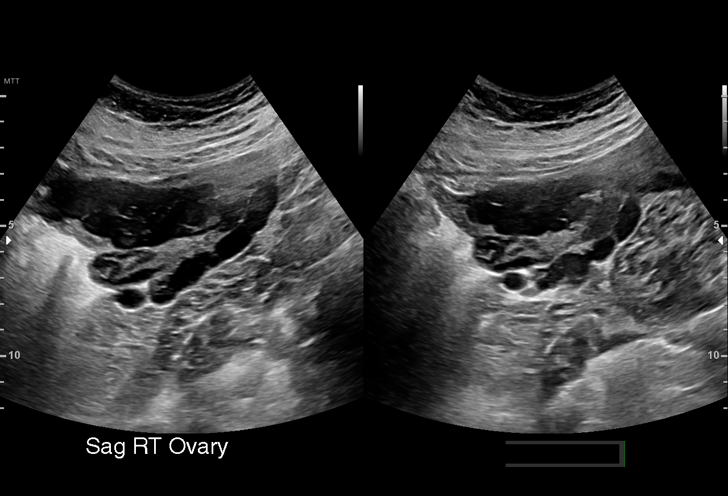
[im 30/36]
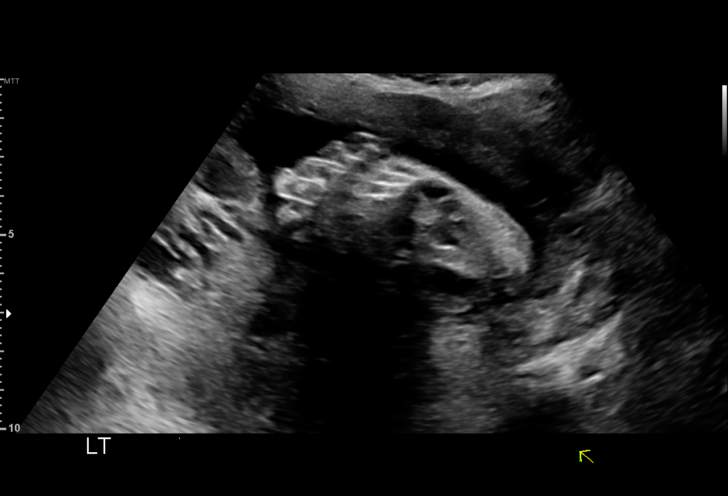
[im 33/36]
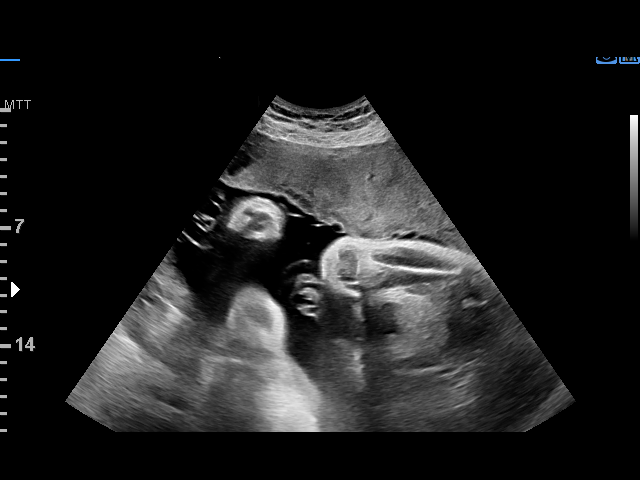
[im 36/36]
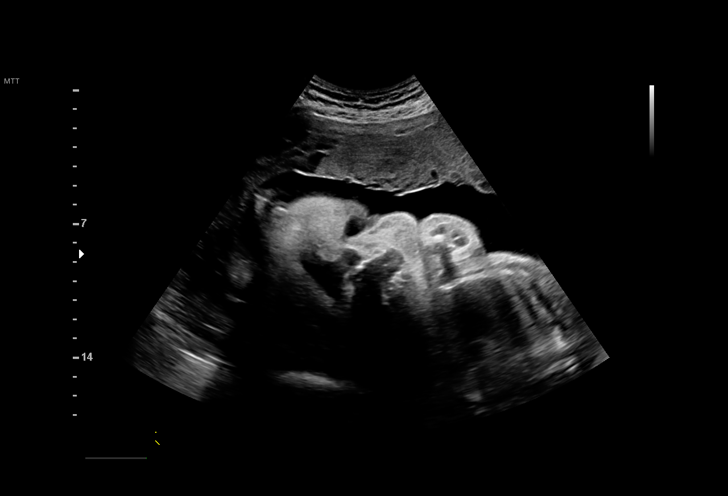

[14 of 28 positions shown; findings below may reference images not displayed]

Indications

 Polyhydramnios, third trimester, antepartum
 condition or complication, unspecified fetus
 33 weeks gestation of pregnancy
 Obesity complicating pregnancy, third
 trimester BMI 45
 Gestational diabetes in pregnancy,
 unspecified control
 Advanced maternal age multigravida 35+,
 third trimester
 Uterine fibroids affecting pregnancy in third  O34.13,
 trimester, antepartum
 Genetic carrier ([place])
Fetal Evaluation

 Num Of Fetuses:         1
 Fetal Heart Rate(bpm):  155
 Cardiac Activity:       Observed
 Presentation:           Transverse, head to maternal right
 Placenta:               Anterior
 P. Cord Insertion:      Previously Visualized

 Amniotic Fluid
 AFI FV:      Subjectively upper-normal

 AFI Sum(cm)     %Tile       Largest Pocket(cm)
 21.4            81
 RUQ(cm)       RLQ(cm)       LUQ(cm)        LLQ(cm)

Biophysical Evaluation

 Amniotic F.V:   Within normal limits       F. Tone:        Observed
 F. Movement:    Observed                   Score:          [DATE]
 F. Breathing:   Observed
OB History

 Gravidity:    3         Term:   2        Prem:   0        SAB:   0
 TOP:          0       Ectopic:  0        Living: 2
Gestational Age

 LMP:           31w 5d        Date:  01/13/21                 EDD:   10/20/21
 Best:          33w 5d     Det. By:  U/S  (05/23/21)          EDD:   10/06/21
Anatomy

 Cranium:               Previously seen        Aortic Arch:            Previously seen
 Cavum:                 Previously seen        Ductal Arch:            Previously seen
 Ventricles:            Appears normal         Diaphragm:              Appears normal
 Choroid Plexus:        Previously seen        Stomach:                Appears normal, left
                                                                       sided
 Cerebellum:            Previously seen        Abdomen:                Previously seen
 Posterior Fossa:       Previously seen        Abdominal Wall:         Previously seen
 Nuchal Fold:           Previously seen        Cord Vessels:           Previously seen
 Face:                  Orbits and profile     Kidneys:                Appear normal
                        previously seen
 Lips:                  Previously seen        Bladder:                Appears normal
 Thoracic:              Appears normal         Spine:                  Previously seen
 Heart:                 Appears normal         Upper Extremities:      Previously seen
                        (4CH, axis, and
                        situs)
 RVOT:                  Previously seen        Lower Extremities:      Previously seen
 LVOT:                  Previously seen

 Other:  3VT prev visualized. Technically difficult due to advanced GA and
         fetal position.
Cervix Uterus Adnexa

 Cervix
 Not visualized (advanced GA >54wks)

 Uterus
 Single fibroid noted previously.

 Right Ovary
 Within normal limits.

 Left Ovary
 Within normal limits.

 Cul De Sac
 No free fluid seen.
 Adnexa
 No abnormality visualized.
Impression

 Maternal obesity.  Patient return for antenatal testing.  She
 has gestational diabetes that is well controlled on diet.
 Mild polyhydramnios was seen at previous ultrasound.

 Amniotic fluid is normal good fetal activity seen.  Antenatal
 testing is reassuring.  BPP [DATE].

 We reassured the patient of the findings.
Recommendations

 -Continue weekly BPP till delivery.
                 Valentin, Ramel

## 2021-08-27 ENCOUNTER — Encounter: Payer: Medicaid Other | Admitting: Nurse Practitioner

## 2021-08-27 NOTE — Progress Notes (Signed)
Patient was contacted by Geryl Rankins , NP and told her we do not do OB appointmnet s on this platform. Mary-Margaret Hassell Done, FNP

## 2021-08-29 ENCOUNTER — Ambulatory Visit: Payer: Medicaid Other

## 2021-08-29 ENCOUNTER — Other Ambulatory Visit: Payer: Self-pay

## 2021-08-30 ENCOUNTER — Encounter: Payer: Self-pay | Admitting: *Deleted

## 2021-08-30 ENCOUNTER — Encounter: Payer: Medicaid Other | Attending: Family Medicine | Admitting: Registered"

## 2021-08-30 ENCOUNTER — Ambulatory Visit: Payer: Medicaid Other

## 2021-08-30 ENCOUNTER — Other Ambulatory Visit: Payer: Self-pay

## 2021-08-30 ENCOUNTER — Ambulatory Visit (INDEPENDENT_AMBULATORY_CARE_PROVIDER_SITE_OTHER): Payer: Medicaid Other | Admitting: Family Medicine

## 2021-08-30 ENCOUNTER — Ambulatory Visit (INDEPENDENT_AMBULATORY_CARE_PROVIDER_SITE_OTHER): Payer: Medicaid Other | Admitting: Registered"

## 2021-08-30 ENCOUNTER — Ambulatory Visit: Payer: Medicaid Other | Attending: Obstetrics | Admitting: *Deleted

## 2021-08-30 ENCOUNTER — Encounter: Payer: Self-pay | Admitting: Family Medicine

## 2021-08-30 ENCOUNTER — Ambulatory Visit (HOSPITAL_BASED_OUTPATIENT_CLINIC_OR_DEPARTMENT_OTHER): Payer: Medicaid Other | Admitting: *Deleted

## 2021-08-30 VITALS — BP 99/60 | HR 101 | Wt 248.7 lb

## 2021-08-30 VITALS — BP 112/62 | HR 105

## 2021-08-30 DIAGNOSIS — O24913 Unspecified diabetes mellitus in pregnancy, third trimester: Secondary | ICD-10-CM | POA: Diagnosis not present

## 2021-08-30 DIAGNOSIS — Z3A34 34 weeks gestation of pregnancy: Secondary | ICD-10-CM | POA: Diagnosis not present

## 2021-08-30 DIAGNOSIS — O3413 Maternal care for benign tumor of corpus uteri, third trimester: Secondary | ICD-10-CM | POA: Insufficient documentation

## 2021-08-30 DIAGNOSIS — D259 Leiomyoma of uterus, unspecified: Secondary | ICD-10-CM | POA: Diagnosis not present

## 2021-08-30 DIAGNOSIS — O09523 Supervision of elderly multigravida, third trimester: Secondary | ICD-10-CM | POA: Diagnosis not present

## 2021-08-30 DIAGNOSIS — O24410 Gestational diabetes mellitus in pregnancy, diet controlled: Secondary | ICD-10-CM

## 2021-08-30 DIAGNOSIS — O99210 Obesity complicating pregnancy, unspecified trimester: Secondary | ICD-10-CM

## 2021-08-30 DIAGNOSIS — D563 Thalassemia minor: Secondary | ICD-10-CM | POA: Insufficient documentation

## 2021-08-30 DIAGNOSIS — O403XX0 Polyhydramnios, third trimester, not applicable or unspecified: Secondary | ICD-10-CM

## 2021-08-30 DIAGNOSIS — O99213 Obesity complicating pregnancy, third trimester: Secondary | ICD-10-CM | POA: Insufficient documentation

## 2021-08-30 DIAGNOSIS — O409XX0 Polyhydramnios, unspecified trimester, not applicable or unspecified: Secondary | ICD-10-CM

## 2021-08-30 DIAGNOSIS — E669 Obesity, unspecified: Secondary | ICD-10-CM | POA: Insufficient documentation

## 2021-08-30 DIAGNOSIS — Z8616 Personal history of COVID-19: Secondary | ICD-10-CM

## 2021-08-30 DIAGNOSIS — Z6841 Body Mass Index (BMI) 40.0 and over, adult: Secondary | ICD-10-CM

## 2021-08-30 DIAGNOSIS — O0990 Supervision of high risk pregnancy, unspecified, unspecified trimester: Secondary | ICD-10-CM

## 2021-08-30 DIAGNOSIS — O3410 Maternal care for benign tumor of corpus uteri, unspecified trimester: Secondary | ICD-10-CM

## 2021-08-30 DIAGNOSIS — J45909 Unspecified asthma, uncomplicated: Secondary | ICD-10-CM

## 2021-08-30 DIAGNOSIS — O99511 Diseases of the respiratory system complicating pregnancy, first trimester: Secondary | ICD-10-CM

## 2021-08-30 DIAGNOSIS — R7989 Other specified abnormal findings of blood chemistry: Secondary | ICD-10-CM

## 2021-08-30 NOTE — Progress Notes (Signed)
States fetus is "not moving quite as much as before."

## 2021-08-30 NOTE — Progress Notes (Signed)
° °  Subjective:  Sandra Krueger is a 36 y.o. G3P2002 at [redacted]w[redacted]d being seen today for ongoing prenatal care.  She is currently monitored for the following issues for this high-risk pregnancy and has BMI 40.0-44.9, adult ([place]); Uterine fibroid in pregnancy; Supervision of high risk pregnancy, antepartum; AMA (advanced maternal age) multigravida 40+; History of COVID-19; Asthma affecting pregnancy in first trimester; Obesity in pregnancy; Low TSH level; Alpha thalassemia silent carrier; LGA (large for gestational age) fetus affecting management of mother; Polyhydramnios affecting pregnancy; and Gestational diabetes on their problem list.  Patient reports no complaints.  Contractions: Irritability. Vag. Bleeding: None.  Movement: Present. Denies leaking of fluid.   The following portions of the patient's history were reviewed and updated as appropriate: allergies, current medications, past family history, past medical history, past social history, past surgical history and problem list. Problem list updated.  Objective:   Vitals:   08/30/21 1031  BP: 99/60  Pulse: (!) 101  Weight: 248 lb 11.2 oz (112.8 kg)    Fetal Status: Fetal Heart Rate (bpm): 150   Movement: Present     General:  Alert, oriented and cooperative. Patient is in no acute distress.  Skin: Skin is warm and dry. No rash noted.   Cardiovascular: Normal heart rate noted  Respiratory: Normal respiratory effort, no problems with respiration noted  Abdomen: Soft, gravid, appropriate for gestational age. Pain/Pressure: Present     Pelvic: Vag. Bleeding: None     Cervical exam deferred        Extremities: Normal range of motion.     Mental Status: Normal mood and affect. Normal behavior. Normal judgment and thought content.   Urinalysis:      Assessment and Plan:  Pregnancy: G3P2002 at [redacted]w[redacted]d  1. Supervision of high risk pregnancy, antepartum BP and FHR normal Would like letter to take off work Discussed FMLA can start whenever,  will just lose time on the back end Given letter stating not unreasonable to start leave at this time  2. Diet controlled gestational diabetes mellitus (GDM) in third trimester Did not bring log today Reports fastings of 80's in the morning Post prandials not as consistent taking them, 90-100's Follow w MFM  3. Polyhydramnios affecting pregnancy 27.5 cm on last [place] Getting weekly [place] w MFM  9. Multigravida of advanced maternal age in third trimester   5. Low TSH level TSH normal last check  6. Obesity in pregnancy   7. Uterine fibroid in pregnancy 5-6 cm intramural posterior  Preterm labor symptoms and general obstetric precautions including but not limited to vaginal bleeding, contractions, leaking of fluid and fetal movement were reviewed in detail with the patient. Please refer to After Visit Summary for other counseling recommendations.  Return in 2 weeks (on 09/13/2021) for [facility], ob visit, needs MD.   Clarnce Flock, MD

## 2021-08-30 NOTE — Procedures (Signed)
GISELL BUEHRLE 1986/07/07 [redacted]w[redacted]d  Fetus A Non-Stress Test Interpretation for 08/30/21  Indication: Polyhydramnios, BMI 45, GDM-diet, AMA (35)  Fetal Heart Rate A Mode: External Baseline Rate (A): 145 bpm Variability: Moderate Accelerations: 15 x 15 Decelerations: None Multiple birth?: No  Uterine Activity Mode: Palpation, Toco Contraction Frequency (min): None Resting Tone Palpated: Relaxed Resting Time: Adequate  Interpretation (Fetal Testing) Nonstress Test Interpretation: Reactive Comments: Tracing MHR at times. Pulse ox appled to verify MHR. Cardio readjusted. Dr. Donalee Citrin reviewed tracing.

## 2021-08-30 NOTE — Progress Notes (Signed)
Patient was seen for Gestational Diabetes self-management on 08/30/21  Start time 1126 and End time 1202   Estimated due date: 10/06/20; [redacted]w[redacted]d  Clinical: Medications: reviewed Medical History: reviewed Labs: OGTT 1 hr 183 mg/dL, A1c 5.1%   Dietary and Lifestyle History: Pt reports she has been checking her blood sugar and mostly WNL. One elevated reading was after eating 2 slices of pizza. Pt states she eats a lot of vegetables and drinks mostly water.  Patient had OB appt prior to education visit and has Ultra sound this afternoon. Patient and her 36 yr old daughter were tired and hungry. Kept visit short and hit basics.  Physical Activity: not assessed Stress: not assessed Sleep: not assessed  24 hr Recall: not asssessed First Meal: Snack: Second meal: Snack: Third meal: Snack: Beverages:  NUTRITION INTERVENTION  Nutrition education (E-1) on the following topics:   Initial Follow-up  []  []  Definition of Gestational Diabetes [x]  []  Why dietary management is important in controlling blood glucose [x]  []  Effects each nutrient has on blood glucose levels []  []  Simple carbohydrates vs complex carbohydrates []  []  Fluid intake [x]  []  Creating a balanced meal plan [x]  []  Carbohydrate counting  [x]  []  When to check blood glucose levels [x]  []  Proper blood glucose monitoring techniques [x]  []  Effect of stress and stress reduction techniques  [x]  []  Exercise effect on blood glucose levels, appropriate exercise during pregnancy [x]  []  Importance of limiting caffeine and abstaining from alcohol and smoking [x]  []  Medications used for blood sugar control during pregnancy [x]  []  Hypoglycemia and rule of 15 [x]  []  Postpartum self care  Patient already has a meter, is testing pre breakfast and 2 hours after each meal. FBS: mostly WNL Postprandial: mostly WNL  Patient instructed to monitor glucose levels: FBS: 60 - ? 95 mg/dL (some clinics use 90 for cutoff) 1 hour: ? 140  mg/dL 2 hour: ? 120 mg/dL  Patient received handouts: Nutrition Diabetes and Pregnancy Carbohydrate Counting List  Patient will be seen for follow-up as needed.

## 2021-08-30 NOTE — Patient Instructions (Signed)

## 2021-09-05 ENCOUNTER — Ambulatory Visit: Payer: Medicaid Other | Attending: Obstetrics

## 2021-09-05 ENCOUNTER — Encounter: Payer: Self-pay | Admitting: *Deleted

## 2021-09-05 ENCOUNTER — Other Ambulatory Visit: Payer: Self-pay

## 2021-09-05 ENCOUNTER — Other Ambulatory Visit: Payer: Self-pay | Admitting: *Deleted

## 2021-09-05 ENCOUNTER — Ambulatory Visit: Payer: Medicaid Other | Admitting: *Deleted

## 2021-09-05 VITALS — BP 104/63 | HR 85

## 2021-09-05 DIAGNOSIS — O0990 Supervision of high risk pregnancy, unspecified, unspecified trimester: Secondary | ICD-10-CM | POA: Diagnosis present

## 2021-09-05 DIAGNOSIS — O09523 Supervision of elderly multigravida, third trimester: Secondary | ICD-10-CM | POA: Insufficient documentation

## 2021-09-05 DIAGNOSIS — O99210 Obesity complicating pregnancy, unspecified trimester: Secondary | ICD-10-CM

## 2021-09-05 DIAGNOSIS — D563 Thalassemia minor: Secondary | ICD-10-CM | POA: Insufficient documentation

## 2021-09-05 DIAGNOSIS — Z8616 Personal history of COVID-19: Secondary | ICD-10-CM

## 2021-09-05 DIAGNOSIS — O24913 Unspecified diabetes mellitus in pregnancy, third trimester: Secondary | ICD-10-CM | POA: Diagnosis present

## 2021-09-05 DIAGNOSIS — O99511 Diseases of the respiratory system complicating pregnancy, first trimester: Secondary | ICD-10-CM | POA: Insufficient documentation

## 2021-09-05 DIAGNOSIS — O99213 Obesity complicating pregnancy, third trimester: Secondary | ICD-10-CM | POA: Insufficient documentation

## 2021-09-05 DIAGNOSIS — O403XX0 Polyhydramnios, third trimester, not applicable or unspecified: Secondary | ICD-10-CM | POA: Diagnosis present

## 2021-09-05 DIAGNOSIS — J45909 Unspecified asthma, uncomplicated: Secondary | ICD-10-CM | POA: Insufficient documentation

## 2021-09-05 DIAGNOSIS — O24410 Gestational diabetes mellitus in pregnancy, diet controlled: Secondary | ICD-10-CM | POA: Diagnosis present

## 2021-09-05 DIAGNOSIS — D259 Leiomyoma of uterus, unspecified: Secondary | ICD-10-CM | POA: Diagnosis present

## 2021-09-05 DIAGNOSIS — O24419 Gestational diabetes mellitus in pregnancy, unspecified control: Secondary | ICD-10-CM

## 2021-09-05 IMAGING — US US MFM FETAL BPP W/O NON-STRESS
1 series · 14 of 28 positions shown · non-contrast
Comparison: none

[Series 1: us mfm fetal bpp w/o non-stress · 30 acquisitions, 14 frames shown]
[im 2/30]
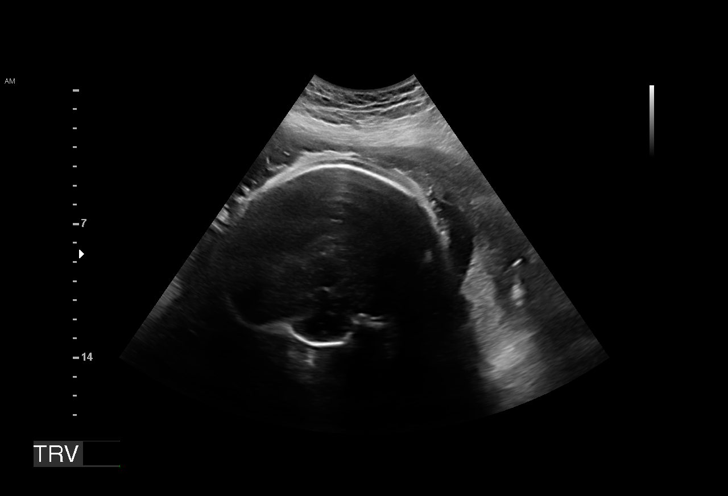
[im 4/30]
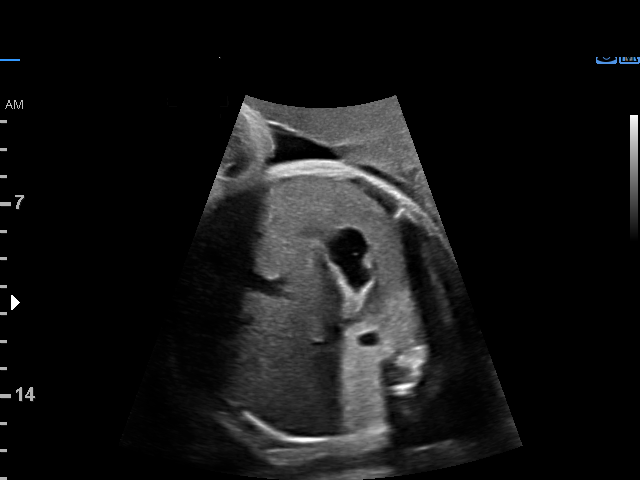
[im 6/30]
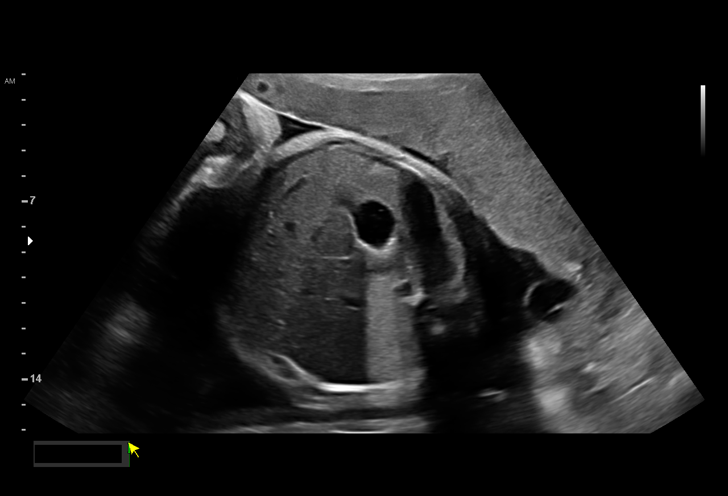
[im 8/30]
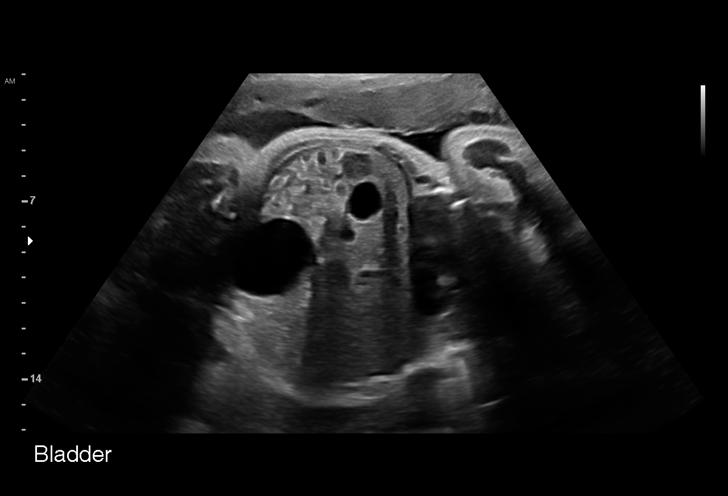
[im 10/30]
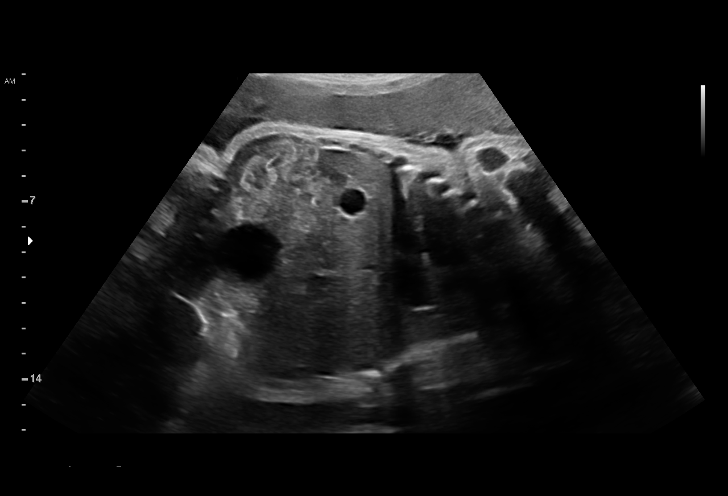
[im 12/30]
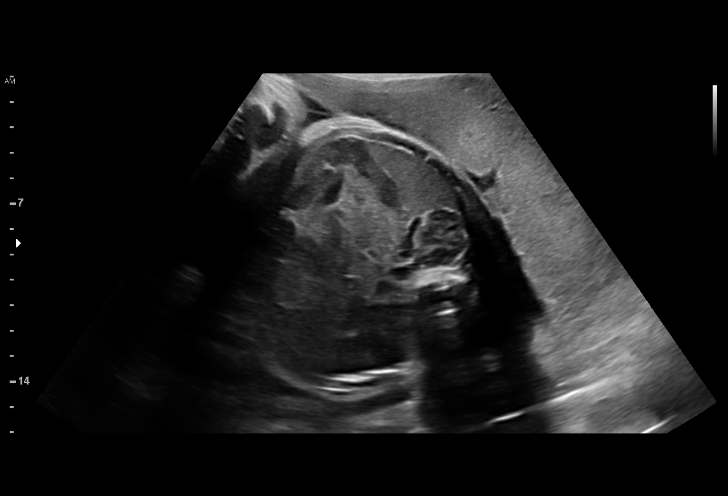
[im 14/30]
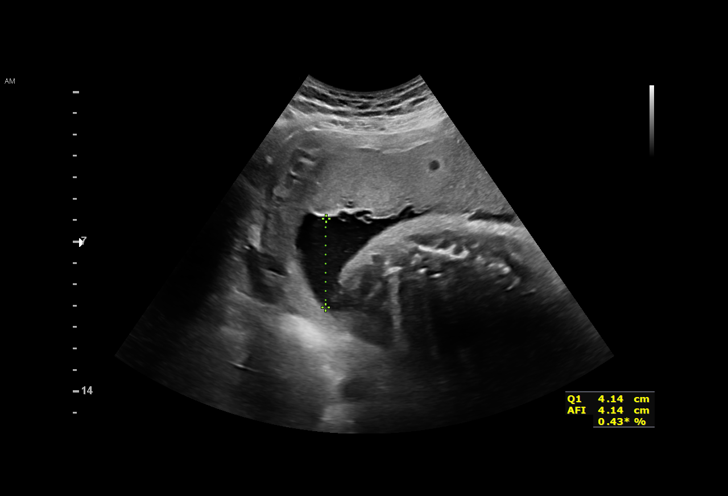
[im 17/30]
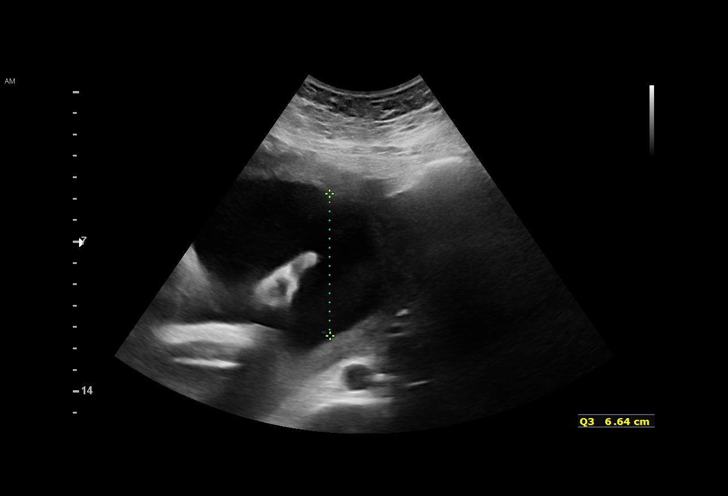
[im 19/30]
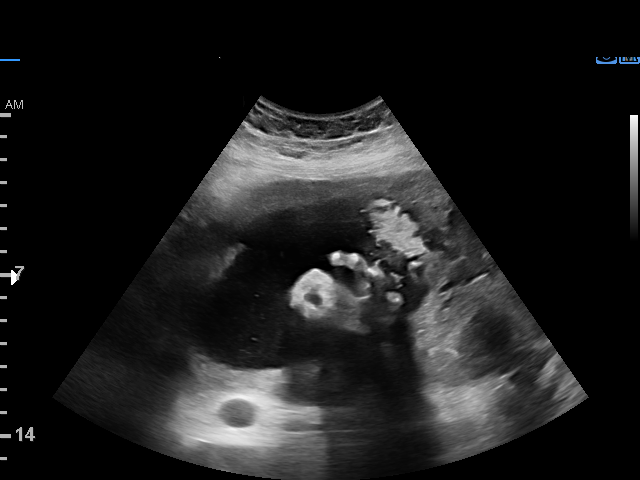
[im 21/30]
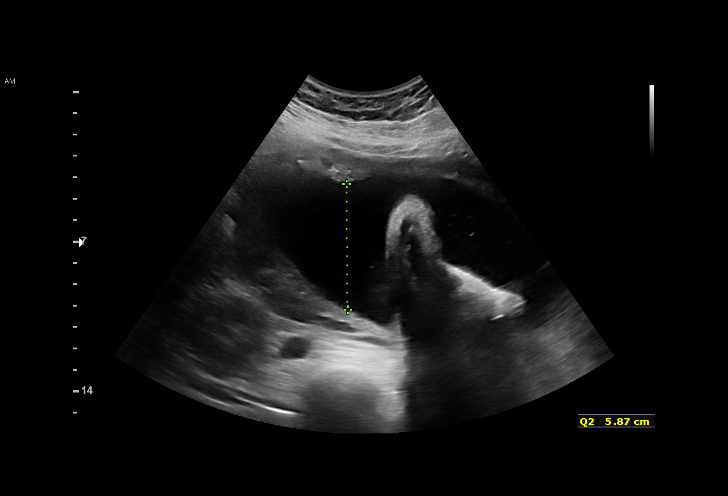
[im 23/30]
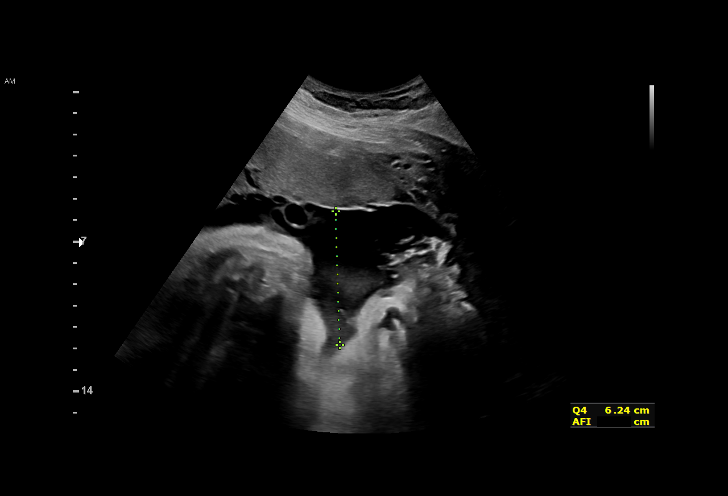
[im 25/30]
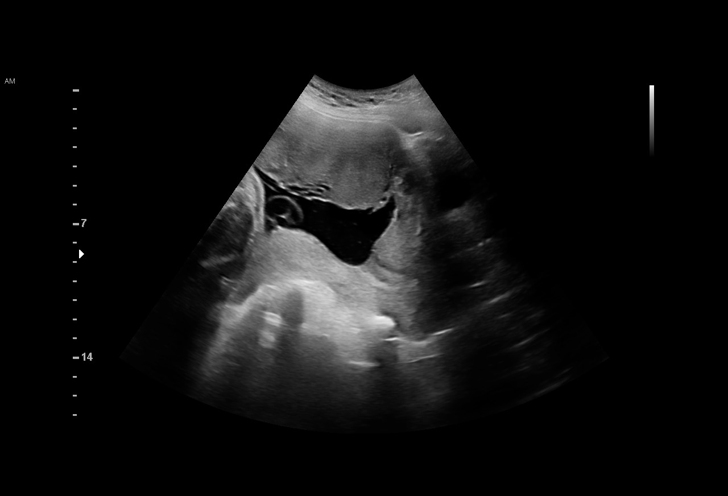
[im 27/30]
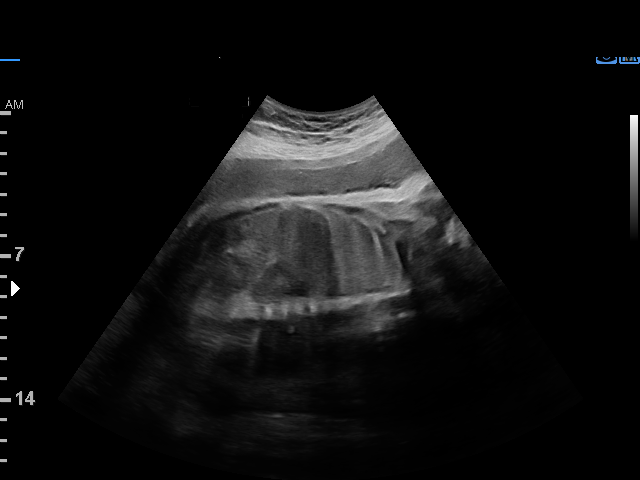
[im 30/30]
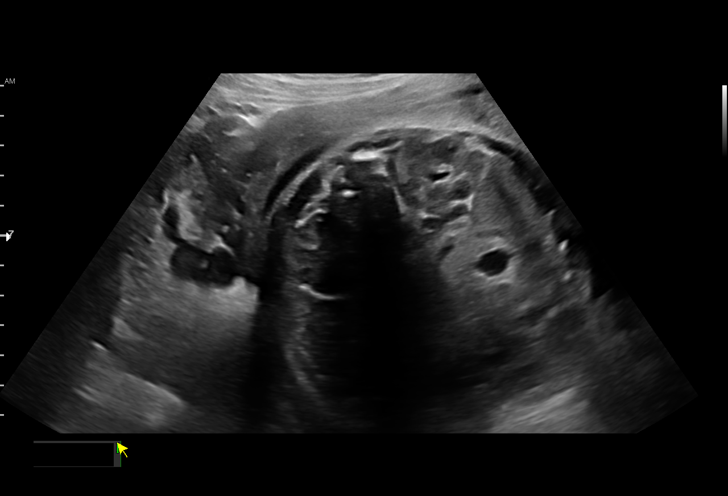

[14 of 28 positions shown; findings below may reference images not displayed]

Indications

 Obesity complicating pregnancy, third
 trimester BMI 45
 Gestational diabetes in pregnancy,
 unspecified control
 35 weeks gestation of pregnancy
 Advanced maternal age multigravida 35+,
 third trimester
 Uterine fibroids affecting pregnancy in third  O34.13,
 trimester, antepartum
 Genetic carrier (Yigit Can Jeans)
 LR NIPS
Fetal Evaluation

 Num Of Fetuses:         1
 Fetal Heart Rate(bpm):  138
 Cardiac Activity:       Observed
 Presentation:           Transverse, head to maternal left
 Placenta:               Anterior
 P. Cord Insertion:      Previously Visualized

 Amniotic Fluid
 AFI FV:      Within normal limits

 AFI Sum(cm)     %Tile       Largest Pocket(cm)
 21.43           81

 RUQ(cm)       RLQ(cm)       LUQ(cm)        LLQ(cm)

Biophysical Evaluation

 Amniotic F.V:   Within normal limits       F. Tone:        Observed
 F. Movement:    Observed                   Score:          [DATE]
 F. Breathing:   Observed
OB History

 Gravidity:    3         Term:   2        Prem:   0        SAB:   0
 TOP:          0       Ectopic:  0        Living: 2
Gestational Age

 LMP:           33w 4d        Date:  01/13/21                 EDD:   10/20/21
 Best:          35w 4d     Det. By:  U/S  (05/23/21)          EDD:   10/06/21
Anatomy

 Cranium:               Previously seen        Aortic Arch:            Previously seen
 Cavum:                 Previously seen        Ductal Arch:            Previously seen
 Ventricles:            Appears normal         Diaphragm:              Appears normal
 Choroid Plexus:        Previously seen        Stomach:                Appears normal, left
                                                                       sided
 Cerebellum:            Previously seen        Abdomen:                Previously seen
 Posterior Fossa:       Previously seen        Abdominal Wall:         Previously seen
 Nuchal Fold:           Previously seen        Cord Vessels:           Previously seen
 Face:                  Orbits and profile     Kidneys:                Appear normal
                        previously seen
 Lips:                  Previously seen        Bladder:                Appears normal
 Thoracic:              Appears normal         Spine:                  Previously seen
 Heart:                 Appears normal         Upper Extremities:      Previously seen
                        (4CH, axis, and
                        situs)
 RVOT:                  Previously seen        Lower Extremities:      Previously seen
 LVOT:                  Previously seen

 Other:  3VT prev visualized.
Cervix Uterus Adnexa

 Cervix
 Length:           3.77  cm.
 Normal appearance by transabdominal scan.

 Uterus
 Single fibroid previously noted

 Right Ovary
 Not visualized.

 Left Ovary
 Not visualized.
Comments

 This patient was seen for a BPP due to maternal obesity and
 diet controlled gestational diabetes.  She denies any
 problems since her last exam.
 A biophysical profile performed today was [DATE].
 There was normal amniotic fluid noted today.
 She will return in 1 week for another BPP and growth scan.

## 2021-09-12 ENCOUNTER — Ambulatory Visit: Payer: Medicaid Other

## 2021-09-14 ENCOUNTER — Encounter (HOSPITAL_COMMUNITY): Payer: Self-pay

## 2021-09-14 ENCOUNTER — Ambulatory Visit: Payer: Medicaid Other | Attending: Obstetrics

## 2021-09-14 ENCOUNTER — Ambulatory Visit: Payer: Medicaid Other | Admitting: *Deleted

## 2021-09-14 ENCOUNTER — Ambulatory Visit (INDEPENDENT_AMBULATORY_CARE_PROVIDER_SITE_OTHER): Payer: Medicaid Other | Admitting: Family Medicine

## 2021-09-14 ENCOUNTER — Telehealth (HOSPITAL_COMMUNITY): Payer: Self-pay | Admitting: *Deleted

## 2021-09-14 ENCOUNTER — Other Ambulatory Visit (HOSPITAL_COMMUNITY)
Admission: RE | Admit: 2021-09-14 | Discharge: 2021-09-14 | Disposition: A | Discharge status: Home / Self Care | Payer: Medicaid Other | Source: Ambulatory Visit | Attending: Family Medicine | Admitting: Family Medicine

## 2021-09-14 ENCOUNTER — Other Ambulatory Visit: Payer: Self-pay

## 2021-09-14 ENCOUNTER — Encounter: Payer: Self-pay | Admitting: Family Medicine

## 2021-09-14 ENCOUNTER — Encounter (HOSPITAL_COMMUNITY): Payer: Self-pay | Admitting: *Deleted

## 2021-09-14 VITALS — BP 114/76 | HR 66 | Wt 252.2 lb

## 2021-09-14 VITALS — BP 116/74 | HR 100

## 2021-09-14 DIAGNOSIS — O409XX0 Polyhydramnios, unspecified trimester, not applicable or unspecified: Secondary | ICD-10-CM

## 2021-09-14 DIAGNOSIS — O3663X0 Maternal care for excessive fetal growth, third trimester, not applicable or unspecified: Secondary | ICD-10-CM

## 2021-09-14 DIAGNOSIS — O24913 Unspecified diabetes mellitus in pregnancy, third trimester: Secondary | ICD-10-CM | POA: Diagnosis present

## 2021-09-14 DIAGNOSIS — O0990 Supervision of high risk pregnancy, unspecified, unspecified trimester: Secondary | ICD-10-CM | POA: Insufficient documentation

## 2021-09-14 DIAGNOSIS — Z8616 Personal history of COVID-19: Secondary | ICD-10-CM | POA: Diagnosis present

## 2021-09-14 DIAGNOSIS — J45909 Unspecified asthma, uncomplicated: Secondary | ICD-10-CM

## 2021-09-14 DIAGNOSIS — O24410 Gestational diabetes mellitus in pregnancy, diet controlled: Secondary | ICD-10-CM

## 2021-09-14 DIAGNOSIS — O99210 Obesity complicating pregnancy, unspecified trimester: Secondary | ICD-10-CM | POA: Insufficient documentation

## 2021-09-14 DIAGNOSIS — O24419 Gestational diabetes mellitus in pregnancy, unspecified control: Secondary | ICD-10-CM | POA: Diagnosis not present

## 2021-09-14 DIAGNOSIS — D563 Thalassemia minor: Secondary | ICD-10-CM | POA: Diagnosis present

## 2021-09-14 DIAGNOSIS — O09523 Supervision of elderly multigravida, third trimester: Secondary | ICD-10-CM

## 2021-09-14 DIAGNOSIS — O99213 Obesity complicating pregnancy, third trimester: Secondary | ICD-10-CM | POA: Insufficient documentation

## 2021-09-14 DIAGNOSIS — O99511 Diseases of the respiratory system complicating pregnancy, first trimester: Secondary | ICD-10-CM

## 2021-09-14 DIAGNOSIS — R7989 Other specified abnormal findings of blood chemistry: Secondary | ICD-10-CM

## 2021-09-14 DIAGNOSIS — D259 Leiomyoma of uterus, unspecified: Secondary | ICD-10-CM | POA: Insufficient documentation

## 2021-09-14 DIAGNOSIS — Z3A36 36 weeks gestation of pregnancy: Secondary | ICD-10-CM

## 2021-09-14 DIAGNOSIS — O403XX0 Polyhydramnios, third trimester, not applicable or unspecified: Secondary | ICD-10-CM | POA: Insufficient documentation

## 2021-09-14 DIAGNOSIS — O3410 Maternal care for benign tumor of corpus uteri, unspecified trimester: Secondary | ICD-10-CM

## 2021-09-14 IMAGING — US US MFM FETAL BPP W/O NON-STRESS
1 series · 14 of 28 positions shown · non-contrast
Comparison: none

[Series 1: us mfm fetal bpp w/o non-stress · 45 acquisitions, 14 frames shown]
[im 2/45]
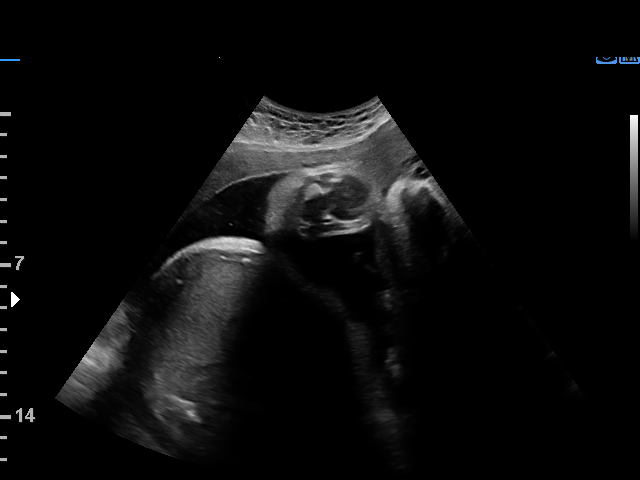
[im 5/45]
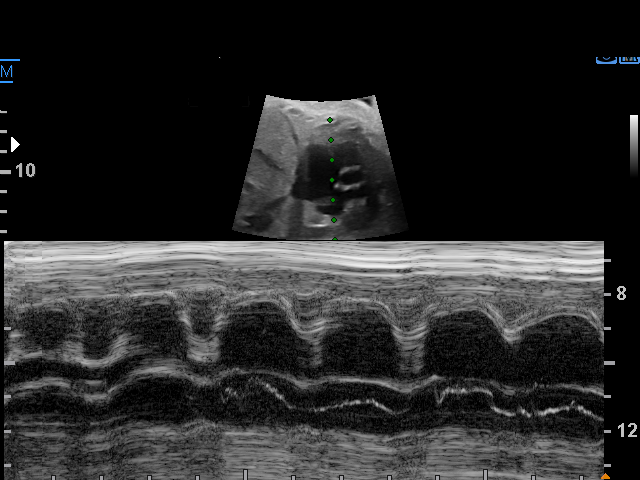
[im 9/45]
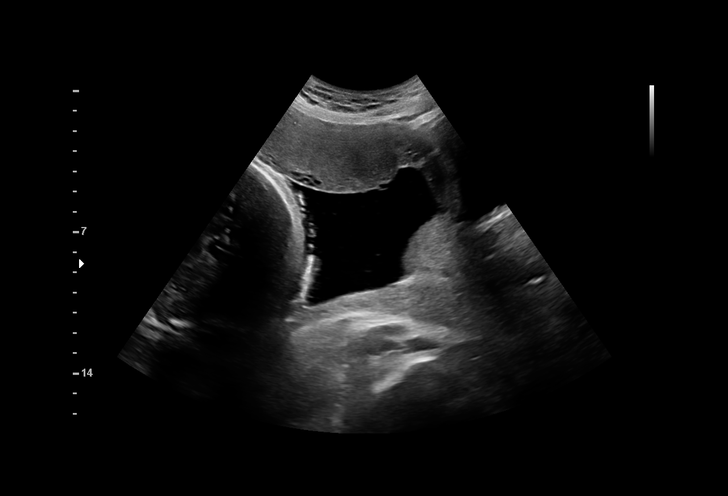
[im 12/45]
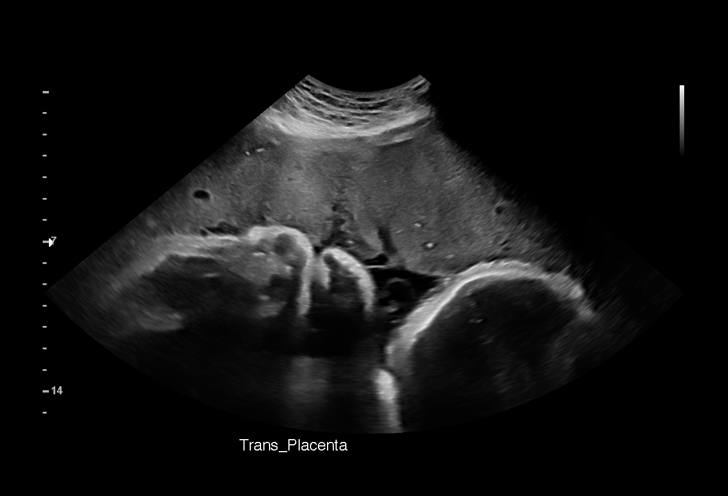
[im 15/45]
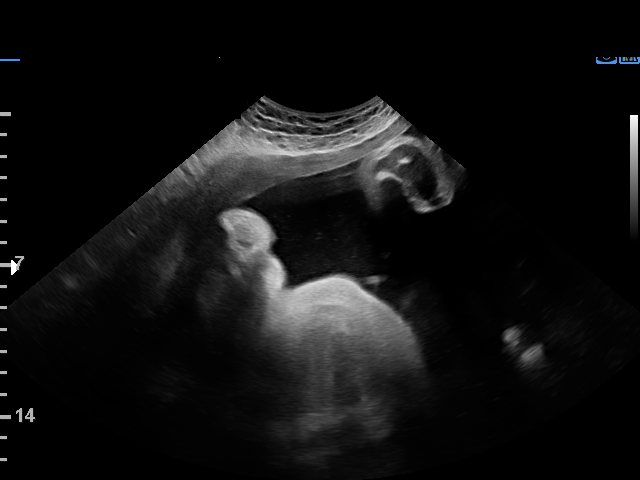
[im 18/45]
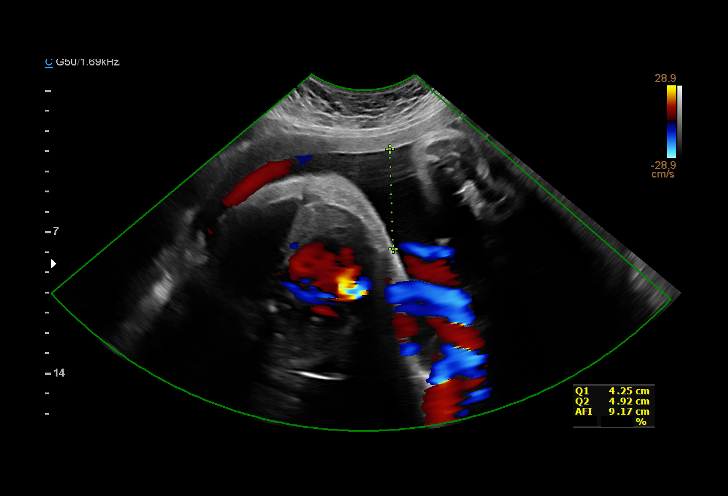
[im 22/45]
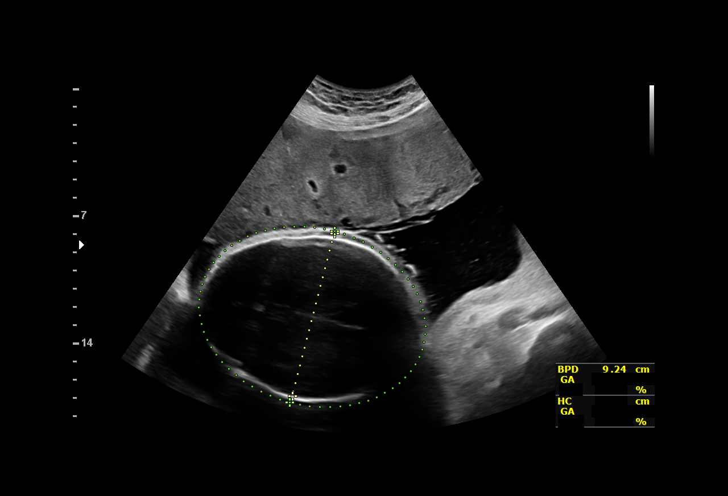
[im 25/45]
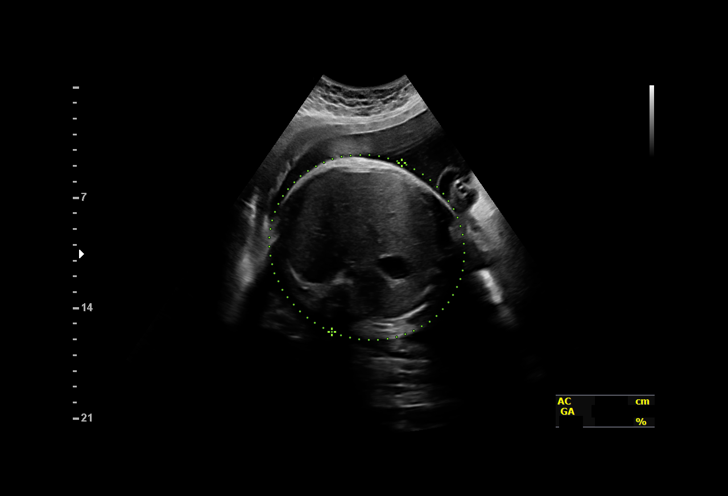
[im 28/45]
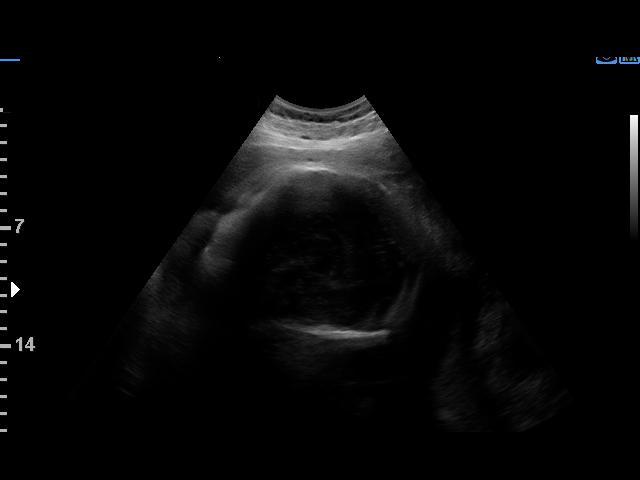
[im 31/45]
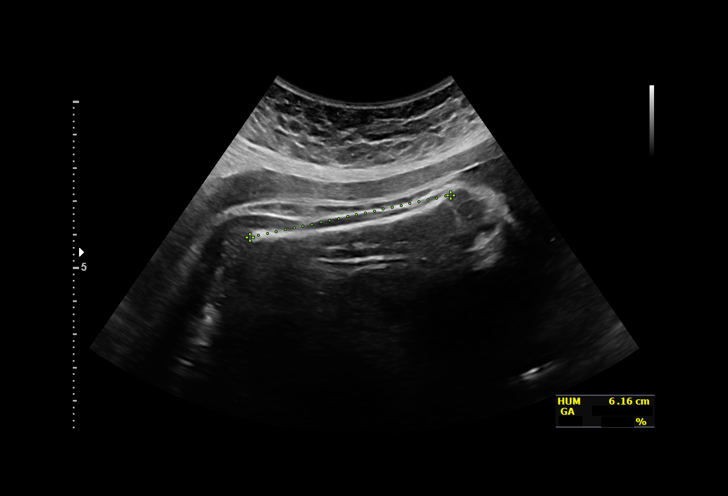
[im 35/45]
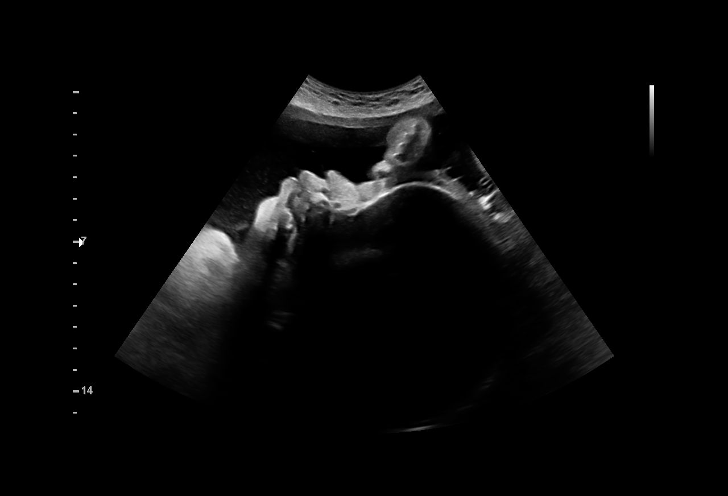
[im 38/45]
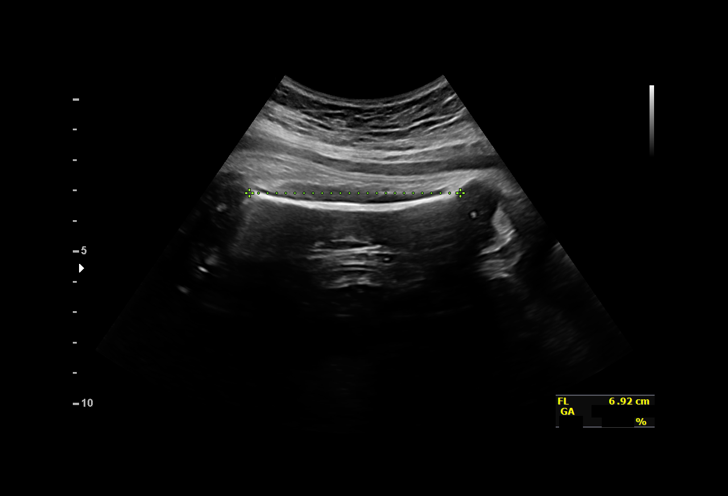
[im 41/45]
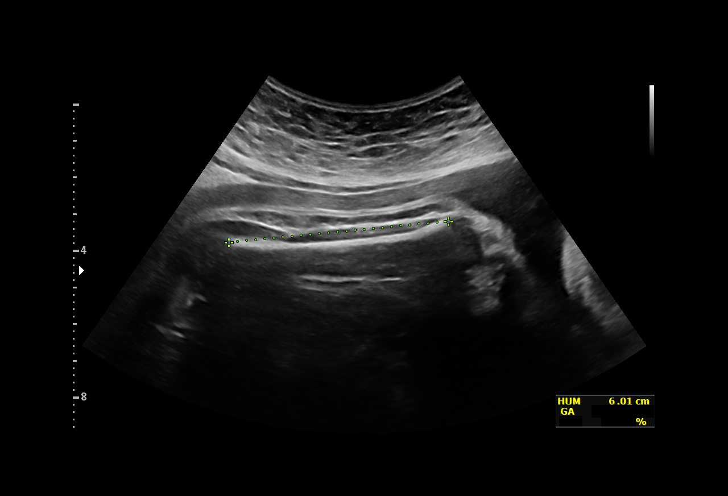
[im 45/45]
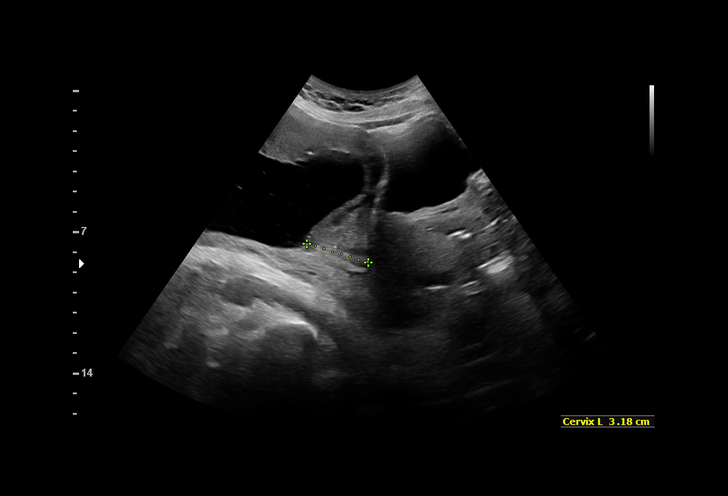

[14 of 28 positions shown; findings below may reference images not displayed]

Indications

 36 weeks gestation of pregnancy
 Obesity complicating pregnancy, third
 trimester BMI 45
 Gestational diabetes in pregnancy,
 unspecified control
 Advanced maternal age multigravida 35+,
 third trimester
 Uterine fibroids affecting pregnancy in third  O34.13,
 trimester, antepartum
 Genetic carrier (Gian-Reto Nicolet)
 LR NIPS
Fetal Evaluation

 Num Of Fetuses:         1
 Fetal Heart Rate(bpm):  144
 Cardiac Activity:       Observed
 Presentation:           Transverse, head to maternal left
 Placenta:               Anterior
 P. Cord Insertion:      Previously Visualized

 Amniotic Fluid
 AFI FV:      Within normal limits

 AFI Sum(cm)     %Tile       Largest Pocket(cm)
 16.54           62
 RUQ(cm)       RLQ(cm)       LUQ(cm)        LLQ(cm)

Biophysical Evaluation

 Amniotic F.V:   Pocket => 2 cm             F. Tone:        Observed
 F. Movement:    Observed                   Score:          [DATE]
 F. Breathing:   Observed
Biometry

 BPD:      91.7  mm     G. Age:  37w 2d         74  %    CI:        69.74   %    70 - 86
                                                         FL/HC:      19.9   %    20.8 -
 HC:      350.4  mm     G. Age:  40w 6d         96  %    HC/AC:      0.94        0.92 -
 AC:      371.1  mm     G. Age:  41w 0d       > 99  %    FL/BPD:     76.2   %    71 - 87
 FL:       69.9  mm     G. Age:  35w 6d         23  %    FL/AC:      18.8   %    20 - 24
 HUM:      60.7  mm     G. Age:  35w 1d         37  %

 Est. FW:    6999  gm      8 lb 6 oz     98  %
OB History

 Gravidity:    3         Term:   2        Prem:   0        SAB:   0
 TOP:          0       Ectopic:  0        Living: 2
Gestational Age

 LMP:           34w 6d        Date:  01/13/21                 EDD:   10/20/21
 U/S Today:     38w 5d                                        EDD:   09/23/21
 Best:          36w 6d     Det. By:  U/S  (05/23/21)          EDD:   10/06/21
Anatomy

 Cranium:               Appears normal         Aortic Arch:            Previously seen
 Cavum:                 Previously seen        Ductal Arch:            Previously seen
 Ventricles:            Appears normal         Diaphragm:              Appears normal
 Choroid Plexus:        Previously seen        Stomach:                Appears normal, left
                                                                       sided
 Cerebellum:            Previously seen        Abdomen:                Previously seen
 Posterior Fossa:       Previously seen        Abdominal Wall:         Previously seen
 Nuchal Fold:           Not applicable (>20    Cord Vessels:           Previously seen
                        wks GA)
 Face:                  Orbits and profile     Kidneys:                Appear normal
                        previously seen
 Lips:                  Previously seen        Bladder:                Appears normal
 Thoracic:              Appears normal         Spine:                  Previously seen
 Heart:                 Appears normal         Upper Extremities:      Previously seen
                        (4CH, axis, and
                        situs)
 RVOT:                  Previously seen        Lower Extremities:      Previously seen
 LVOT:                  Previously seen

 Other:  3VT prev visualized.
Cervix Uterus Adnexa
 Cervix
 Length:           3.18  cm.
 Normal appearance by transabdominal scan.
Impression

 Follow up growth due to elevated BMI and PWAX6
 Normal interval growth with measurements consistent with
 dates
 Good fetal movement and amniotic fluid volume
 Biophysical profile [DATE]
Recommendations

 Continue weekly testing.
 Plan for delivery at 39 weeks.

## 2021-09-14 NOTE — Telephone Encounter (Signed)
Preadmission screen  

## 2021-09-14 NOTE — Patient Instructions (Signed)

## 2021-09-14 NOTE — Progress Notes (Signed)
° °  Subjective:  Sandra Krueger is a 36 y.o. G3P2002 at [redacted]w[redacted]d being seen today for ongoing prenatal care.  She is currently monitored for the following issues for this high-risk pregnancy and has BMI 40.0-44.9, adult ([place]); Uterine fibroid in pregnancy; Supervision of high risk pregnancy, antepartum; AMA (advanced maternal age) multigravida 35+; History of COVID-19; Asthma affecting pregnancy in first trimester; Obesity in pregnancy; Low TSH level; Alpha thalassemia silent carrier; LGA (large for gestational age) fetus affecting management of mother; Polyhydramnios affecting pregnancy; and Gestational diabetes on their problem list.  Patient reports no complaints.  Contractions: Irregular. Vag. Bleeding: None.  Movement: Present. Denies leaking of fluid.   The following portions of the patient's history were reviewed and updated as appropriate: allergies, current medications, past family history, past medical history, past social history, past surgical history and problem list. Problem list updated.  Objective:   Vitals:   09/14/21 1140  BP: 114/76  Pulse: 66  Weight: 252 lb 3.2 oz (114.4 kg)    Fetal Status: Fetal Heart Rate (bpm): 149   Movement: Present  Presentation: Undeterminable  General:  Alert, oriented and cooperative. Patient is in no acute distress.  Skin: Skin is warm and dry. No rash noted.   Cardiovascular: Normal heart rate noted  Respiratory: Normal respiratory effort, no problems with respiration noted  Abdomen: Soft, gravid, appropriate for gestational age. Pain/Pressure: Present     Pelvic: Vag. Bleeding: None     Cervical exam performed Dilation: Closed Effacement (%): Thick Station: -3  Extremities: Normal range of motion.  Edema: Trace  Mental Status: Normal mood and affect. Normal behavior. Normal judgment and thought content.   Urinalysis:      Assessment and Plan:  Pregnancy: G3P2002 at [redacted]w[redacted]d  1. Supervision of high risk pregnancy, antepartum BP and FHR  normal Swabs today - Culture, beta strep (group b only) - GC/Chlamydia probe amp ([place])not at [facility]  2. Diet controlled gestational diabetes mellitus (GDM) in third trimester Did not bring log Fastings on recall in the 80's, highest post prandial she recalls is 114 Following w MFM for weekly BPP Previously w poly that resolved on last scan Has growth [place] and BPP scheduled for later today Discussed 39wk IOL, she is amenable Orders placed, form faxed  3. Asthma affecting pregnancy in first trimester stable  4. Uterine fibroid in pregnancy 5-6 cm intramural posterior  5. Multigravida of advanced maternal age in third trimester   6. Obesity in pregnancy   7. Low TSH level Normal TFT's earlier last month  8. Excessive fetal growth affecting management of pregnancy in third trimester, single or unspecified fetus Has growth [place] today  9. Polyhydramnios affecting pregnancy Resolved on most recent BPP from last week, AFI 21 cm at that time Has follow up scheduled today   Preterm labor symptoms and general obstetric precautions including but not limited to vaginal bleeding, contractions, leaking of fluid and fetal movement were reviewed in detail with the patient. Please refer to After Visit Summary for other counseling recommendations.  Return in 1 week (on 09/21/2021) for [facility], ob visit, needs MD.   Clarnce Flock, MD

## 2021-09-15 ENCOUNTER — Encounter: Payer: Self-pay | Admitting: *Deleted

## 2021-09-15 LAB — GC/CHLAMYDIA PROBE AMP (~~LOC~~) NOT AT ARMC
Chlamydia: NEGATIVE
Comment: NEGATIVE
Comment: NORMAL
Neisseria Gonorrhea: NEGATIVE

## 2021-09-18 LAB — CULTURE, BETA STREP (GROUP B ONLY): Strep Gp B Culture: NEGATIVE

## 2021-09-20 ENCOUNTER — Other Ambulatory Visit: Payer: Self-pay

## 2021-09-20 ENCOUNTER — Ambulatory Visit: Payer: Medicaid Other | Admitting: *Deleted

## 2021-09-20 ENCOUNTER — Ambulatory Visit (INDEPENDENT_AMBULATORY_CARE_PROVIDER_SITE_OTHER): Payer: Medicaid Other | Admitting: Family Medicine

## 2021-09-20 ENCOUNTER — Encounter: Payer: Self-pay | Admitting: Family Medicine

## 2021-09-20 ENCOUNTER — Encounter: Payer: Self-pay | Admitting: *Deleted

## 2021-09-20 ENCOUNTER — Other Ambulatory Visit: Payer: Self-pay | Admitting: Family Medicine

## 2021-09-20 ENCOUNTER — Ambulatory Visit: Payer: Medicaid Other | Attending: Obstetrics

## 2021-09-20 VITALS — BP 115/65 | HR 93

## 2021-09-20 VITALS — BP 108/77 | HR 106 | Wt 250.0 lb

## 2021-09-20 DIAGNOSIS — D563 Thalassemia minor: Secondary | ICD-10-CM

## 2021-09-20 DIAGNOSIS — O99511 Diseases of the respiratory system complicating pregnancy, first trimester: Secondary | ICD-10-CM

## 2021-09-20 DIAGNOSIS — O0990 Supervision of high risk pregnancy, unspecified, unspecified trimester: Secondary | ICD-10-CM

## 2021-09-20 DIAGNOSIS — E668 Other obesity: Secondary | ICD-10-CM | POA: Diagnosis not present

## 2021-09-20 DIAGNOSIS — O99210 Obesity complicating pregnancy, unspecified trimester: Secondary | ICD-10-CM

## 2021-09-20 DIAGNOSIS — R7989 Other specified abnormal findings of blood chemistry: Secondary | ICD-10-CM

## 2021-09-20 DIAGNOSIS — J45909 Unspecified asthma, uncomplicated: Secondary | ICD-10-CM | POA: Insufficient documentation

## 2021-09-20 DIAGNOSIS — O24410 Gestational diabetes mellitus in pregnancy, diet controlled: Secondary | ICD-10-CM

## 2021-09-20 DIAGNOSIS — O409XX0 Polyhydramnios, unspecified trimester, not applicable or unspecified: Secondary | ICD-10-CM

## 2021-09-20 DIAGNOSIS — O09523 Supervision of elderly multigravida, third trimester: Secondary | ICD-10-CM | POA: Diagnosis not present

## 2021-09-20 DIAGNOSIS — D259 Leiomyoma of uterus, unspecified: Secondary | ICD-10-CM

## 2021-09-20 DIAGNOSIS — O99213 Obesity complicating pregnancy, third trimester: Secondary | ICD-10-CM

## 2021-09-20 DIAGNOSIS — O24419 Gestational diabetes mellitus in pregnancy, unspecified control: Secondary | ICD-10-CM | POA: Insufficient documentation

## 2021-09-20 DIAGNOSIS — O3663X0 Maternal care for excessive fetal growth, third trimester, not applicable or unspecified: Secondary | ICD-10-CM

## 2021-09-20 DIAGNOSIS — Z3A37 37 weeks gestation of pregnancy: Secondary | ICD-10-CM

## 2021-09-20 DIAGNOSIS — Z8616 Personal history of COVID-19: Secondary | ICD-10-CM | POA: Diagnosis present

## 2021-09-20 DIAGNOSIS — O3410 Maternal care for benign tumor of corpus uteri, unspecified trimester: Secondary | ICD-10-CM

## 2021-09-20 IMAGING — US US MFM FETAL BPP W/O NON-STRESS
1 series · 15 of 28 positions shown · non-contrast
Comparison: none

[Series 1: us mfm fetal bpp w/o non-stress · 35 acquisitions, 15 frames shown]
[im 1/35]
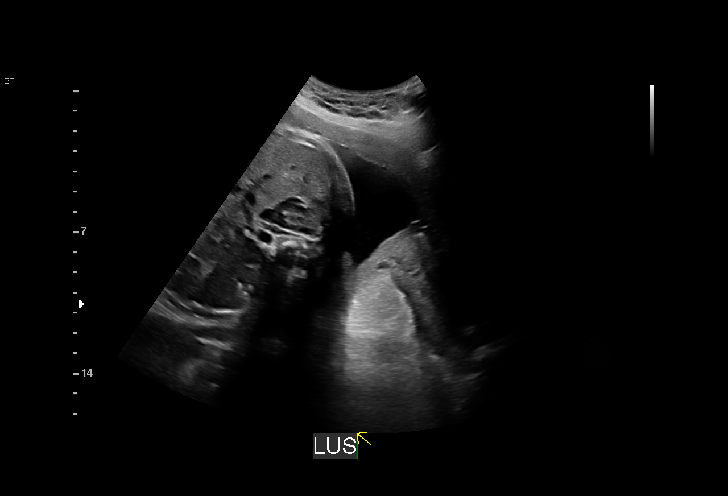
[im 3/35]
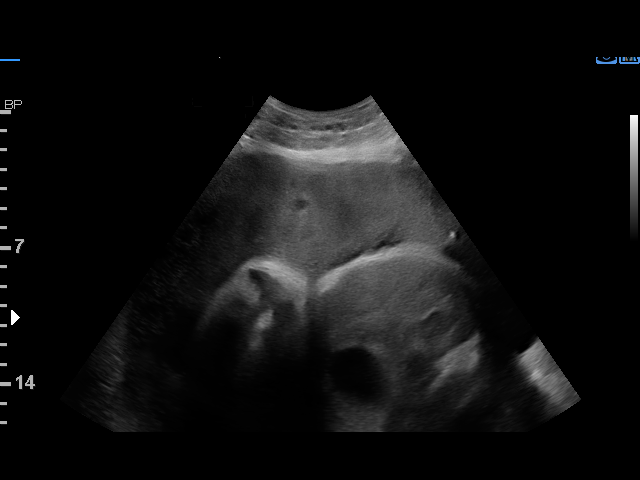
[im 6/35]
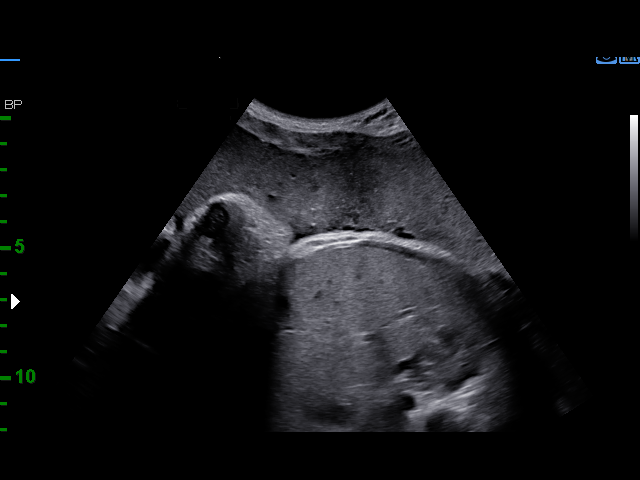
[im 8/35]
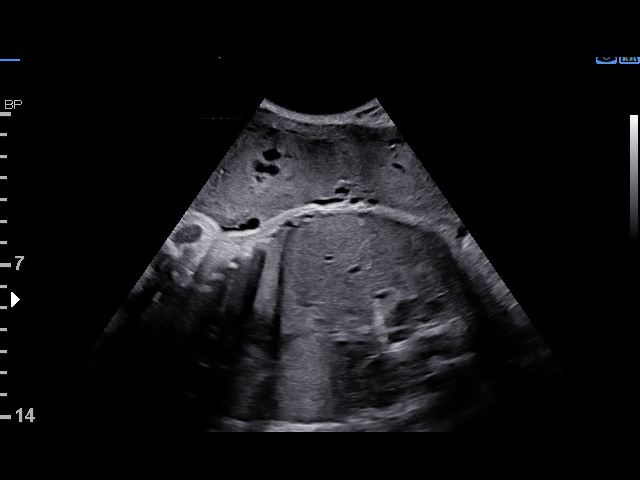
[im 11/35]
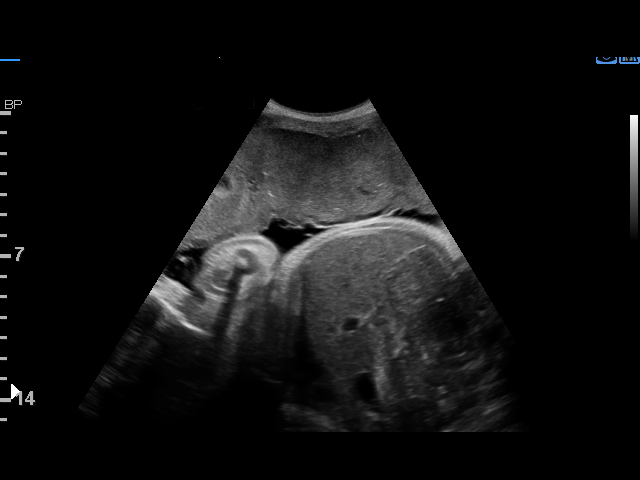
[im 13/35]
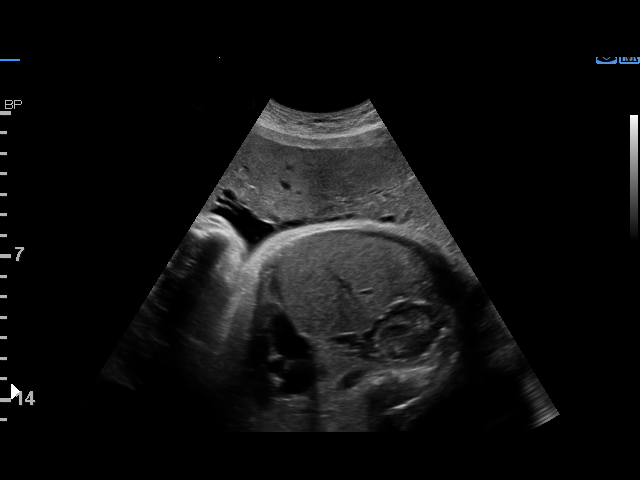
[im 16/35]
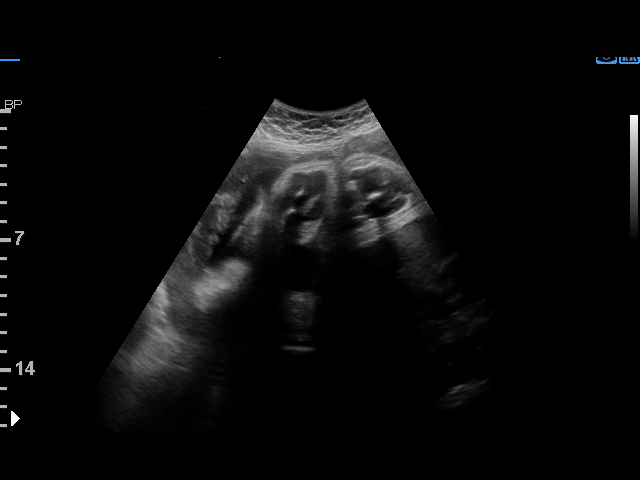
[im 18/35]
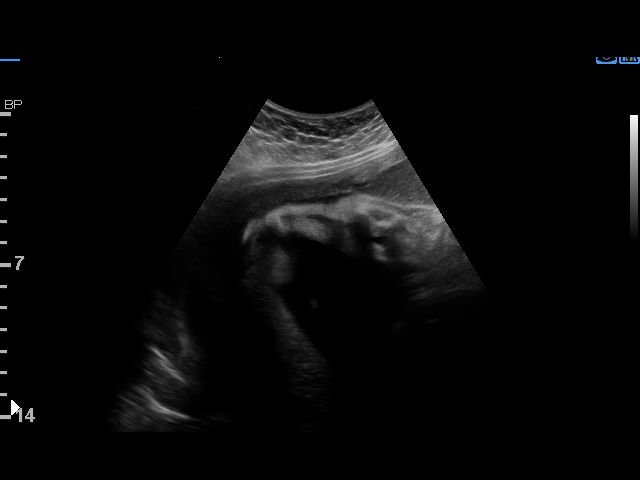
[im 19/35]
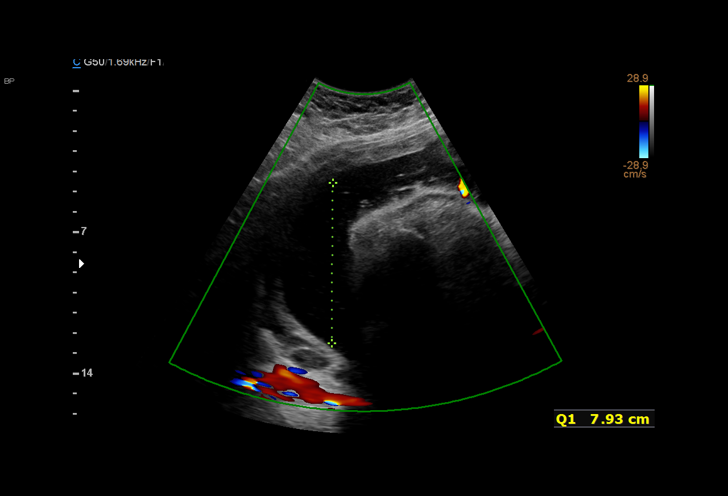
[im 22/35]
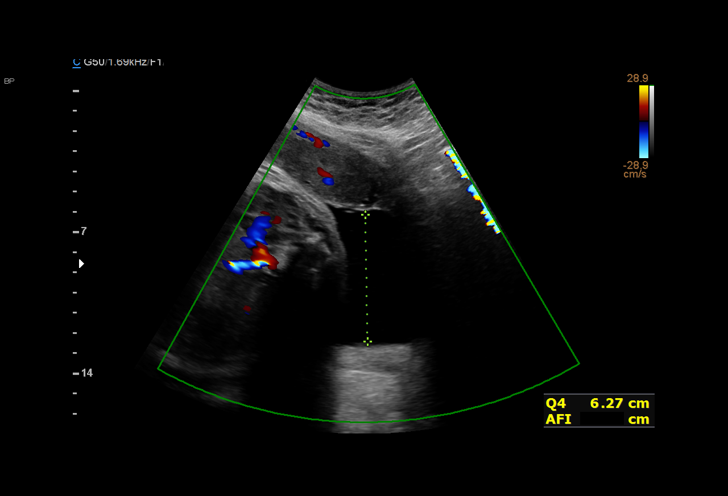
[im 24/35]
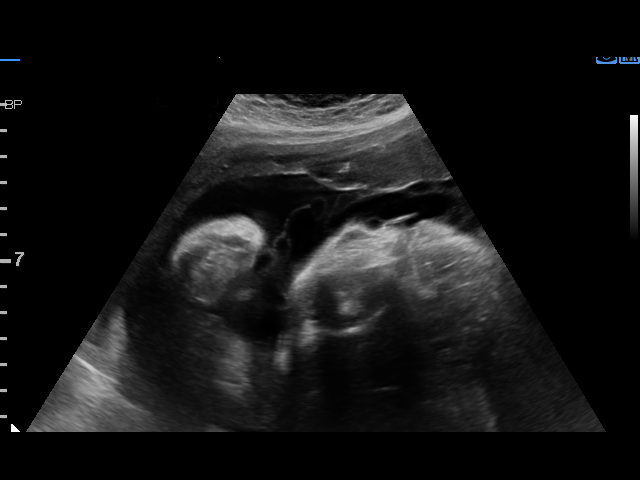
[im 27/35]
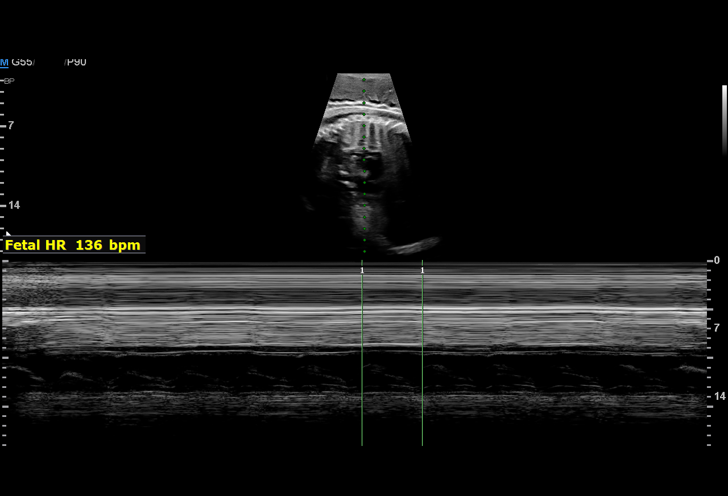
[im 29/35]
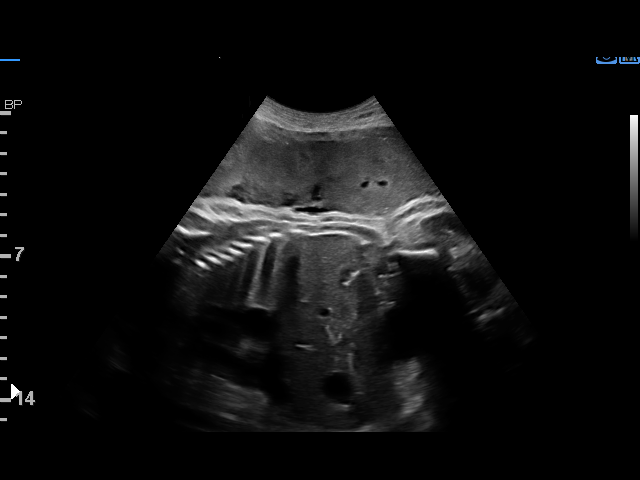
[im 32/35]
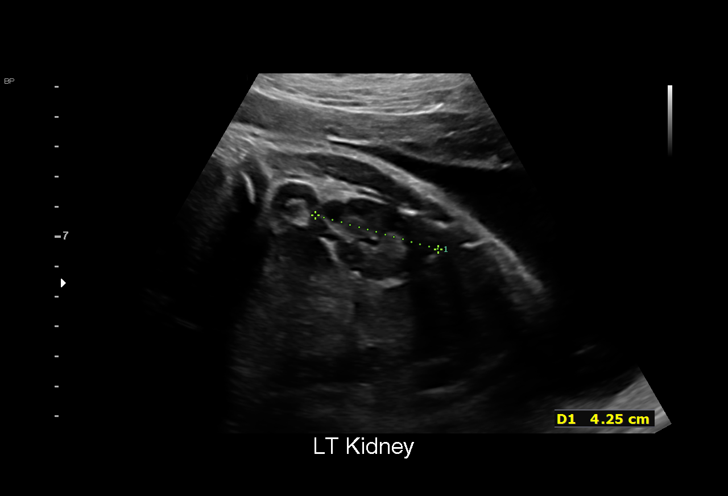
[im 35/35]
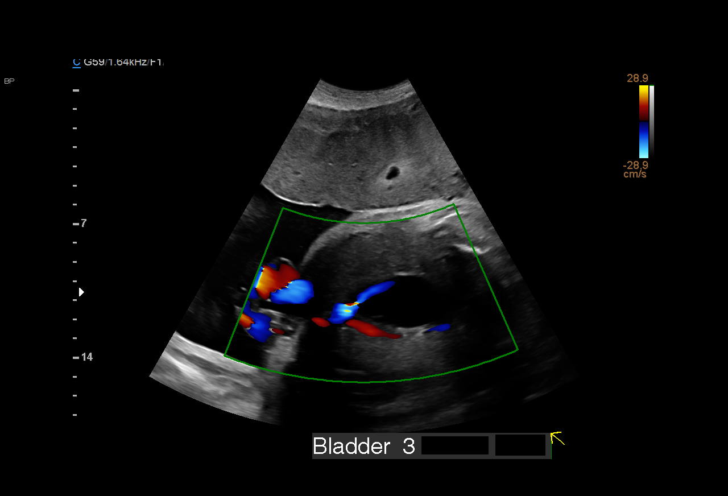

[15 of 28 positions shown; findings below may reference images not displayed]

Indications

 Obesity complicating pregnancy, third
 trimester BMI 45
 Gestational diabetes in pregnancy,
 unspecified control
 Advanced maternal age multigravida 35+,
 third trimester
 Uterine fibroids affecting pregnancy in third  O34.13,
 trimester, antepartum
 Genetic carrier (Kiri Jim)
 LR NIPS
 37 weeks gestation of pregnancy
Fetal Evaluation

 Num Of Fetuses:         1
 Fetal Heart Rate(bpm):  136
 Cardiac Activity:       Observed
 Presentation:           Transverse, head to maternal right
 Placenta:               Anterior
 P. Cord Insertion:      Previously Visualized

 Amniotic Fluid
 AFI FV:      Within normal limits

 AFI Sum(cm)     %Tile       Largest Pocket(cm)
 20.94           82

 RUQ(cm)       RLQ(cm)       LUQ(cm)        LLQ(cm)

Biophysical Evaluation

 Amniotic F.V:   Pocket => 2 cm             F. Tone:        Observed
 F. Movement:    Observed                   Score:          [DATE]
 F. Breathing:   Observed
Biometry

 LV:        5.6  mm
OB History

 Gravidity:    3         Term:   2        Prem:   0        SAB:   0
 TOP:          0       Ectopic:  0        Living: 2
Gestational Age

 LMP:           35w 5d        Date:  01/13/21                 EDD:   10/20/21
 Best:          37w 5d     Det. By:  U/S  (05/23/21)          EDD:   10/06/21
Anatomy

 Cranium:               Appears normal         Aortic Arch:            Previously seen
 Cavum:                 Previously seen        Ductal Arch:            Previously seen
 Ventricles:            Appears normal         Diaphragm:              Appears normal
 Choroid Plexus:        Previously seen        Stomach:                Appears normal, left
                                                                       sided
 Cerebellum:            Previously seen        Abdomen:                Previously seen
 Posterior Fossa:       Previously seen        Abdominal Wall:         Previously seen
 Nuchal Fold:           Not applicable (>20    Cord Vessels:           Previously seen
                        wks GA)
 Face:                  Orbits and profile     Kidneys:                Appear normal
                        previously seen
 Lips:                  Previously seen        Bladder:                Appears normal
 Thoracic:              Appears normal         Spine:                  Previously seen
 Heart:                 Previously seen        Upper Extremities:      Previously seen
 RVOT:                  Previously seen        Lower Extremities:      Previously seen
 LVOT:                  Previously seen

 Other:  3VT prev visualized. Technically difficult due to advanced gestational
         age.
Cervix Uterus Adnexa

 Cervix
 Not visualized (advanced GA >77wks)

 Uterus
 Normal shape and size.

 Right Ovary
 Not visualized.

 Left Ovary
 Not visualized.
Comments

 This patient was seen for a BPP due to maternal obesity and
 diet controlled gestational diabetes.  She denies any
 problems since her last exam.
 A biophysical profile performed today was [DATE].
 There was normal amniotic fluid noted today.
 The fetus was in the transverse/breech presentation today.
 She will return in 1 week for another BPP.
 She already has an induction of labor scheduled on [DATE]

## 2021-09-20 NOTE — Patient Instructions (Signed)

## 2021-09-20 NOTE — Progress Notes (Signed)
° °  Subjective:  Sandra Krueger is a 36 y.o. G3P2002 at [redacted]w[redacted]d being seen today for ongoing prenatal care.  She is currently monitored for the following issues for this high-risk pregnancy and has BMI 40.0-44.9, adult ([place]); Uterine fibroid in pregnancy; Supervision of high risk pregnancy, antepartum; AMA (advanced maternal age) multigravida 6+; History of COVID-19; Asthma affecting pregnancy in first trimester; Obesity in pregnancy; Low TSH level; Alpha thalassemia silent carrier; LGA (large for gestational age) fetus affecting management of mother; Polyhydramnios affecting pregnancy; and Gestational diabetes on their problem list.  Patient reports no complaints.  Contractions: Irritability. Vag. Bleeding: None.  Movement: Present. Denies leaking of fluid.   The following portions of the patient's history were reviewed and updated as appropriate: allergies, current medications, past family history, past medical history, past social history, past surgical history and problem list. Problem list updated.  Objective:   Vitals:   09/20/21 1418  BP: 108/77  Pulse: (!) 106  Weight: 250 lb (113.4 kg)    Fetal Status: Fetal Heart Rate (bpm): 129   Movement: Present     General:  Alert, oriented and cooperative. Patient is in no acute distress.  Skin: Skin is warm and dry. No rash noted.   Cardiovascular: Normal heart rate noted  Respiratory: Normal respiratory effort, no problems with respiration noted  Abdomen: Soft, gravid, appropriate for gestational age. Pain/Pressure: Present     Pelvic: Vag. Bleeding: None     Cervical exam deferred        Extremities: Normal range of motion.  Edema: Trace  Mental Status: Normal mood and affect. Normal behavior. Normal judgment and thought content.   Urinalysis:      Assessment and Plan:  Pregnancy: G3P2002 at [redacted]w[redacted]d  1. Supervision of high risk pregnancy, antepartum BP and FHR normal On BPP earlier today patient was transverse/breech Discussed  unstable lie, and that we can try a ECV at time of IOL if necessary Generally would offer vaginal breech delivery but infant is measuring LGA, 98% at last [place] and resolved polyhydramnios Patient understand cesarean may be indicated if fetus is breech and ECV is unsuccessful  2. Diet controlled gestational diabetes mellitus (GDM) in third trimester Reports good control Following w MFM for weekly BPP's Delivery already scheduled for 39 weeks  3. Uterine fibroid in pregnancy 5-6 cm intramural posterior  4. Excessive fetal growth affecting management of pregnancy in third trimester, single or unspecified fetus Last growth [place] 09/14/21, EFW 3801g, 98% Largest previous delivery 3487g  5. Polyhydramnios affecting pregnancy resolved  6. Low TSH level Normal TFTs on most recent check  7. Obesity in pregnancy   8. Multigravida of advanced maternal age in third trimester   45. Alpha thalassemia silent carrier   10. Asthma affecting pregnancy in first trimester   Term labor symptoms and general obstetric precautions including but not limited to vaginal bleeding, contractions, leaking of fluid and fetal movement were reviewed in detail with the patient. Please refer to After Visit Summary for other counseling recommendations.  Return in 1 week (on 09/27/2021) for [facility], ob visit, needs MD.   Clarnce Flock, MD

## 2021-09-21 ENCOUNTER — Encounter: Payer: Self-pay | Admitting: *Deleted

## 2021-09-27 ENCOUNTER — Other Ambulatory Visit: Payer: Self-pay | Admitting: Obstetrics

## 2021-09-27 ENCOUNTER — Ambulatory Visit: Payer: Medicaid Other | Admitting: *Deleted

## 2021-09-27 ENCOUNTER — Other Ambulatory Visit: Payer: Self-pay

## 2021-09-27 ENCOUNTER — Encounter: Payer: Self-pay | Admitting: *Deleted

## 2021-09-27 ENCOUNTER — Other Ambulatory Visit: Payer: Self-pay | Admitting: Family Medicine

## 2021-09-27 ENCOUNTER — Ambulatory Visit: Payer: Medicaid Other | Attending: Obstetrics

## 2021-09-27 ENCOUNTER — Encounter: Payer: Self-pay | Admitting: Family Medicine

## 2021-09-27 ENCOUNTER — Ambulatory Visit (INDEPENDENT_AMBULATORY_CARE_PROVIDER_SITE_OTHER): Payer: Medicaid Other | Admitting: Family Medicine

## 2021-09-27 VITALS — BP 113/71 | HR 86

## 2021-09-27 VITALS — BP 108/73 | HR 108 | Wt 252.4 lb

## 2021-09-27 DIAGNOSIS — O24410 Gestational diabetes mellitus in pregnancy, diet controlled: Secondary | ICD-10-CM

## 2021-09-27 DIAGNOSIS — Z3A38 38 weeks gestation of pregnancy: Secondary | ICD-10-CM

## 2021-09-27 DIAGNOSIS — O99213 Obesity complicating pregnancy, third trimester: Secondary | ICD-10-CM | POA: Diagnosis present

## 2021-09-27 DIAGNOSIS — D259 Leiomyoma of uterus, unspecified: Secondary | ICD-10-CM

## 2021-09-27 DIAGNOSIS — O0990 Supervision of high risk pregnancy, unspecified, unspecified trimester: Secondary | ICD-10-CM | POA: Insufficient documentation

## 2021-09-27 DIAGNOSIS — O99511 Diseases of the respiratory system complicating pregnancy, first trimester: Secondary | ICD-10-CM | POA: Diagnosis present

## 2021-09-27 DIAGNOSIS — O99210 Obesity complicating pregnancy, unspecified trimester: Secondary | ICD-10-CM

## 2021-09-27 DIAGNOSIS — Z8616 Personal history of COVID-19: Secondary | ICD-10-CM | POA: Diagnosis present

## 2021-09-27 DIAGNOSIS — O24415 Gestational diabetes mellitus in pregnancy, controlled by oral hypoglycemic drugs: Secondary | ICD-10-CM | POA: Diagnosis not present

## 2021-09-27 DIAGNOSIS — O3413 Maternal care for benign tumor of corpus uteri, third trimester: Secondary | ICD-10-CM

## 2021-09-27 DIAGNOSIS — O3663X0 Maternal care for excessive fetal growth, third trimester, not applicable or unspecified: Secondary | ICD-10-CM

## 2021-09-27 DIAGNOSIS — O24419 Gestational diabetes mellitus in pregnancy, unspecified control: Secondary | ICD-10-CM

## 2021-09-27 DIAGNOSIS — O409XX0 Polyhydramnios, unspecified trimester, not applicable or unspecified: Secondary | ICD-10-CM

## 2021-09-27 DIAGNOSIS — O09523 Supervision of elderly multigravida, third trimester: Secondary | ICD-10-CM

## 2021-09-27 DIAGNOSIS — J45909 Unspecified asthma, uncomplicated: Secondary | ICD-10-CM | POA: Diagnosis present

## 2021-09-27 IMAGING — US US MFM FETAL BPP W/ NON-STRESS
1 series · 15 of 17 positions shown · non-contrast
Comparison: none

[Series 1: us mfm fetal bpp w/ non-stress · 17 acquisitions, 15 frames shown]
[im 1/17]
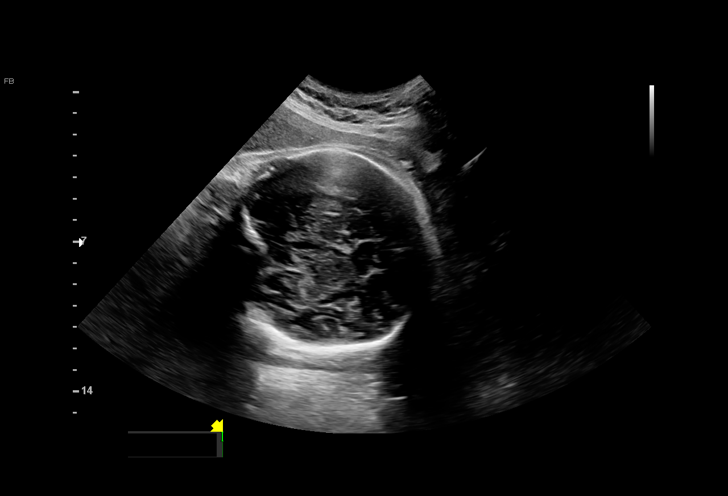
[im 2/17]
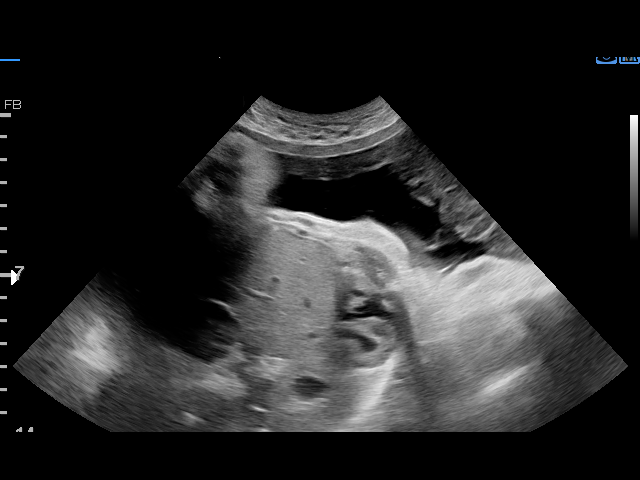
[im 3/17]
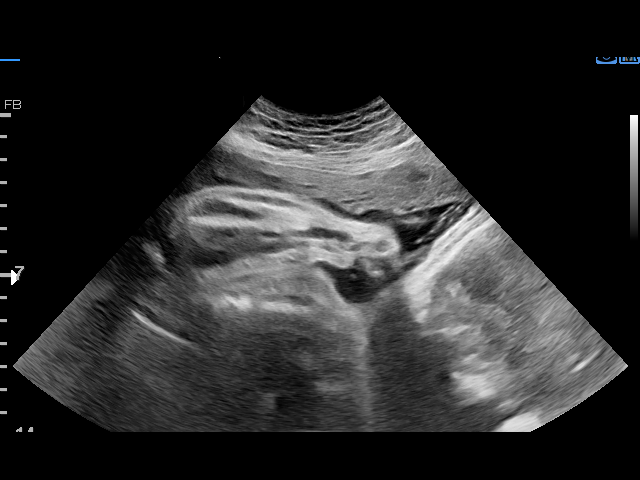
[im 4/17]
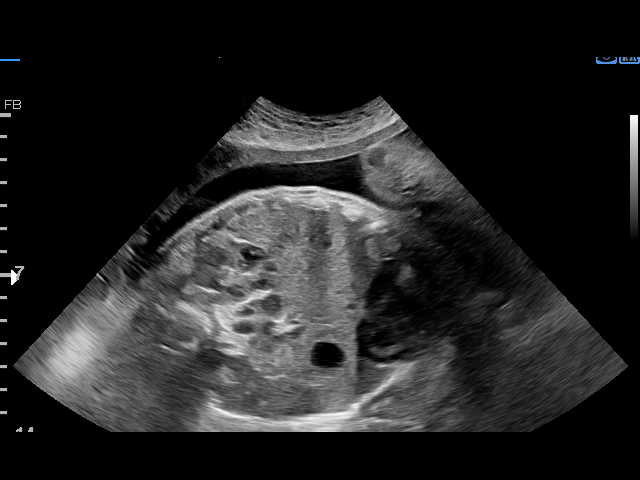
[im 6/17]
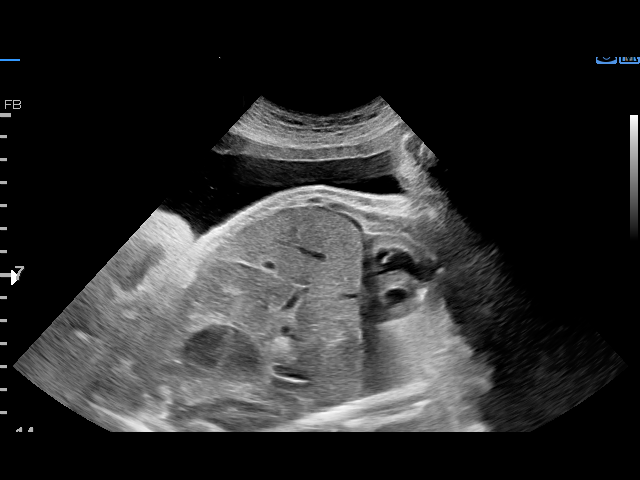
[im 7/17]
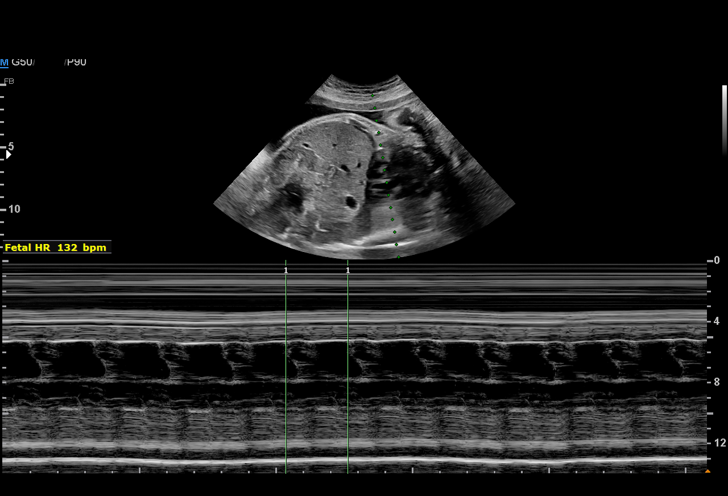
[im 8/17]
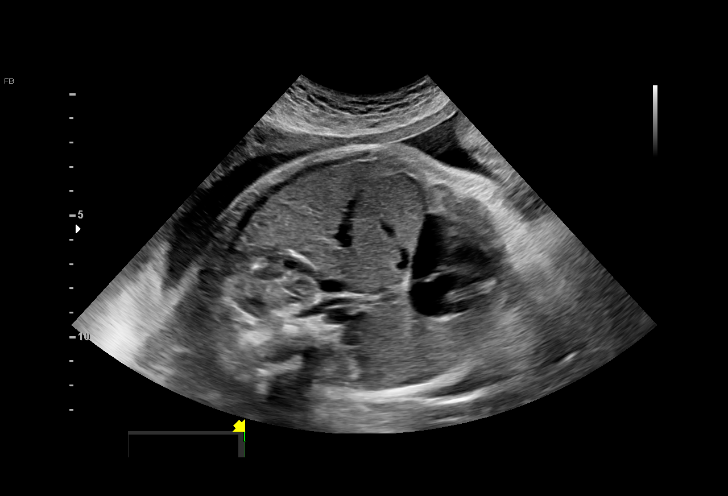
[im 9/17]
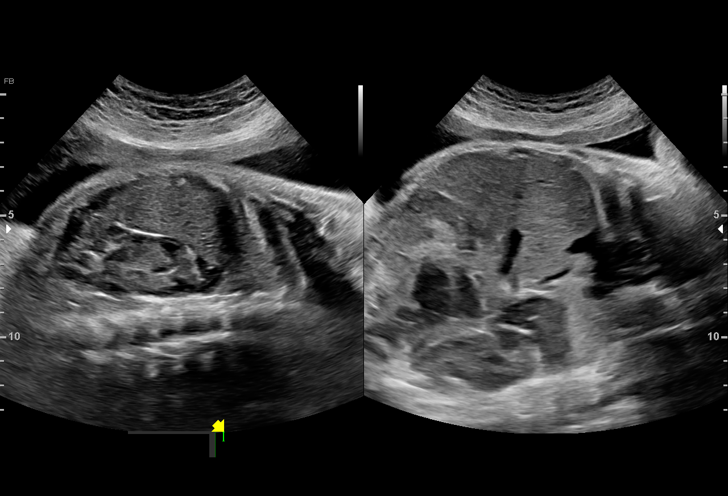
[im 10/17]
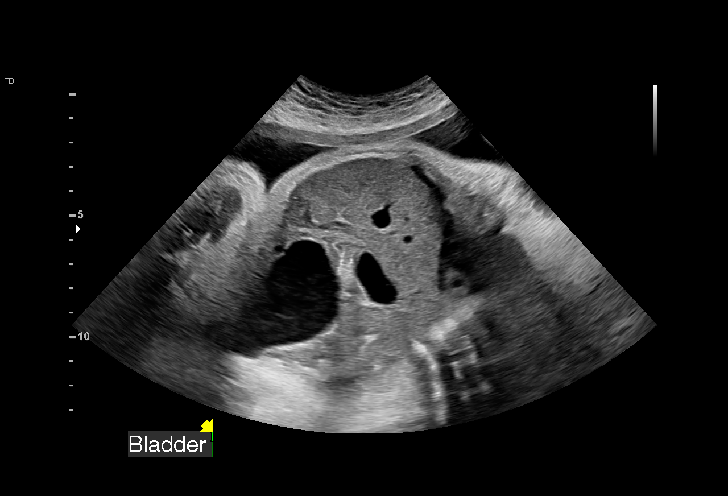
[im 11/17]
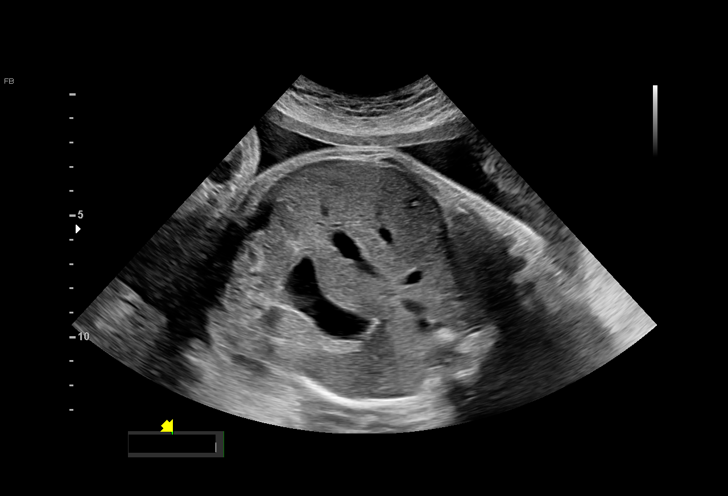
[im 12/17]
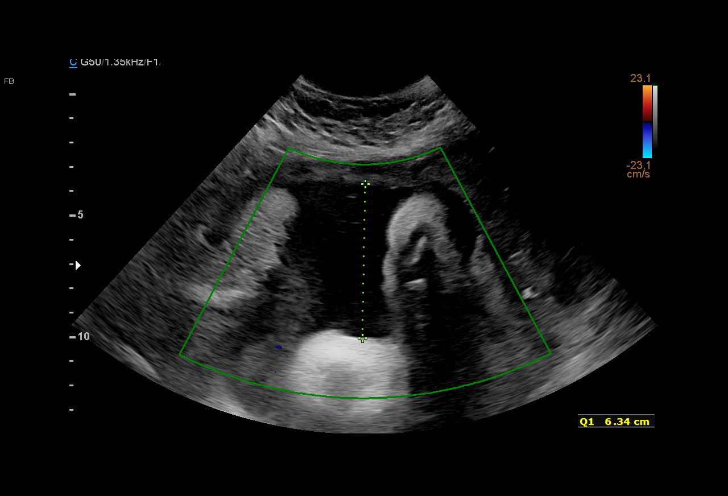
[im 14/17]
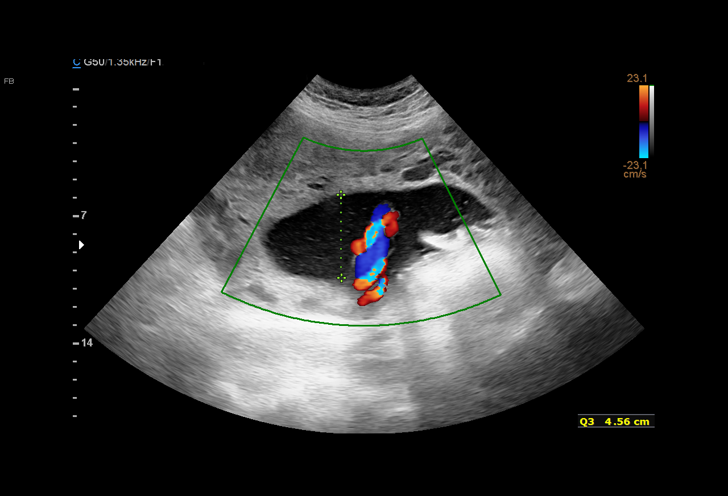
[im 15/17]
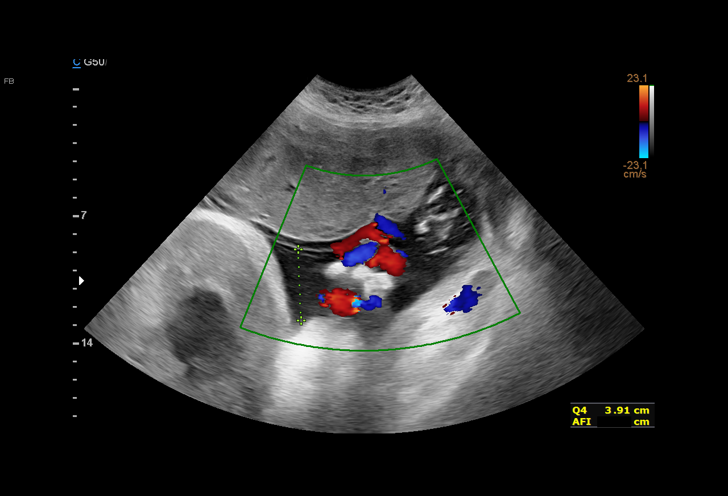
[im 16/17]
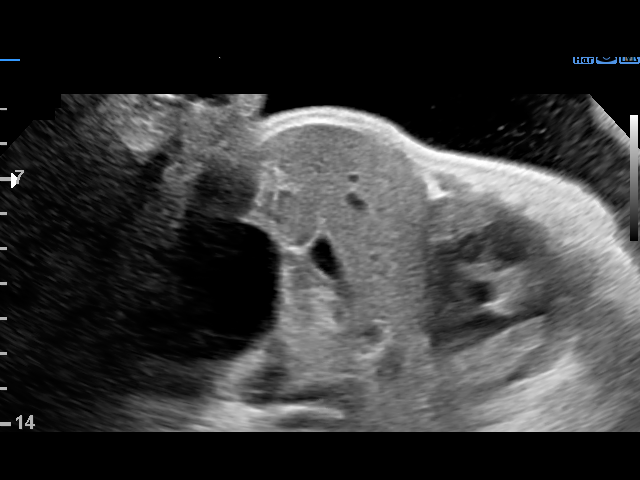
[im 17/17]
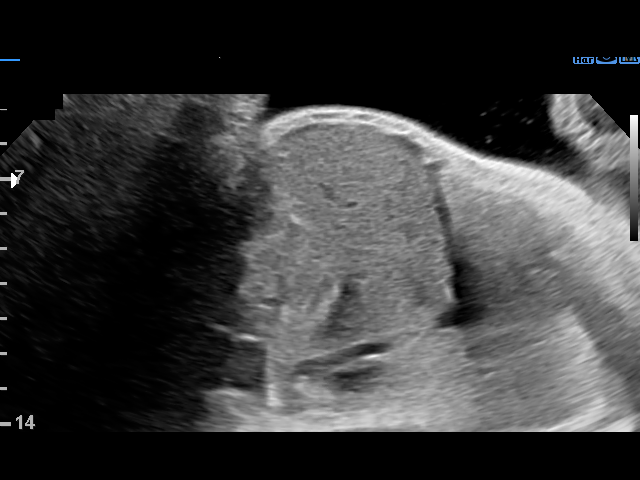

[15 of 17 positions shown; findings below may reference images not displayed]

W/NONSTRESS

Indications

 Obesity complicating pregnancy, third
 trimester BMI 45
 Gestational diabetes in pregnancy,
 controlled by oral hypoglycemic drugs
 38 weeks gestation of pregnancy
 Advanced maternal age multigravida 35+,
 third trimester
 Gestational diabetes in pregnancy, diet
 controlled
 Uterine fibroids affecting pregnancy in third  O34.13,
 trimester, antepartum
 Genetic carrier (Bert-Ove Gorgees)
 LR NIPS
Fetal Evaluation

 Num Of Fetuses:         1
 Fetal Heart Rate(bpm):  132
 Cardiac Activity:       Observed
 Presentation:           Cephalic
 Placenta:               Anterior
 P. Cord Insertion:      Previously Visualized

 Amniotic Fluid
 AFI FV:      Within normal limits

 AFI Sum(cm)     %Tile       Largest Pocket(cm)
 17.2            69
 RUQ(cm)       RLQ(cm)       LUQ(cm)        LLQ(cm)

Biophysical Evaluation

 Amniotic F.V:   Pocket => 2 cm             F. Tone:        Observed
 F. Movement:    Observed                   N.S.T:          Reactive
 F. Breathing:   Not Observed               Score:          [DATE]
OB History

 Gravidity:    3         Term:   2        Prem:   0        SAB:   0
 TOP:          0       Ectopic:  0        Living: 2
Gestational Age

 LMP:           36w 5d        Date:  01/13/21                 EDD:   10/20/21
 Best:          38w 5d     Det. By:  U/S  (05/23/21)          EDD:   10/06/21
Comments

 This patient was seen for a BPP due to maternal obesity and
 diet controlled gestational diabetes.  She denies any
 problems since her last exam.
 A biophysical profile performed today was [DATE].  She
 received a -2 for fetal breathing movements that did not meet
 criteria.  She subsequently had a reactive NST making her
 total biophysical profile score [DATE].
 There was normal amniotic fluid noted today.
 She is already scheduled for delivery in 2 days.

## 2021-09-27 NOTE — Progress Notes (Signed)
° °  Subjective:  Sandra Krueger is a 36 y.o. G3P2002 at [redacted]w[redacted]d being seen today for ongoing prenatal care.  She is currently monitored for the following issues for this high-risk pregnancy and has BMI 40.0-44.9, adult ([place]); Uterine fibroid in pregnancy; Supervision of high risk pregnancy, antepartum; AMA (advanced maternal age) multigravida 1+; History of COVID-19; Asthma affecting pregnancy in first trimester; Obesity in pregnancy; Low TSH level; Alpha thalassemia silent carrier; LGA (large for gestational age) fetus affecting management of mother; Polyhydramnios affecting pregnancy; and Gestational diabetes on their problem list.  Patient reports no complaints.  Contractions: Irritability. Vag. Bleeding: None.  Movement: Present. Denies leaking of fluid.   The following portions of the patient's history were reviewed and updated as appropriate: allergies, current medications, past family history, past medical history, past social history, past surgical history and problem list. Problem list updated.  Objective:   Vitals:   09/27/21 0951  BP: 108/73  Pulse: (!) 108  Weight: 252 lb 6.4 oz (114.5 kg)    Fetal Status: Fetal Heart Rate (bpm): 145   Movement: Present     General:  Alert, oriented and cooperative. Patient is in no acute distress.  Skin: Skin is warm and dry. No rash noted.   Cardiovascular: Normal heart rate noted  Respiratory: Normal respiratory effort, no problems with respiration noted  Abdomen: Soft, gravid, appropriate for gestational age. Pain/Pressure: Present     Pelvic: Vag. Bleeding: None     Cervical exam deferred        Extremities: Normal range of motion.     Mental Status: Normal mood and affect. Normal behavior. Normal judgment and thought content.   Urinalysis:      Assessment and Plan:  Pregnancy: G3P2002 at [redacted]w[redacted]d  1. Supervision of high risk pregnancy, antepartum BP and FHR normal  2. Diet controlled gestational diabetes mellitus (GDM) in third  trimester Not checking sugars Scheduled for IOL in two days Has BPP scheduled for later today  3. Multigravida of advanced maternal age in third trimester   4. Obesity in pregnancy   5. Excessive fetal growth affecting management of pregnancy in third trimester, single or unspecified fetus EFW 3801g, 98% on growth scan on 09/14/21  6. Polyhydramnios affecting pregnancy Resolved 21 cm on last scan  Preterm labor symptoms and general obstetric precautions including but not limited to vaginal bleeding, contractions, leaking of fluid and fetal movement were reviewed in detail with the patient. Please refer to After Visit Summary for other counseling recommendations.  Return in 7 weeks (on 11/15/2021) for PP check.   Clarnce Flock, MD

## 2021-09-27 NOTE — Procedures (Signed)
Sandra Krueger 12-Feb-1986 [redacted]w[redacted]d  Fetus A Non-Stress Test Interpretation for 09/27/21  Indication: Unsatisfactory BPP and AMA, obesity, GDM-diet  Fetal Heart Rate A Mode: External Baseline Rate (A): 140 bpm Variability: Moderate Accelerations: 15 x 15 Decelerations: None  Uterine Activity Mode: Palpation, Toco Contraction Frequency (min): Occas. w/UI Contraction Quality: Mild Resting Tone Palpated: Relaxed Resting Time: Adequate  Interpretation (Fetal Testing) Nonstress Test Interpretation: Reactive Comments: Dr. Annamaria Boots reviewed tracing.

## 2021-09-27 NOTE — Patient Instructions (Signed)

## 2021-09-28 LAB — SARS CORONAVIRUS 2 (TAT 6-24 HRS): SARS Coronavirus 2: NEGATIVE

## 2021-09-29 ENCOUNTER — Encounter (HOSPITAL_COMMUNITY): Payer: Self-pay | Admitting: Family Medicine

## 2021-09-29 ENCOUNTER — Inpatient Hospital Stay (HOSPITAL_COMMUNITY): Payer: Medicaid Other

## 2021-09-29 ENCOUNTER — Inpatient Hospital Stay (HOSPITAL_COMMUNITY)
Admission: AD | Admit: 2021-09-29 | Discharge: 2021-10-02 | DRG: 787 | Disposition: A | Discharge status: Home / Self Care | Payer: Medicaid Other | Attending: Obstetrics and Gynecology | Admitting: Obstetrics and Gynecology

## 2021-09-29 ENCOUNTER — Encounter (HOSPITAL_COMMUNITY)
Admission: AD | Disposition: A | Discharge status: Home / Self Care | Payer: Self-pay | Source: Home / Self Care | Attending: Obstetrics and Gynecology

## 2021-09-29 ENCOUNTER — Inpatient Hospital Stay (HOSPITAL_COMMUNITY): Payer: Medicaid Other | Admitting: Anesthesiology

## 2021-09-29 DIAGNOSIS — J45909 Unspecified asthma, uncomplicated: Secondary | ICD-10-CM | POA: Diagnosis present

## 2021-09-29 DIAGNOSIS — O321XX0 Maternal care for breech presentation, not applicable or unspecified: Secondary | ICD-10-CM

## 2021-09-29 DIAGNOSIS — O99214 Obesity complicating childbirth: Secondary | ICD-10-CM | POA: Diagnosis present

## 2021-09-29 DIAGNOSIS — O24420 Gestational diabetes mellitus in childbirth, diet controlled: Secondary | ICD-10-CM | POA: Diagnosis present

## 2021-09-29 DIAGNOSIS — O9081 Anemia of the puerperium: Secondary | ICD-10-CM | POA: Diagnosis not present

## 2021-09-29 DIAGNOSIS — D252 Subserosal leiomyoma of uterus: Secondary | ICD-10-CM | POA: Diagnosis present

## 2021-09-29 DIAGNOSIS — D563 Thalassemia minor: Secondary | ICD-10-CM | POA: Diagnosis present

## 2021-09-29 DIAGNOSIS — Z3A39 39 weeks gestation of pregnancy: Secondary | ICD-10-CM

## 2021-09-29 DIAGNOSIS — O3663X0 Maternal care for excessive fetal growth, third trimester, not applicable or unspecified: Secondary | ICD-10-CM | POA: Diagnosis present

## 2021-09-29 DIAGNOSIS — O321XX1 Maternal care for breech presentation, fetus 1: Secondary | ICD-10-CM | POA: Diagnosis not present

## 2021-09-29 DIAGNOSIS — Z7982 Long term (current) use of aspirin: Secondary | ICD-10-CM | POA: Diagnosis not present

## 2021-09-29 DIAGNOSIS — O09523 Supervision of elderly multigravida, third trimester: Secondary | ICD-10-CM

## 2021-09-29 DIAGNOSIS — O99210 Obesity complicating pregnancy, unspecified trimester: Secondary | ICD-10-CM

## 2021-09-29 DIAGNOSIS — O3413 Maternal care for benign tumor of corpus uteri, third trimester: Secondary | ICD-10-CM | POA: Diagnosis present

## 2021-09-29 DIAGNOSIS — Z8616 Personal history of COVID-19: Secondary | ICD-10-CM

## 2021-09-29 DIAGNOSIS — O24429 Gestational diabetes mellitus in childbirth, unspecified control: Secondary | ICD-10-CM | POA: Diagnosis not present

## 2021-09-29 DIAGNOSIS — O09529 Supervision of elderly multigravida, unspecified trimester: Secondary | ICD-10-CM

## 2021-09-29 DIAGNOSIS — O9952 Diseases of the respiratory system complicating childbirth: Secondary | ICD-10-CM | POA: Diagnosis present

## 2021-09-29 DIAGNOSIS — O3660X0 Maternal care for excessive fetal growth, unspecified trimester, not applicable or unspecified: Secondary | ICD-10-CM | POA: Diagnosis present

## 2021-09-29 DIAGNOSIS — D62 Acute posthemorrhagic anemia: Secondary | ICD-10-CM | POA: Diagnosis not present

## 2021-09-29 DIAGNOSIS — D259 Leiomyoma of uterus, unspecified: Secondary | ICD-10-CM | POA: Diagnosis present

## 2021-09-29 DIAGNOSIS — Z98891 History of uterine scar from previous surgery: Secondary | ICD-10-CM

## 2021-09-29 DIAGNOSIS — O24419 Gestational diabetes mellitus in pregnancy, unspecified control: Secondary | ICD-10-CM | POA: Diagnosis present

## 2021-09-29 DIAGNOSIS — O3410 Maternal care for benign tumor of corpus uteri, unspecified trimester: Secondary | ICD-10-CM | POA: Diagnosis present

## 2021-09-29 DIAGNOSIS — O0990 Supervision of high risk pregnancy, unspecified, unspecified trimester: Secondary | ICD-10-CM

## 2021-09-29 DIAGNOSIS — O24410 Gestational diabetes mellitus in pregnancy, diet controlled: Secondary | ICD-10-CM

## 2021-09-29 LAB — COMPREHENSIVE METABOLIC PANEL
ALT: 10 U/L (ref 0–44)
AST: 16 U/L (ref 15–41)
Albumin: 2.8 g/dL — ABNORMAL LOW (ref 3.5–5.0)
Alkaline Phosphatase: 185 U/L — ABNORMAL HIGH (ref 38–126)
Anion gap: 9 (ref 5–15)
BUN: 5 mg/dL — ABNORMAL LOW (ref 6–20)
CO2: 20 mmol/L — ABNORMAL LOW (ref 22–32)
Calcium: 8.9 mg/dL (ref 8.9–10.3)
Chloride: 107 mmol/L (ref 98–111)
Creatinine, Ser: 0.55 mg/dL (ref 0.44–1.00)
GFR, Estimated: >60 mL/min (ref >60)
Glucose, Bld: 95 mg/dL (ref 70–99)
Potassium: 3.2 mmol/L — ABNORMAL LOW (ref 3.5–5.1)
Sodium: 136 mmol/L (ref 135–145)
Total Bilirubin: 0.2 mg/dL — ABNORMAL LOW (ref 0.3–1.2)
Total Protein: 6.4 g/dL — ABNORMAL LOW (ref 6.5–8.1)

## 2021-09-29 LAB — TYPE AND SCREEN
ABO/RH(D): O POS
Antibody Screen: NEGATIVE

## 2021-09-29 LAB — CBC
HCT: 33.7 % — ABNORMAL LOW (ref 36.0–46.0)
Hemoglobin: 10.5 g/dL — ABNORMAL LOW (ref 12.0–15.0)
MCH: 25.1 pg — ABNORMAL LOW (ref 26.0–34.0)
MCHC: 31.2 g/dL (ref 30.0–36.0)
MCV: 80.6 fL (ref 80.0–100.0)
Platelets: 191 10*3/uL (ref 150–400)
RBC: 4.18 MIL/uL (ref 3.87–5.11)
RDW: 16.8 % — ABNORMAL HIGH (ref 11.5–15.5)
WBC: 6.1 10*3/uL (ref 4.0–10.5)
nRBC: 0 % (ref 0.0–0.2)

## 2021-09-29 LAB — PROTEIN / CREATININE RATIO, URINE
Creatinine, Urine: 47.55 mg/dL
Protein Creatinine Ratio: 0.25 mg/mg{Cre} — ABNORMAL HIGH (ref 0.00–0.15)
Total Protein, Urine: 12 mg/dL

## 2021-09-29 LAB — GLUCOSE, CAPILLARY
Glucose-Capillary: 100 mg/dL — ABNORMAL HIGH (ref 70–99)
Glucose-Capillary: 95 mg/dL (ref 70–99)

## 2021-09-29 SURGERY — Surgical Case
Anesthesia: Spinal

## 2021-09-29 MED ORDER — MEPERIDINE HCL 25 MG/ML IJ SOLN: 6.2500 mg | INTRAMUSCULAR | Status: DC | PRN

## 2021-09-29 MED ORDER — TERBUTALINE SULFATE 1 MG/ML IJ SOLN
0.2500 mg | Freq: Once | INTRAMUSCULAR | Status: AC
  Administered 2021-09-29: 0.25 mg via SUBCUTANEOUS
  Filled 2021-09-29: qty 1

## 2021-09-29 MED ORDER — LACTATED RINGERS IV SOLN: INTRAVENOUS | Status: DC

## 2021-09-29 MED ORDER — ONDANSETRON HCL 4 MG/2ML IJ SOLN: 4.0000 mg | Freq: Three times a day (TID) | INTRAMUSCULAR | Status: DC | PRN

## 2021-09-29 MED ORDER — ONDANSETRON HCL 4 MG/2ML IJ SOLN
INTRAMUSCULAR | Status: DC | PRN
  Administered 2021-09-29: 4 mg via INTRAVENOUS

## 2021-09-29 MED ORDER — PROMETHAZINE HCL 25 MG/ML IJ SOLN: 6.2500 mg | INTRAMUSCULAR | Status: DC | PRN

## 2021-09-29 MED ORDER — KETOROLAC TROMETHAMINE 30 MG/ML IJ SOLN
INTRAMUSCULAR | Status: AC
  Filled 2021-09-29: qty 1

## 2021-09-29 MED ORDER — OXYCODONE HCL 5 MG PO TABS: 5.0000 mg | ORAL_TABLET | Freq: Four times a day (QID) | ORAL | Status: DC | PRN

## 2021-09-29 MED ORDER — NALOXONE HCL 0.4 MG/ML IJ SOLN: 0.4000 mg | INTRAMUSCULAR | Status: DC | PRN

## 2021-09-29 MED ORDER — PHENYLEPHRINE HCL-NACL 20-0.9 MG/250ML-% IV SOLN
INTRAVENOUS | Status: AC
  Filled 2021-09-29: qty 500

## 2021-09-29 MED ORDER — ACETAMINOPHEN 10 MG/ML IV SOLN
INTRAVENOUS | Status: AC
  Filled 2021-09-29: qty 100

## 2021-09-29 MED ORDER — LIDOCAINE HCL (PF) 1 % IJ SOLN: 30.0000 mL | INTRAMUSCULAR | Status: DC | PRN

## 2021-09-29 MED ORDER — OXYCODONE HCL 5 MG PO TABS: 5.0000 mg | ORAL_TABLET | Freq: Once | ORAL | Status: DC | PRN

## 2021-09-29 MED ORDER — KETOROLAC TROMETHAMINE 30 MG/ML IJ SOLN
30.0000 mg | Freq: Four times a day (QID) | INTRAMUSCULAR | Status: DC | PRN
  Administered 2021-09-29: 30 mg via INTRAVENOUS

## 2021-09-29 MED ORDER — ACETAMINOPHEN 500 MG PO TABS
ORAL_TABLET | ORAL | Status: AC
  Filled 2021-09-29: qty 2

## 2021-09-29 MED ORDER — MORPHINE SULFATE (PF) 0.5 MG/ML IJ SOLN
INTRAMUSCULAR | Status: DC | PRN
  Administered 2021-09-29: 150 ug via INTRATHECAL

## 2021-09-29 MED ORDER — DROPERIDOL 2.5 MG/ML IJ SOLN: 0.6250 mg | Freq: Once | INTRAMUSCULAR | Status: DC | PRN

## 2021-09-29 MED ORDER — FENTANYL CITRATE (PF) 100 MCG/2ML IJ SOLN
INTRAMUSCULAR | Status: DC | PRN
Start: 2021-09-29 — End: 2021-09-29
  Administered 2021-09-29: 15 ug via INTRATHECAL

## 2021-09-29 MED ORDER — LACTATED RINGERS IV SOLN: INTRAVENOUS | Status: DC | PRN

## 2021-09-29 MED ORDER — KETOROLAC TROMETHAMINE 30 MG/ML IJ SOLN: 30.0000 mg | Freq: Four times a day (QID) | INTRAMUSCULAR | Status: DC | PRN

## 2021-09-29 MED ORDER — OXYTOCIN-SODIUM CHLORIDE 30-0.9 UT/500ML-% IV SOLN
INTRAVENOUS | Status: AC
  Filled 2021-09-29: qty 500

## 2021-09-29 MED ORDER — FENTANYL CITRATE (PF) 100 MCG/2ML IJ SOLN: 25.0000 ug | INTRAMUSCULAR | Status: DC | PRN

## 2021-09-29 MED ORDER — EPHEDRINE 5 MG/ML INJ
INTRAVENOUS | Status: AC
  Filled 2021-09-29: qty 5

## 2021-09-29 MED ORDER — SOD CITRATE-CITRIC ACID 500-334 MG/5ML PO SOLN: 30.0000 mL | ORAL | Status: DC | PRN

## 2021-09-29 MED ORDER — SODIUM CHLORIDE 0.9 % IV SOLN
500.0000 mg | Freq: Once | INTRAVENOUS | Status: AC
  Administered 2021-09-29 (×2): 500 mg via INTRAVENOUS
  Filled 2021-09-29: qty 5

## 2021-09-29 MED ORDER — FENTANYL CITRATE (PF) 100 MCG/2ML IJ SOLN
INTRAMUSCULAR | Status: AC
  Filled 2021-09-29: qty 2

## 2021-09-29 MED ORDER — OXYCODONE HCL 5 MG/5ML PO SOLN: 5.0000 mg | Freq: Once | ORAL | Status: DC | PRN

## 2021-09-29 MED ORDER — ONDANSETRON HCL 4 MG/2ML IJ SOLN: 4.0000 mg | Freq: Four times a day (QID) | INTRAMUSCULAR | Status: DC | PRN

## 2021-09-29 MED ORDER — ACETAMINOPHEN 500 MG PO TABS
1000.0000 mg | ORAL_TABLET | Freq: Four times a day (QID) | ORAL | Status: DC
  Administered 2021-09-29: 1000 mg via ORAL

## 2021-09-29 MED ORDER — SODIUM CHLORIDE 0.9% FLUSH: 3.0000 mL | INTRAVENOUS | Status: DC | PRN

## 2021-09-29 MED ORDER — SOD CITRATE-CITRIC ACID 500-334 MG/5ML PO SOLN
30.0000 mL | ORAL | Status: AC
  Administered 2021-09-29: 30 mL via ORAL
  Filled 2021-09-29: qty 30

## 2021-09-29 MED ORDER — MORPHINE SULFATE (PF) 0.5 MG/ML IJ SOLN
INTRAMUSCULAR | Status: AC
  Filled 2021-09-29: qty 10

## 2021-09-29 MED ORDER — TERBUTALINE SULFATE 1 MG/ML IJ SOLN
0.2500 mg | Freq: Once | INTRAMUSCULAR | Status: AC | PRN
  Administered 2021-09-29: 0.25 mg via SUBCUTANEOUS
  Filled 2021-09-29: qty 1

## 2021-09-29 MED ORDER — CEFAZOLIN SODIUM-DEXTROSE 2-4 GM/100ML-% IV SOLN
2.0000 g | INTRAVENOUS | Status: AC
  Administered 2021-09-29: 2 g via INTRAVENOUS

## 2021-09-29 MED ORDER — PHENYLEPHRINE HCL-NACL 20-0.9 MG/250ML-% IV SOLN
INTRAVENOUS | Status: AC
Start: 2021-09-29 — End: ?
  Filled 2021-09-29: qty 250

## 2021-09-29 MED ORDER — OXYTOCIN-SODIUM CHLORIDE 30-0.9 UT/500ML-% IV SOLN: 1.0000 m[IU]/min | INTRAVENOUS | Status: DC

## 2021-09-29 MED ORDER — NALOXONE HCL 4 MG/10ML IJ SOLN
1.0000 ug/kg/h | INTRAVENOUS | Status: DC | PRN
  Filled 2021-09-29: qty 5

## 2021-09-29 MED ORDER — DIPHENHYDRAMINE HCL 50 MG/ML IJ SOLN: 12.5000 mg | INTRAMUSCULAR | Status: DC | PRN

## 2021-09-29 MED ORDER — BUPIVACAINE IN DEXTROSE 0.75-8.25 % IT SOLN
INTRATHECAL | Status: DC | PRN
  Administered 2021-09-29: 1.6 mL via INTRATHECAL

## 2021-09-29 MED ORDER — EPHEDRINE SULFATE-NACL 50-0.9 MG/10ML-% IV SOSY
PREFILLED_SYRINGE | INTRAVENOUS | Status: DC | PRN
  Administered 2021-09-29 (×2): 10 mg via INTRAVENOUS

## 2021-09-29 MED ORDER — OXYTOCIN-SODIUM CHLORIDE 30-0.9 UT/500ML-% IV SOLN: 2.5000 [IU]/h | INTRAVENOUS | Status: DC

## 2021-09-29 MED ORDER — MISOPROSTOL 50MCG HALF TABLET
50.0000 ug | ORAL_TABLET | ORAL | Status: DC | PRN
  Administered 2021-09-29: 50 ug via BUCCAL
  Filled 2021-09-29: qty 1

## 2021-09-29 MED ORDER — LACTATED RINGERS IV SOLN: 500.0000 mL | INTRAVENOUS | Status: DC | PRN

## 2021-09-29 MED ORDER — DIPHENHYDRAMINE HCL 25 MG PO CAPS: 25.0000 mg | ORAL_CAPSULE | ORAL | Status: DC | PRN

## 2021-09-29 MED ORDER — CEFAZOLIN SODIUM-DEXTROSE 2-4 GM/100ML-% IV SOLN
INTRAVENOUS | Status: AC
  Filled 2021-09-29: qty 100

## 2021-09-29 MED ORDER — OXYTOCIN BOLUS FROM INFUSION: 333.0000 mL | Freq: Once | INTRAVENOUS | Status: DC

## 2021-09-29 MED ORDER — PHENYLEPHRINE HCL-NACL 20-0.9 MG/250ML-% IV SOLN
INTRAVENOUS | Status: DC | PRN
  Administered 2021-09-29: 60 ug/min via INTRAVENOUS

## 2021-09-29 MED ORDER — FENTANYL CITRATE (PF) 100 MCG/2ML IJ SOLN: 100.0000 ug | INTRAMUSCULAR | Status: DC | PRN

## 2021-09-29 MED ORDER — OXYTOCIN-SODIUM CHLORIDE 30-0.9 UT/500ML-% IV SOLN
INTRAVENOUS | Status: DC | PRN
Start: 2021-09-29 — End: 2021-09-29
  Administered 2021-09-29: 300 mL via INTRAVENOUS

## 2021-09-29 MED ORDER — SODIUM CHLORIDE 0.9 % IV SOLN
INTRAVENOUS | Status: AC
  Filled 2021-09-29: qty 5

## 2021-09-29 MED ORDER — ONDANSETRON HCL 4 MG/2ML IJ SOLN
INTRAMUSCULAR | Status: AC
  Filled 2021-09-29: qty 2

## 2021-09-29 MED ORDER — ACETAMINOPHEN 325 MG PO TABS
650.0000 mg | ORAL_TABLET | ORAL | Status: DC | PRN
Start: 2021-09-29 — End: 2021-09-29

## 2021-09-29 SURGICAL SUPPLY — 40 items
BENZOIN TINCTURE PRP APPL 2/3 (GAUZE/BANDAGES/DRESSINGS) ×2 IMPLANT
CANISTER SUCT 3000ML PPV (MISCELLANEOUS) ×2 IMPLANT
CHLORAPREP W/TINT 26ML (MISCELLANEOUS) ×2 IMPLANT
CLOSURE STERI STRIP 1/2 X4 (GAUZE/BANDAGES/DRESSINGS) ×1 IMPLANT
DERMABOND ADVANCED (GAUZE/BANDAGES/DRESSINGS) ×1
DERMABOND ADVANCED .7 DNX12 (GAUZE/BANDAGES/DRESSINGS) IMPLANT
DRSG OPSITE POSTOP 4X10 (GAUZE/BANDAGES/DRESSINGS) ×2 IMPLANT
ELECT REM PT RETURN 9FT ADLT (ELECTROSURGICAL) ×2
ELECTRODE REM PT RTRN 9FT ADLT (ELECTROSURGICAL) ×1 IMPLANT
EXTRACTOR VACUUM KIWI (MISCELLANEOUS) ×2 IMPLANT
GLOVE BIOGEL PI IND STRL 7.0 (GLOVE) ×2 IMPLANT
GLOVE BIOGEL PI IND STRL 7.5 (GLOVE) ×1 IMPLANT
GLOVE BIOGEL PI INDICATOR 7.0 (GLOVE) ×2
GLOVE BIOGEL PI INDICATOR 7.5 (GLOVE) ×1
GLOVE SKINSENSE NS SZ7.0 (GLOVE) ×1
GLOVE SKINSENSE STRL SZ7.0 (GLOVE) ×1 IMPLANT
GOWN STRL REUS W/ TWL LRG LVL3 (GOWN DISPOSABLE) ×2 IMPLANT
GOWN STRL REUS W/ TWL XL LVL3 (GOWN DISPOSABLE) ×1 IMPLANT
GOWN STRL REUS W/TWL LRG LVL3 (GOWN DISPOSABLE) ×4
GOWN STRL REUS W/TWL XL LVL3 (GOWN DISPOSABLE) ×2
NS IRRIG 1000ML POUR BTL (IV SOLUTION) ×2 IMPLANT
PACK C SECTION WH (CUSTOM PROCEDURE TRAY) ×2 IMPLANT
PAD ABD 7.5X8 STRL (GAUZE/BANDAGES/DRESSINGS) ×2 IMPLANT
PAD OB MATERNITY 4.3X12.25 (PERSONAL CARE ITEMS) ×2 IMPLANT
PAD PREP 24X48 CUFFED NSTRL (MISCELLANEOUS) ×2 IMPLANT
PENCIL SMOKE EVAC W/HOLSTER (ELECTROSURGICAL) ×2 IMPLANT
RETRACTOR WND ALEXIS 25 LRG (MISCELLANEOUS) IMPLANT
RTRCTR WOUND ALEXIS 25CM LRG (MISCELLANEOUS) ×2
STRIP CLOSURE SKIN 1/2X4 (GAUZE/BANDAGES/DRESSINGS) ×2 IMPLANT
SUT MNCRL 0 VIOLET CTX 36 (SUTURE) ×2 IMPLANT
SUT MON AB 4-0 PS1 27 (SUTURE) ×2 IMPLANT
SUT MONOCRYL 0 CTX 36 (SUTURE) ×4
SUT PLAIN 2 0 XLH (SUTURE) ×2 IMPLANT
SUT VIC AB 0 CT1 36 (SUTURE) ×4 IMPLANT
SUT VIC AB 3-0 CT1 27 (SUTURE) ×2
SUT VIC AB 3-0 CT1 TAPERPNT 27 (SUTURE) ×1 IMPLANT
SUT VIC AB 4-0 KS 27 (SUTURE) ×1 IMPLANT
SYR 3ML 23GX1 SAFETY (SYRINGE) ×1 IMPLANT
TOWEL OR 17X24 6PK STRL BLUE (TOWEL DISPOSABLE) ×4 IMPLANT
WATER STERILE IRR 1000ML POUR (IV SOLUTION) ×3 IMPLANT

## 2021-09-29 NOTE — Procedures (Signed)
Patient is here for iol for a1gdm. Also hx poly this pregnancy now resolved. Also LGA fetus. Was vertex 2 days ago. Here at start of iol after receiving dose cytotec found to be breech. Discussed risks and benefits of ECV with patient and patient elected to proceed w/ ECV. Terbutaline given. Attempted with Dr. Jordan Hawks assistance, several attempts at counter clockwise rotation were unsuccessful. Fetal heart rate reassuring before, during, and after procedure. Will plan on proceeding with c/s.

## 2021-09-29 NOTE — H&P (Addendum)
OBSTETRIC ADMISSION HISTORY AND PHYSICAL  Sandra Krueger is a 36 y.o. female G60P2002 with IUP at [redacted]w[redacted]d by 45 week [place] presenting for IOL due to A1GDM (EFW 98% at [redacted]w[redacted]d). She reports +FMs, no LOF, no VB, no blurry vision, headaches, peripheral edema, or RUQ pain.  She plans on breast and bottle feeding. She is considering outpatient IUD vs. BTL for birth control postpartum.   She received her prenatal care at [facility].  Dating: By US--->  Estimated Date of Delivery: 10/06/21  Sono:   '@[redacted]w[redacted]d'$ , normal anatomy, transverse (head to maternal left) presentation, anterior placental lie, 3801 g, 98% EFW  Prenatal History/Complications:  --P5TIR --AMA --Elevated BMI (43) --Alpha-thalassemia silent carrier --Polyhydramnios (resolved) --LGA --Asthma  Past Medical History: Past Medical History:  Diagnosis Date   Asthma    COVID-19 affecting pregnancy in first trimester 03/24/2021   Late July 2022, (see care everywhere for test)   Fibroid    Fibroids    Gestational diabetes    Headache    Panic attack    last episode approx two months ago per pt    Past Surgical History: Past Surgical History:  Procedure Laterality Date   OTHER SURGICAL HISTORY     fibroids removed   OVARY SURGERY     cyst removed from ? right ovary    Obstetrical History: OB History     Gravida  3   Para  2   Term  2   Preterm      AB      Living  2      SAB      IAB      Ectopic      Multiple  0   Live Births  2           Social History Social History   Socioeconomic History   Marital status: Single    Spouse name: Not on file   Number of children: 1   Years of education: Not on file   Highest education level: Not on file  Occupational History   Not on file  Tobacco Use   Smoking status: Never   Smokeless tobacco: Never  Vaping Use   Vaping Use: Never used  Substance and Sexual Activity   Alcohol use: Not Currently   Drug use: Not Currently    Types: Marijuana    Comment:  as a teenager   Sexual activity: Yes    Birth control/protection: None  Other Topics Concern   Not on file  Social History Narrative   Not on file   Social Determinants of Health   Financial Resource Strain: Not on file  Food Insecurity: No Food Insecurity   Worried About Running Out of Food in the Last Year: Never true   Ran Out of Food in the Last Year: Never true  Transportation Needs: No Transportation Needs   Lack of Transportation (Medical): No   Lack of Transportation (Non-Medical): No  Physical Activity: Not on file  Stress: Not on file  Social Connections: Not on file    Family History: Family History  Problem Relation Age of Onset   Hypertension Mother    Hypertension Father    Diabetes Maternal Aunt    Diabetes Maternal Uncle     Allergies: Allergies  Allergen Reactions   Bee Venom Anaphylaxis   Other Anaphylaxis    All nuts   Tree Extract Anaphylaxis   Gramineae Pollens Rash    Medications Prior to Admission  Medication  Sig Dispense Refill Last Dose   Accu-Chek Softclix Lancets lancets Check blood sugar 4 times a day. 100 each 12    ADVAIR DISKUS 250-50 MCG/ACT AEPB 2 (two) times daily.      albuterol (PROVENTIL HFA;VENTOLIN HFA) 108 (90 Base) MCG/ACT inhaler Inhale 2 puffs into the lungs every 4 (four) hours as needed for wheezing or shortness of breath.      aspirin EC 81 MG tablet Take 1 tablet (81 mg total) by mouth daily. Swallow whole. 60 tablet 2    Blood Glucose Monitoring Suppl (ACCU-CHEK GUIDE) w/Device KIT 1 Device by Does not apply route in the morning, at noon, in the evening, and at bedtime. 1 kit 0    Blood Pressure Monitoring (BLOOD PRESSURE KIT) DEVI 1 Device by Does not apply route as needed. 1 each 0    cyclobenzaprine (FLEXERIL) 5 MG tablet Take 1 tablet (5 mg total) by mouth 2 (two) times daily as needed for muscle spasms. 30 tablet 0    glucose blood test strip Check blood sugar 4 times a day. 100 each 12    Prenatal 28-0.8 MG TABS  Take 1 tablet by mouth daily. 30 tablet 3    Review of Systems  All systems reviewed and negative except as stated in HPI  Blood pressure 117/76, pulse (!) 108, temperature 97.9 F (36.6 C), temperature source Oral, resp. rate 16, height $RemoveBe'5\' 4"'TihlTKVdJ$  (1.626 m), weight 114.5 kg, last menstrual period 01/13/2021, currently breastfeeding.  General Appearance: Alert, cooperative, no distress Lungs: Normal work of breathing on room air Heart: Normal rate, warm and well-perfused Abdomen: soft, non-tender; gravid Extremities: No LE edema, no calf tenderness to palpation  Presentation: Cephalic Fetal monitoring: Baseline FHR 135 bpm, moderate variability, +accels, no decels Uterine activity: Occasional  Dilation: 2 Effacement (%): Thick Station: Ballotable Exam by:: Dr Gwenlyn Perking  Prenatal labs: ABO, Rh: O positive Antibody: Negative Rubella: 3.21 (08/10 1118) RPR: Non Reactive (12/05 1458)  HBsAg: Negative (08/10 1118)  HIV: Non Reactive (12/05 1458)  GBS: Negative/-- (02/01 1414)  Abnormal GTT: A1GDM Genetic screening: LR NIPS, [place] - alpha thal carrier Anatomy US: Normal  Prenatal Transfer Tool  Maternal Diabetes: Yes:  Diabetes Type:  Diet controlled Genetic Screening: Abnormal:  Results: Other: Alpha thalassemia carrier Maternal Ultrasounds/Referrals: Normal Fetal Ultrasounds or other Referrals:  None Maternal Substance Abuse:  No Significant Maternal Medications:  None Significant Maternal Lab Results: Group B Strep negative  Results for orders placed or performed during the hospital encounter of 09/29/21 (from the past 24 hour(s))  CBC   Collection Time: 09/29/21 10:15 AM  Result Value Ref Range   WBC 6.1 4.0 - 10.5 K/uL   RBC 4.18 3.87 - 5.11 MIL/uL   Hemoglobin 10.5 (L) 12.0 - 15.0 g/dL   HCT 33.7 (L) 36.0 - 46.0 %   MCV 80.6 80.0 - 100.0 fL   MCH 25.1 (L) 26.0 - 34.0 pg   MCHC 31.2 30.0 - 36.0 g/dL   RDW 16.8 (H) 11.5 - 15.5 %   Platelets 191 150 - 400 K/uL   nRBC  0.0 0.0 - 0.2 %  Type and screen   Collection Time: 09/29/21 10:35 AM  Result Value Ref Range   ABO/RH(D) PENDING    Antibody Screen PENDING    Sample Expiration      10/02/2021,2359 Performed at [facility], [address], [place], [place] [address]     Patient Active Problem List   Diagnosis Date Noted  Gestational diabetes mellitus (GDM) 09/29/2021   Gestational diabetes 08/03/2021   Polyhydramnios affecting pregnancy 08/01/2021   LGA (large for gestational age) fetus affecting management of mother 05/23/2021   Alpha thalassemia silent carrier 04/18/2021   Low TSH level 03/30/2021   Supervision of high risk pregnancy, antepartum 03/17/2021   AMA (advanced maternal age) multigravida 35+ 03/17/2021   History of COVID-19 03/17/2021   Asthma affecting pregnancy in first trimester    Obesity in pregnancy    BMI 40.0-44.9, adult ([place]) 11/16/2017   Uterine fibroid in pregnancy 11/16/2017    Assessment/Plan:  Sandra Krueger is a 36 y.o. G3P2002 at [redacted]w[redacted]d here for IOL due to A1GDM.  #Labor: Will plan to initiate induction with buccal cytotec for cervical ripening. Will reassess in 4 hours. Plan for transition to Pitocin and AROM when appropriate.  #Pain: PRN, no epidural #FWB: Category I #ID: GBS negative #MOF: Breast/bottle #MOC: IUD outpatient vs. BTL  #A1GDM: Plan for q4hr glucose checks in latent labor, then will transition to q2hr once in active labor. Will assess need for medication PRN. EFW 98% at [redacted]w[redacted]d.  Vennie Homans, DO PGY-1 09/29/2021, 11:17 AM  GME ATTESTATION:  I saw and evaluated the patient. I agree with the findings and the plan of care as documented in the residents note. I have made changes to documentation as necessary.  IOL due A1GDM. LGA fetus, EFW 98%. Counseled about risk of shoulder dystocia and patient voiced understanding. Starting induction with buccal Cytotec. Plan for Pitocin and AROM after cervical ripening. Will check CBGs every 4  hours at this time. Will continue to monitor. Cat 1 tracing.   Vilma Meckel, MD OB Fellow, [place] [facility] 09/29/2021 1:11 PM

## 2021-09-29 NOTE — Anesthesia Preprocedure Evaluation (Addendum)
Anesthesia Evaluation  Patient identified by MRN, date of birth, ID band Patient awake    Reviewed: Allergy & Precautions, NPO status , Patient's Chart, lab work & pertinent test results  Airway Mallampati: III  TM Distance: >3 FB Neck ROM: Full    Dental no notable dental hx.    Pulmonary asthma ,    Pulmonary exam normal breath sounds clear to auscultation       Cardiovascular negative cardio ROS Normal cardiovascular exam Rhythm:Regular Rate:Normal     Neuro/Psych  Headaches, PSYCHIATRIC DISORDERS Anxiety    GI/Hepatic negative GI ROS, Neg liver ROS,   Endo/Other  diabetes, GestationalMorbid obesity  Renal/GU negative Renal ROS  negative genitourinary   Musculoskeletal negative musculoskeletal ROS (+)   Abdominal   Peds negative pediatric ROS (+)  Hematology negative hematology ROS (+)   Anesthesia Other Findings   Reproductive/Obstetrics (+) Pregnancy                             Anesthesia Physical Anesthesia Plan  ASA: 3  Anesthesia Plan: Spinal   Post-op Pain Management:    Induction:   PONV Risk Score and Plan: 2 and Treatment may vary due to age or medical condition, Scopolamine patch - Pre-op, Dexamethasone and Ondansetron  Airway Management Planned: Natural Airway  Additional Equipment:   Intra-op Plan:   Post-operative Plan:   Informed Consent: I have reviewed the patients History and Physical, chart, labs and discussed the procedure including the risks, benefits and alternatives for the proposed anesthesia with the patient or authorized representative who has indicated his/her understanding and acceptance.     Dental advisory given  Plan Discussed with: CRNA and Anesthesiologist  Anesthesia Plan Comments: (Spinal. GETA/RSI as backup plan.  Norton Blizzard, MD  )       Anesthesia Quick Evaluation

## 2021-09-29 NOTE — Progress Notes (Signed)
L&D Note I introduced myself to the patient. She last ate a full lunch at 1330. Will plan for 2130 tonight since fetus is category I with accels. Plan for azithro and ancef  Pt does not want btl or iud.   Durene Romans MD Attending Center for [employer] (Faculty Practice) 09/29/2021 Time: [phone]

## 2021-09-29 NOTE — Discharge Summary (Signed)
Postpartum Discharge Summary      Patient Name: Sandra Krueger DOB: [birth year] MRN: [id number]  Date of admission: 09/29/2021 Delivery date:09/29/2021  Delivering provider: Aletha Halim  Date of discharge: 10/02/2021  Admitting diagnosis: Gestational diabetes mellitus (GDM) [O24.419] Intrauterine pregnancy: [redacted]w[redacted]d     Secondary diagnosis:  Principal Problem:   Gestational diabetes mellitus (GDM) Active Problems:   Uterine fibroid in pregnancy   Supervision of high risk pregnancy, antepartum   AMA (advanced maternal age) multigravida 35+   LGA (large for gestational age) fetus affecting management of mother   History of primary cesarean section  Additional problems: None    Discharge diagnosis: Term Pregnancy Delivered and GDM A1                                              Post partum procedures: None Augmentation: Cytotec Complications: None  Hospital course: Induction of Labor With Cesarean Section   36 y.o. yo G3P3003 at [redacted]w[redacted]d was admitted to the hospital 09/29/2021 for induction of labor. Patient presented for IOL for A1GDM and was started with cytotec. It was then discovered that fetus was breech after significant change in where heart rate was able to be traced on monitor. An ECV was attempted without success. And therefore patient had a cesarean delivery.. The patient went for cesarean section due to Malpresentation. Delivery details are as follows: Membrane Rupture Time/Date: 10:15 PM ,09/29/2021   Delivery Method:C-Section, Low Transverse  Details of operation can be found in separate operative Note.  Patient had an uncomplicated postpartum course. She is ambulating, tolerating a regular diet, passing flatus, and urinating well.  Patient is discharged home in stable condition on 10/02/21.      Newborn Data: Birth date:09/29/2021  Birth time:10:16 PM  Gender:Female  Living status:Living  Apgars:8 ,9  Weight:3660 g                                Magnesium Sulfate  received: No BMZ received: No Rhophylac:N/A MMR:N/A T-DaP: offered post partum Flu: Declined Transfusion:No  Physical exam  Vitals:   10/01/21 0625 10/01/21 1606 10/01/21 2224 10/02/21 0529  BP: 107/66 113/71 118/69 110/67  Pulse: 79 (!) 103 100 83  Resp: $Remo'16 18 18 16  'YqhxC$ Temp: 98.4 F (36.9 C) 98 F (36.7 C) 98.5 F (36.9 C) 98 F (36.7 C)  TempSrc: Oral Axillary Axillary Oral  SpO2: 100%  98% 96%  Weight:      Height:       General: alert Lochia: appropriate Uterine Fundus: firm Incision: Honeycomb dressing is clean, dry, and intact DVT Evaluation: No evidence of DVT seen on physical exam. Labs: Lab Results  Component Value Date   WBC 10.4 09/30/2021   HGB 9.2 (L) 09/30/2021   HCT 28.8 (L) 09/30/2021   MCV 81.1 09/30/2021   PLT 159 09/30/2021   CMP Latest Ref Rng & Units 09/29/2021  Glucose 70 - 99 mg/dL 95  BUN 6 - 20 mg/dL 5(L)  Creatinine 0.44 - 1.00 mg/dL 0.55  Sodium 135 - 145 mmol/L 136  Potassium 3.5 - 5.1 mmol/L 3.2(L)  Chloride 98 - 111 mmol/L 107  CO2 22 - 32 mmol/L 20(L)  Calcium 8.9 - 10.3 mg/dL 8.9  Total Protein 6.5 - 8.1 g/dL 6.4(L)  Total Bilirubin 0.3 -  1.2 mg/dL 0.2(L)  Alkaline Phos 38 - 126 U/L 185(H)  AST 15 - 41 U/L 16  ALT 0 - 44 U/L 10   Edinburgh Score: Edinburgh Postnatal Depression Scale Screening Tool 10/01/2021  I have been able to laugh and see the funny side of things. 0  I have looked forward with enjoyment to things. 0  I have blamed myself unnecessarily when things went wrong. 1  I have been anxious or worried for no good reason. 2  I have felt scared or panicky for no good reason. 0  Things have been getting on top of me. 1  I have been so unhappy that I have had difficulty sleeping. 1  I have felt sad or miserable. 2  I have been so unhappy that I have been crying. 0  The thought of harming myself has occurred to me. 0  Edinburgh Postnatal Depression Scale Total 7     After visit meds:  Allergies as of 10/02/2021        Reactions   Bee Venom Anaphylaxis   Other Anaphylaxis   All nuts   Tree Extract Anaphylaxis   Gramineae Pollens Rash        Medication List     STOP taking these medications    aspirin EC 81 MG tablet   cyclobenzaprine 5 MG tablet Commonly known as: FLEXERIL   Prenatal 28-0.8 MG Tabs       TAKE these medications    Accu-Chek Guide w/Device Kit 1 Device by Does not apply route in the morning, at noon, in the evening, and at bedtime.   Accu-Chek Softclix Lancets lancets Check blood sugar 4 times a day.   acetaminophen 500 MG tablet Commonly known as: TYLENOL Take 2 tablets (1,000 mg total) by mouth every 6 (six) hours.   albuterol 108 (90 Base) MCG/ACT inhaler Commonly known as: VENTOLIN HFA Inhale 2 puffs into the lungs every 4 (four) hours as needed for wheezing or shortness of breath.   Blood Pressure Kit Devi 1 Device by Does not apply route as needed.   ferrous sulfate 325 (65 FE) MG tablet Take 1 tablet (325 mg total) by mouth every other day.   glucose blood test strip Check blood sugar 4 times a day.   ibuprofen 600 MG tablet Commonly known as: ADVIL Take 1 tablet (600 mg total) by mouth every 6 (six) hours.   oxyCODONE 5 MG immediate release tablet Commonly known as: Oxy IR/ROXICODONE Take 1-2 tablets (5-10 mg total) by mouth every 6 (six) hours as needed for up to 10 days for severe pain.   prenatal multivitamin Tabs tablet Take 1 tablet by mouth daily at 12 noon.         Discharge home in stable condition Infant Feeding: Bottle and Breast Infant Disposition:home with mother Discharge instruction: per After Visit Summary and Postpartum booklet. Activity: Advance as tolerated. Pelvic rest for 6 weeks.  Diet: routine diet Future Appointments: Future Appointments  Date Time Provider [place]  10/06/2021 10:20 AM [facility] NURSE [facility]  11/15/2021  8:20 AM [facility]  11/15/2021  9:15 AM Patriciaann Clan,  DO [facility]   Follow up Visit: Message sent to [facility] by Dr. Cy Blamer on 2/16   Please schedule this patient for a In person postpartum visit in 4 weeks with the following provider: Any provider. Additional Postpartum F/U:2 hour GTT and Incision check 1 week  High risk pregnancy complicated by: GDM Delivery mode:  C-Section,  Low Transverse  Anticipated Birth Control:   plans outpatient IUD  Renard Matter, MD, MPH OB Fellow, Faculty Practice

## 2021-09-29 NOTE — Progress Notes (Signed)
Labor Progress Note Sandra Krueger is a 36 y.o. G3P2002 at [redacted]w[redacted]d presented for IOL due to A1GDM.   S: Feeling more frequent contractions, denies LOF.   O:  BP 114/69    Pulse 94    Temp 97.9 F (36.6 C) (Oral)    Resp 18    Ht 5\' 4"  (1.626 m)    Wt 114.5 kg    LMP 01/13/2021 (Within Days)    BMI 43.32 kg/m  EFM: Baseline FHR 135 bpm/moderate variability/+accels, no decels  CVE: Dilation: 3 Effacement (%): Thick Cervical Position: Middle Station: Ballotable Presentation: Pilar Plate Breech Exam by:: Annitta Jersey, rn   A&P: 36 y.o. [id number] [redacted]w[redacted]d here for IOL due to A1GDM (EFW 98% at [redacted]w[redacted]d).  #Labor: RN reporting notable movement of tracing from original location with EFM. Upon SVE, unable to palpate presenting part. Bedside ultrasound showing breech presentation with occiput in maternal LUQ. Given one dose of  terbutaline. Discussed options with patient, attempted external version which was unsuccessful. Fetal heart rate reassuring throughout. Will plan to proceed with cesarean section. Last ate full meal around 1330. #Pain: PRN #FWB: Cat I #GBS negative  #A1GDM: Blood glucose well controlled. Will continue to monitor.  Karsten Fells, DO PGY-1 09/29/2021, 4:35 PM

## 2021-09-29 NOTE — Progress Notes (Signed)
Checked in on patient prior to being moved to OR for CS (for breech presentation). Patient doing well and feels ready.  At bedside patient contracting painfully every 3-5 min. Had received dose of terbutaline last at 1654.   - Give additional dose of terbutaline now - abx already ordered - Starting prep for OR. To OR when ready  Renard Matter, MD, MPH OB Fellow, Faculty Practice

## 2021-09-29 NOTE — Op Note (Addendum)
Operative Note   Patient: Sandra Krueger  Date of Procedure: 09/29/2021  Procedure: Primary Low Transverse Cesarean via modified maylard skin incision  Pre-operative Diagnosis: Pregnancy at 94/0. IOL for GDMa1. Early labor. Pilar Plate breech  Post-operative Diagnosis: Same  Surgeon: Surgeon(s) and Role:    * Aletha Halim, MD - Primary Assistant:   Renard Matter, MD (OB Fellow)  Anesthesia: spinal  Anesthesiologist: Merlinda Frederick, MD   Antibiotics: Cefazolin and Azithromycin   Estimated Blood Loss: 800 ml  Total IV Fluids: 2400 ml  Urine Output: 250 mL via foley  Specimens: None   Complications: no complications   Indications: Patient underwent IOL by previous team and then discovered to be breech. ECV attempt made by them but unsuccessful.   Findings: Viable infant in frank breech  presentation, no nuchal cord present. Apgars 8 , 9 ,  Weight 3660 g . Clear amniotic fluid. Normal placenta, three vessel cord. Normal uterus with posterior, left mid fundal subserosal 4-5 cm fibroid, Normal bilateral fallopian tubes, Normal bilateral ovaries.  Procedure Details: A Time Out was held and the above information confirmed. The patient received intravenous antibiotics and had sequential compression devices applied to her lower extremities preoperatively. The patient was taken back to the operative suite where spinal anesthesia was administered. After induction of anesthesia, the patient was draped and prepped in the usual sterile manner and placed in a dorsal supine position with a leftward tilt. A low transverse skin incision was made with scalpel and carried down through the subcutaneous tissue to the fascia. Fascial incision was made and extended transversely. The fascia was separated from the underlying rectus tissue superiorly and inferiorly. The rectus muscles were separated in the midline bluntly and the peritoneum was entered bluntly. An Alexis retractor was placed to aid in  visualization of the uterus. A bladder flap was not developed. Due to anticipation of large fetus the alexis retractor was removed briefly and the rectus was transected by ~1cm  on both sides. At that time the alexis retractor was placed back in. A low transverse uterine incision was made. The infant was successfully delivered from breech presentation and due to no immediate cry from fetus the umbilical cord was clamped immediately. Immediately after getting to warmer fetus with vigorous cry. The placenta was removed Intact and appeared normal. The uterine incision was closed with running locked sutures of 0-Monocryl, and then a second imbricating layer was also placed with 0-Monocryl. There was an additional figure of eight stitch placed in the middle of the hysterotomy for some bleeding. Overall, excellent hemostasis was noted. The abdomen and the pelvis were cleared of all clot and debris and the Ubaldo Glassing was removed. Hemostasis was confirmed on all surfaces.  The peritoneum was reapproximated using 2-0 vicryl . The fascia was then closed using 0 Vicryl in a running fashion. The subcutaneous layer was reapproximated with plain gut and the skin was closed with a 4-0 vicryl subcuticular stitch. The patient tolerated the procedure well. Sponge, lap, instrument and needle counts were correct x 2. She was taken to the recovery room in stable condition.  Disposition: PACU - hemodynamically stable.    Signed: Renard Matter, MD, MPH [facility])  Agree with above. I was present and scrubbed for the entire procedure.   Durene Romans MD Attending [facility] [employer])

## 2021-09-29 NOTE — Anesthesia Procedure Notes (Signed)
Spinal  Patient location during procedure: OR Start time: 09/29/2021 9:42 PM End time: 09/29/2021 9:48 PM Reason for block: surgical anesthesia Staffing Performed: anesthesiologist  Anesthesiologist: Merlinda Frederick, MD Preanesthetic Checklist Completed: patient identified, IV checked, risks and benefits discussed, surgical consent, monitors and equipment checked, pre-op evaluation and timeout performed Spinal Block Patient position: sitting Prep: DuraPrep Patient monitoring: cardiac monitor, continuous pulse ox and blood pressure Approach: midline Location: L3-4 Injection technique: single-shot Needle Needle type: Pencan  Needle gauge: 24 G Needle length: 9 cm Assessment Sensory level: T4 Events: CSF return Additional Notes Functioning IV was confirmed and monitors were applied. Sterile prep and drape, including hand hygiene and sterile gloves were used. The patient was positioned and the spine was prepped. The skin was anesthetized with lidocaine.  Free flow of clear CSF was obtained prior to injecting local anesthetic into the CSF.  The spinal needle aspirated freely following injection.  The needle was carefully withdrawn.  The patient tolerated the procedure well.

## 2021-09-29 NOTE — Transfer of Care (Signed)
Immediate Anesthesia Transfer of Care Note  Patient: Sandra Krueger  Procedure(s) Performed: CESAREAN SECTION  Patient Location: PACU  Anesthesia Type:Spinal  Level of Consciousness: awake, alert  and oriented  Airway & Oxygen Therapy: Patient Spontanous Breathing  Post-op Assessment: Report given to RN and Post -op Vital signs reviewed and stable  Post vital signs: Reviewed and stable  Last Vitals:  Vitals Value Taken Time  BP 106/50 09/29/21 2305  Temp    Pulse 117 09/29/21 2310  Resp 35 09/29/21 2310  SpO2 96 % 09/29/21 2310  Vitals shown include unvalidated device data.  Last Pain:  Vitals:   09/29/21 1920  TempSrc: Axillary  PainSc:          Complications: No notable events documented.

## 2021-09-30 ENCOUNTER — Encounter (HOSPITAL_COMMUNITY): Payer: Self-pay | Admitting: Obstetrics and Gynecology

## 2021-09-30 LAB — CBC
HCT: 28.8 % — ABNORMAL LOW (ref 36.0–46.0)
Hemoglobin: 9.2 g/dL — ABNORMAL LOW (ref 12.0–15.0)
MCH: 25.9 pg — ABNORMAL LOW (ref 26.0–34.0)
MCHC: 31.9 g/dL (ref 30.0–36.0)
MCV: 81.1 fL (ref 80.0–100.0)
Platelets: 159 10*3/uL (ref 150–400)
RBC: 3.55 MIL/uL — ABNORMAL LOW (ref 3.87–5.11)
RDW: 16.9 % — ABNORMAL HIGH (ref 11.5–15.5)
WBC: 10.4 10*3/uL (ref 4.0–10.5)
nRBC: 0 % (ref 0.0–0.2)

## 2021-09-30 LAB — RPR: RPR Ser Ql: NONREACTIVE

## 2021-09-30 MED ORDER — OXYTOCIN-SODIUM CHLORIDE 30-0.9 UT/500ML-% IV SOLN
2.5000 [IU]/h | INTRAVENOUS | Status: AC
  Administered 2021-09-30: 2.5 [IU]/h via INTRAVENOUS
  Filled 2021-09-30: qty 500

## 2021-09-30 MED ORDER — COCONUT OIL OIL: 1.0000 "application " | TOPICAL_OIL | Status: DC | PRN

## 2021-09-30 MED ORDER — SIMETHICONE 80 MG PO CHEW
80.0000 mg | CHEWABLE_TABLET | Freq: Three times a day (TID) | ORAL | Status: DC
  Administered 2021-09-30 – 2021-10-01 (×6): 80 mg via ORAL
  Filled 2021-09-30 (×8): qty 1

## 2021-09-30 MED ORDER — HYDROXYZINE HCL 10 MG PO TABS
10.0000 mg | ORAL_TABLET | Freq: Three times a day (TID) | ORAL | Status: DC | PRN
  Administered 2021-09-30: 10 mg via ORAL
  Filled 2021-09-30: qty 1

## 2021-09-30 MED ORDER — MENTHOL 3 MG MT LOZG: 1.0000 | LOZENGE | OROMUCOSAL | Status: DC | PRN

## 2021-09-30 MED ORDER — IBUPROFEN 600 MG PO TABS
600.0000 mg | ORAL_TABLET | Freq: Four times a day (QID) | ORAL | Status: DC
  Administered 2021-09-30 – 2021-10-02 (×8): 600 mg via ORAL
  Filled 2021-09-30 (×9): qty 1

## 2021-09-30 MED ORDER — SENNOSIDES-DOCUSATE SODIUM 8.6-50 MG PO TABS
2.0000 | ORAL_TABLET | Freq: Every day | ORAL | Status: DC
  Administered 2021-09-30 – 2021-10-02 (×2): 2 via ORAL
  Filled 2021-09-30 (×3): qty 2

## 2021-09-30 MED ORDER — DIBUCAINE (PERIANAL) 1 % EX OINT: 1.0000 "application " | TOPICAL_OINTMENT | CUTANEOUS | Status: DC | PRN

## 2021-09-30 MED ORDER — OXYCODONE HCL 5 MG PO TABS
5.0000 mg | ORAL_TABLET | Freq: Four times a day (QID) | ORAL | Status: DC | PRN
  Administered 2021-09-30 – 2021-10-02 (×2): 5 mg via ORAL
  Filled 2021-09-30 (×2): qty 1

## 2021-09-30 MED ORDER — ACETAMINOPHEN 500 MG PO TABS
1000.0000 mg | ORAL_TABLET | Freq: Four times a day (QID) | ORAL | Status: DC
  Administered 2021-09-30 – 2021-10-02 (×8): 1000 mg via ORAL
  Filled 2021-09-30 (×9): qty 2

## 2021-09-30 MED ORDER — ENOXAPARIN SODIUM 60 MG/0.6ML IJ SOSY
50.0000 mg | PREFILLED_SYRINGE | INTRAMUSCULAR | Status: DC
  Administered 2021-09-30: 50 mg via SUBCUTANEOUS
  Filled 2021-09-30 (×2): qty 0.6

## 2021-09-30 MED ORDER — FERROUS SULFATE 325 (65 FE) MG PO TABS
325.0000 mg | ORAL_TABLET | ORAL | Status: DC
  Administered 2021-09-30 – 2021-10-02 (×2): 325 mg via ORAL
  Filled 2021-09-30 (×2): qty 1

## 2021-09-30 MED ORDER — WITCH HAZEL-GLYCERIN EX PADS: 1.0000 "application " | MEDICATED_PAD | CUTANEOUS | Status: DC | PRN

## 2021-09-30 MED ORDER — PRENATAL MULTIVITAMIN CH
1.0000 | ORAL_TABLET | Freq: Every day | ORAL | Status: DC
  Administered 2021-09-30 – 2021-10-01 (×2): 1 via ORAL
  Filled 2021-09-30 (×2): qty 1

## 2021-09-30 MED ORDER — TETANUS-DIPHTH-ACELL PERTUSSIS 5-2.5-18.5 LF-MCG/0.5 IM SUSY: 0.5000 mL | PREFILLED_SYRINGE | Freq: Once | INTRAMUSCULAR | Status: DC

## 2021-09-30 MED ORDER — DIPHENHYDRAMINE HCL 25 MG PO CAPS
25.0000 mg | ORAL_CAPSULE | Freq: Four times a day (QID) | ORAL | Status: DC | PRN
  Administered 2021-09-30: 25 mg via ORAL
  Filled 2021-09-30: qty 1

## 2021-09-30 MED ORDER — SIMETHICONE 80 MG PO CHEW: 80.0000 mg | CHEWABLE_TABLET | ORAL | Status: DC | PRN

## 2021-09-30 NOTE — Anesthesia Postprocedure Evaluation (Signed)
Anesthesia Post Note  Patient: Sandra Krueger  Procedure(s) Performed: CESAREAN SECTION     Patient location during evaluation: PACU Anesthesia Type: Spinal Level of consciousness: oriented and awake and alert Pain management: pain level controlled Vital Signs Assessment: post-procedure vital signs reviewed and stable Respiratory status: spontaneous breathing and respiratory function stable Cardiovascular status: blood pressure returned to baseline and stable Postop Assessment: no headache, no backache, no apparent nausea or vomiting and spinal receding Anesthetic complications: no   No notable events documented.  Last Vitals:  Vitals:   09/30/21 0000 09/30/21 0015  BP: 101/67 (!) 97/56  Pulse: 95 88  Resp: 19 20  Temp:    SpO2: 98% 97%    Last Pain:  Vitals:   09/29/21 2306  TempSrc: Axillary  PainSc:    Pain Goal:    LLE Motor Response: Purposeful movement (09/30/21 0015) LLE Sensation: Tingling (09/30/21 0015) RLE Motor Response: Purposeful movement (09/30/21 0015) RLE Sensation: Tingling (09/30/21 0015)     Epidural/Spinal Function Cutaneous sensation: Able to Wiggle Toes (09/30/21 0015), Patient able to flex knees: Yes (09/30/21 0015), Patient able to lift hips off bed: No (09/30/21 0015), Back pain beyond tenderness at insertion site: No (09/30/21 0015), Progressively worsening motor and/or sensory loss: No (09/30/21 0015), Bowel and/or bladder incontinence post epidural: No (09/30/21 0015)  Merlinda Frederick

## 2021-09-30 NOTE — Progress Notes (Addendum)
POSTPARTUM PROGRESS NOTE  Subjective: Sandra Krueger is a 36 y.o. G3P3003 POD#1 s/p pLTCS at [redacted]w[redacted]d.  She reports she doing well. No acute events overnight. She denies any problems with ambulating, voiding or po intake. Denies nausea or vomiting. She has not passed flatus. Pain is well controlled.  Lochia is minimal.   Foley catheter still in place in bladder.   Reports having itching without rash. Benadryl minimally helpful. Vistaril somewhat helpful.   Objective: Blood pressure (!) 92/57, pulse 83, temperature 98 F (36.7 C), temperature source Oral, resp. rate 18, height 5\' 4"  (1.626 m), weight 114.5 kg, last menstrual period 01/13/2021, SpO2 100 %, unknown if currently breastfeeding.  Physical Exam:  General: alert, cooperative and no distress Chest: no respiratory distress Abdomen: soft, non-tender  Uterine Fundus: firm, appropriately tender Extremities: No calf swelling or tenderness   mild edema  Recent Labs    09/29/21 1015  HGB 10.5*  HCT 33.7*    Assessment/Plan: Sandra Krueger is a 36 y.o. [id number] POD#1 s/p pLTCS at [redacted]w[redacted]d  Routine Postpartum Care: Doing well, pain well-controlled.  -- Continue routine care, lactation support  -- Contraception: plans outpatient IUD -- Feeding: breast  #Acute blood loss anemia HgB 10.5>9.2. Asymptomatic at this time. Will start PO Iron  #Itching Likely post spinal related. Will increase dose of vistaril to see if helps more. Discussed with anesthesia. Nubain is short supplied at this time and not available.  Dispo: Plan for discharge POD#2 or 3 pending clinical status.  Renard Matter, MD, MPH OB Fellow, [facility] for [facility]

## 2021-09-30 NOTE — Lactation Note (Signed)
This note was copied from a baby's chart. Lactation Consultation Note  Patient Name: Sandra Krueger [id number]'[id number] Date: 09/30/2021 Reason for consult: Initial assessment;Term Age:36 hours   P3 mother whose infant is now 83 hours old.  This is a term baby at 39+0 weeks.  Mother breast fed her first two children (now 42 and 52 years old) for 3 months each.  Her current feeding preference is breast/formula.  Mother reported that "[name]" has been latching and breast feeding well. LATCH scores 7-8; voiding/stooling.  She had no questions/concerns related to breast feeding.  Encouraged to feed 8-12 times/24 hours or sooner if baby shows feeding cues.  Suggested she continue hand expression and feed back any EBM she obtains to baby via finger/spoon feeding.  Mother will call as needed for latch assistance.   Maternal Data Has patient been taught Hand Expression?: Yes Does the patient have breastfeeding experience prior to this delivery?: Yes How long did the patient breastfeed?: 3 months with each of her other two children  Feeding Mother's Current Feeding Choice: Breast Milk and Formula  LATCH Score Latch: Repeated attempts needed to sustain latch, nipple held in mouth throughout feeding, stimulation needed to elicit sucking reflex.  Audible Swallowing: A few with stimulation  Type of Nipple: Everted at rest and after stimulation  Comfort (Breast/Nipple): Soft / non-tender  Hold (Positioning): Assistance needed to correctly position infant at breast and maintain latch.  LATCH Score: 7   Lactation Tools Discussed/Used    Interventions Interventions: Breast feeding basics reviewed;Education  Discharge Pump: Personal  Consult Status Consult Status: Follow-up Date: 10/01/21 Follow-up type: In-patient    Shella Lahman R Devyn Griffing 09/30/2021, 11:26 AM

## 2021-09-30 NOTE — Lactation Note (Signed)
This note was copied from a baby's chart. Lactation Consultation Note  Patient Name: Girl Keryn Nessler Today's Date: 09/30/2021   Age:36 hours   LC Note:  Attempted to visit with family, however, room dark and quiet.  Will return later today.   Maternal Data    Feeding    LATCH Score                    Lactation Tools Discussed/Used    Interventions    Discharge    Consult Status      Iona Stay R Johnston Maddocks 09/30/2021, 7:56 AM

## 2021-09-30 NOTE — Social Work (Signed)
CSW consulted for food insecurity.   CSW met with MOB to assess and provide support. CSW introduced self and role. CSW observed MOB holding infant 'Kensley'. CSW informed MOB of the reason for consult and assessed current mood. MOB was very pleasant and reported she is currently doing well. MOB shared she experienced a c-section, which was not expected. CSW empathized with MOB's experience. CSW inquired on possible food insecurity. MOB reported she receives food stamps and does not have food insecurity. MOB reported all of her essential needs are met and she has everything needed for infant. MOB reported no stressors or concerns at this time.   CSW identifies no further need for intervention at this time.   Darra Lis, [place] [employer] [phone]

## 2021-09-30 NOTE — Lactation Note (Signed)
This note was copied from a baby's chart. Lactation Consultation Note  Patient Name: Sandra Krueger Today's Date: 09/30/2021   Age:36 hours Per RN Otila Kluver) mom declined [place] and would like to be seen in the morning. Maternal Data    Feeding    LATCH Score                    Lactation Tools Discussed/Used    Interventions    Discharge    Consult Status      Vicente Serene 09/30/2021, 2:27 AM

## 2021-10-01 LAB — GLUCOSE, CAPILLARY
Glucose-Capillary: 68 mg/dL — ABNORMAL LOW (ref 70–99)
Glucose-Capillary: 79 mg/dL (ref 70–99)

## 2021-10-01 NOTE — Progress Notes (Signed)
Bs is 49. Orange juice and graham crackers given

## 2021-10-01 NOTE — Lactation Note (Signed)
This note was copied from a baby's chart. Lactation Consultation Note  Patient Name: Sandra Krueger HWTUU'E Date: 10/01/2021 Reason for consult: Follow-up assessment;Term;Infant weight loss (-9% weight loss, C/S delivery.) Age:36 hours Per mom, she has been mostly bottle feeding infant due to infant's weight loss but she does want to breastfeed infant, she felt she was doing something wrong. LC reassured mom that she wasn't and RN/LC services can help with her latching infant at breast. [place] set mom up with DEBP, mom was fitted with 30 mm breast flange, Mom pumped 5 mls of colostrum, mom was happy to see that she has breast milk.  Infant started cuing to feed, mom latched infant on her right breast using the football hold, LC ask mom wait until infant's mouth is wide for deep latch, infant latched with depth, swallows observed. Infant was still breastfeeding after 10 minutes, mom plans to  supplement infant  with her EBM that she pumped first and then formula.  Mom understands breast milk is safe at room temperature for 4 hours whereas formula is only 1 hour. Mom's plan: 1- Mom will latch infant first every feeding according to hunger cues, afterwards  mom will supplement infant with her EBM that she pumped/ and then formula. 2- Mom will continue to offer 30 + mls per feeding of EBM/Formula du to infant's high weight loss. 3- Mom will continue to use DEBP every 3 hours for 15 minutes on initial setting. 4-Mom will call RN/LC for further latch assistance if needed. Maternal Data    Feeding Mother's Current Feeding Choice: Breast Milk and Formula  LATCH Score Latch: Grasps breast easily, tongue down, lips flanged, rhythmical sucking.  Audible Swallowing: A few with stimulation  Type of Nipple: Everted at rest and after stimulation  Comfort (Breast/Nipple): Soft / non-tender  Hold (Positioning): Assistance needed to correctly position infant at breast and maintain latch.  LATCH Score:  8   Lactation Tools Discussed/Used Tools: Pump Breast pump type: Double-Electric Breast Pump Pump Education: Setup, frequency, and cleaning;Milk Storage Reason for Pumping: Infant with high weight loss , mom concern she doesnt have any EBM. Pumping frequency: Mom will pump every 3 hours for 15 minutes on inital setting. Pumped volume: 5 mL (Mom expressed 5 mls of colostrum while pumping.)  Interventions    Discharge Pump: Personal (Medela DEBP)  Consult Status Consult Status: Follow-up Date: 10/02/21 Follow-up type: In-patient    Vicente Serene 10/01/2021, 7:08 PM

## 2021-10-01 NOTE — Progress Notes (Signed)
POSTPARTUM PROGRESS NOTE  Subjective: Sandra Krueger is a 36 y.o. G3P3003 POD#2 s/p pLTCS at [redacted]w[redacted]d.  She reports she doing well. No acute events overnight. She denies any problems with ambulating, voiding or po intake. Denies nausea or vomiting. She has  passed flatus. Pain is moderately controlled.  Lochia is scant.  Objective: Blood pressure 107/66, pulse 79, temperature 98.4 F (36.9 C), temperature source Oral, resp. rate 16, height 5\' 4"  (1.626 m), weight 114.5 kg, last menstrual period 01/13/2021, SpO2 100 %, unknown if currently breastfeeding.  Physical Exam:  General: alert, cooperative and no distress Chest: no respiratory distress Abdomen: soft, non-tender   incision clean dry intact with honeycomb dressing  Uterine Fundus: firm, appropriately tender Extremities: No calf swelling or tenderness   no edema  Recent Labs    09/29/21 1015 09/30/21 0325  HGB 10.5* 9.2*  HCT 33.7* 28.8*    Assessment/Plan: Sandra Krueger is a 36 y.o. [id number] POD#2 s/p pLTCS at [redacted]w[redacted]d  POD#2: Doing well, pain moderately controlled and improving. H/H appropriate.  -- Routine postpartum care -- Encouraged up OOB -- Lovenox for VTE prophylaxis  #Acute blood loss anemia HgB 10.5>9.2, On PO iron  Routine Postpartum Care -- Contraception: outpatient IUD -- Feeding: both  Dispo: Plan for discharge on POD#3 per patient preference.  Renard Matter, MD, MPH OB Fellow, [facility] for [facility]

## 2021-10-02 LAB — GLUCOSE, CAPILLARY
Glucose-Capillary: 67 mg/dL — ABNORMAL LOW (ref 70–99)
Glucose-Capillary: 86 mg/dL (ref 70–99)

## 2021-10-02 MED ORDER — OXYCODONE HCL 5 MG PO TABS: 5.0000 mg | ORAL_TABLET | Freq: Four times a day (QID) | ORAL | 0 refills | Status: AC | PRN

## 2021-10-02 MED ORDER — FERROUS SULFATE 325 (65 FE) MG PO TABS: 325.0000 mg | ORAL_TABLET | ORAL | 0 refills | Status: DC

## 2021-10-02 MED ORDER — PRENATAL MULTIVITAMIN CH: 1.0000 | ORAL_TABLET | Freq: Every day | ORAL | 0 refills | Status: DC

## 2021-10-02 MED ORDER — IBUPROFEN 600 MG PO TABS: 600.0000 mg | ORAL_TABLET | Freq: Four times a day (QID) | ORAL | 0 refills | Status: DC

## 2021-10-02 MED ORDER — ACETAMINOPHEN 500 MG PO TABS: 1000.0000 mg | ORAL_TABLET | Freq: Four times a day (QID) | ORAL | 0 refills | Status: AC

## 2021-10-02 NOTE — Lactation Note (Signed)
This note was copied from a baby's chart. Lactation Consultation Note  Patient Name: Sandra Krueger [id number] Date: 10/02/2021 Reason for consult: Follow-up assessment;Term;Infant weight loss;Other (Comment) (8 % weight loss, per mom plans to breast 15 - 20 mins and then supplement with EBM or formula. LC reviewed the doc flow sheets and updated. LC reviewed BF D/C teaching . mom aware of the Lc resources after D/C.) Age:36 hours  Maternal Data    Feeding Mother's Current Feeding Choice: Breast Milk and Formula Nipple Type: Extra Slow Flow  LATCH Score                    Lactation Tools Discussed/Used Breast pump type: Double-Electric Breast Pump Pump Education: Milk Storage  Interventions    Discharge Discharge Education: Engorgement and breast care;Warning signs for feeding baby Pump: DEBP;Personal  Consult Status Consult Status: Complete Date: 10/02/21    Myer Haff 10/02/2021, 11:36 AM

## 2021-10-06 ENCOUNTER — Ambulatory Visit (INDEPENDENT_AMBULATORY_CARE_PROVIDER_SITE_OTHER): Payer: Medicaid Other

## 2021-10-06 ENCOUNTER — Other Ambulatory Visit: Payer: Self-pay

## 2021-10-06 NOTE — Progress Notes (Signed)
Pt here today for wound check. Pt had C-section on 09/29/2021.  Pt denies any drainage, redness or fever to site.   Incision today is clean, dry, intact and well approximated. Pt had honeycomb dressing that was removed today. Pt tolerated well.   Pt advised to keep clean and dry. Pt verbalized understanding.   Pt has scheduled PP visit on 11/15/2021 with 2 hr GTT. Pt verbalized understanding and agreeable to date and time of appt.   Colletta Maryland, RN

## 2021-11-15 ENCOUNTER — Other Ambulatory Visit: Payer: Medicaid Other

## 2021-11-15 ENCOUNTER — Ambulatory Visit: Payer: Medicaid Other | Admitting: Family Medicine

## 2021-11-28 ENCOUNTER — Other Ambulatory Visit: Payer: Self-pay

## 2021-11-28 DIAGNOSIS — O0990 Supervision of high risk pregnancy, unspecified, unspecified trimester: Secondary | ICD-10-CM

## 2021-11-28 DIAGNOSIS — O24410 Gestational diabetes mellitus in pregnancy, diet controlled: Secondary | ICD-10-CM

## 2021-12-01 ENCOUNTER — Ambulatory Visit (INDEPENDENT_AMBULATORY_CARE_PROVIDER_SITE_OTHER): Payer: Medicaid Other | Admitting: Obstetrics & Gynecology

## 2021-12-01 ENCOUNTER — Encounter: Payer: Self-pay | Admitting: Obstetrics & Gynecology

## 2021-12-01 ENCOUNTER — Other Ambulatory Visit: Payer: Medicaid Other

## 2021-12-01 DIAGNOSIS — O24410 Gestational diabetes mellitus in pregnancy, diet controlled: Secondary | ICD-10-CM

## 2021-12-01 MED ORDER — NORETHINDRONE 0.35 MG PO TABS: 1.0000 | ORAL_TABLET | Freq: Every day | ORAL | 11 refills | Status: DC

## 2021-12-01 NOTE — Progress Notes (Signed)
Patient reports pain in Csection wound. She describes the pain as "tender with pressure". She denies any discharge or inflammation. Ms Sandra Krueger also complains of mid to lower back pain.  ? ? ?Kearstyn would like to discuss possibly getting on birth control. She is undecided on what type she would like.  ?

## 2021-12-02 LAB — GLUCOSE TOLERANCE, 2 HOURS
Glucose, 2 hour: 54 mg/dL — ABNORMAL LOW (ref 70–139)
Glucose, GTT - Fasting: 79 mg/dL (ref 70–99)

## 2021-12-15 ENCOUNTER — Ambulatory Visit: Payer: Medicaid Other

## 2022-01-03 ENCOUNTER — Ambulatory Visit: Payer: Medicaid Other | Attending: Obstetrics & Gynecology

## 2022-02-07 ENCOUNTER — Ambulatory Visit (INDEPENDENT_AMBULATORY_CARE_PROVIDER_SITE_OTHER): Payer: Medicaid Other

## 2022-02-07 ENCOUNTER — Ambulatory Visit
Admission: EM | Admit: 2022-02-07 | Discharge: 2022-02-07 | Disposition: A | Discharge status: Home / Self Care | Payer: Medicaid Other | Attending: Internal Medicine | Admitting: Internal Medicine

## 2022-02-07 DIAGNOSIS — M79672 Pain in left foot: Secondary | ICD-10-CM

## 2022-02-07 DIAGNOSIS — W19XXXA Unspecified fall, initial encounter: Secondary | ICD-10-CM | POA: Diagnosis not present

## 2022-02-07 DIAGNOSIS — W109XXA Fall (on) (from) unspecified stairs and steps, initial encounter: Secondary | ICD-10-CM | POA: Diagnosis not present

## 2022-02-07 NOTE — ED Triage Notes (Signed)
Pt states fell down 4 steps and hitting her head and her lt foot went under her body.

## 2022-04-11 ENCOUNTER — Encounter: Payer: Self-pay | Admitting: Emergency Medicine

## 2022-04-11 ENCOUNTER — Ambulatory Visit
Admission: EM | Admit: 2022-04-11 | Discharge: 2022-04-11 | Disposition: A | Discharge status: Home / Self Care | Payer: Medicaid Other | Attending: Physician Assistant | Admitting: Physician Assistant

## 2022-04-11 DIAGNOSIS — U071 COVID-19: Secondary | ICD-10-CM | POA: Insufficient documentation

## 2022-04-11 DIAGNOSIS — J069 Acute upper respiratory infection, unspecified: Secondary | ICD-10-CM | POA: Insufficient documentation

## 2022-04-11 LAB — SARS CORONAVIRUS 2 (TAT 6-24 HRS): SARS Coronavirus 2: POSITIVE — AB

## 2022-04-11 NOTE — ED Triage Notes (Signed)
Pt is present today with cough, nasal congestion and HA. Pt sx started x2 days ago

## 2022-04-11 NOTE — ED Provider Notes (Signed)
[facility]    CSN: [id number] Arrival date & time: 04/11/22  1218      History   Chief Complaint Chief Complaint  Patient presents with   Cough   Nasal Congestion   Headache    HPI LONI DELBRIDGE is a 36 y.o. female.   Patient here today for evaluation of cough, congestion, and headache that started 2 days ago. She has not had any fever. She reports she has tried using an OTC sinus treatment without improvement. She has not had any nausea, vomiting, or diarrhea.   The history is provided by the patient.  Cough Associated symptoms: headaches and rhinorrhea   Associated symptoms: no chills, no eye discharge and no fever   Headache Associated symptoms: congestion and cough   Associated symptoms: no fever, no nausea and no vomiting     Past Medical History:  Diagnosis Date   Asthma    COVID-19 affecting pregnancy in first trimester 03/24/2021   Late July 2022, (see care everywhere for test)   Fibroid    Fibroids    Gestational diabetes    Headache    Panic attack    last episode approx two months ago per pt    Patient Active Problem List   Diagnosis Date Noted   History of primary cesarean section 09/29/2021   Alpha thalassemia silent carrier 04/18/2021   Low TSH level 03/30/2021   History of COVID-19 03/17/2021   BMI 40.0-44.9, adult ([place]) 11/16/2017    Past Surgical History:  Procedure Laterality Date   CESAREAN SECTION N/A 09/29/2021   Procedure: CESAREAN SECTION;  Surgeon: Aletha Halim, MD;  Location: [facility]S;  Service: Obstetrics;  Laterality: N/A;   OTHER SURGICAL HISTORY     fibroids removed   OVARY SURGERY     cyst removed from ? right ovary    OB History     Gravida  3   Para  3   Term  3   Preterm      AB      Living  3      SAB      IAB      Ectopic      Multiple  0   Live Births  3            Home Medications    Prior to Admission medications   Medication Sig Start Date End Date Taking?  Authorizing Provider  Accu-Chek Softclix Lancets lancets Check blood sugar 4 times a day. Patient not taking: Reported on 10/06/2021 08/03/21   Donnamae Jude, MD  acetaminophen (TYLENOL) 500 MG tablet Take 2 tablets (1,000 mg total) by mouth every 6 (six) hours. Patient not taking: Reported on 12/01/2021 10/02/21   Renard Matter, MD  albuterol (PROVENTIL HFA;VENTOLIN HFA) 108 (90 Base) MCG/ACT inhaler Inhale 2 puffs into the lungs every 4 (four) hours as needed for wheezing or shortness of breath. Patient not taking: Reported on 12/01/2021    [provider]  Blood Glucose Monitoring Suppl (ACCU-CHEK GUIDE) w/Device KIT 1 Device by Does not apply route in the morning, at noon, in the evening, and at bedtime. Patient not taking: Reported on 10/06/2021 08/03/21   Clarnce Flock, MD  Blood Pressure Monitoring (BLOOD PRESSURE KIT) DEVI 1 Device by Does not apply route as needed. Patient not taking: Reported on 10/06/2021 03/17/21   Aletha Halim, MD  ferrous sulfate 325 (65 FE) MG tablet Take 1 tablet (325 mg total) by mouth  every other day. Patient not taking: Reported on 10/06/2021 10/02/21 12/01/21  Renard Matter, MD  glucose blood test strip Check blood sugar 4 times a day. Patient not taking: Reported on 10/06/2021 08/03/21   Donnamae Jude, MD  ibuprofen (ADVIL) 600 MG tablet Take 1 tablet (600 mg total) by mouth every 6 (six) hours. 10/02/21   Renard Matter, MD  norethindrone (MICRONOR) 0.35 MG tablet Take 1 tablet (0.35 mg total) by mouth daily. 12/01/21   Woodroe Mode, MD  Prenatal Vit-Fe Fumarate-FA (PRENATAL MULTIVITAMIN) TABS tablet Take 1 tablet by mouth daily at 12 noon. 10/02/21   Renard Matter, MD    Family History Family History  Problem Relation Age of Onset   Hypertension Mother    Hypertension Father    Diabetes Maternal Aunt    Diabetes Maternal Uncle     Social History Social History   Tobacco Use   Smoking status: Never   Smokeless tobacco: Never  Vaping Use   Vaping  Use: Never used  Substance Use Topics   Alcohol use: Not Currently   Drug use: Not Currently    Types: Marijuana    Comment: as a teenager     Allergies   Bee venom, Other, Tree extract, and Gramineae pollens   Review of Systems Review of Systems  Constitutional:  Negative for chills and fever.  HENT:  Positive for congestion and rhinorrhea.   Eyes:  Negative for discharge and redness.  Respiratory:  Positive for cough.   Gastrointestinal:  Negative for nausea and vomiting.  Neurological:  Positive for headaches.     Physical Exam Triage Vital Signs ED Triage Vitals [04/11/22 1447]  Enc Vitals Group     BP 111/77     Pulse Rate 89     Resp 18     Temp 97.9 F (36.6 C)     Temp Source Oral     SpO2 97 %     Weight      Height      Head Circumference      Peak Flow      Pain Score 0     Pain Loc      Pain Edu?      Excl. in [place]?    No data found.  Updated Vital Signs BP 111/77 (BP Location: Left Arm)   Pulse 89   Temp 97.9 F (36.6 C) (Oral)   Resp 18   SpO2 97%   Breastfeeding Yes      Physical Exam Vitals and nursing note reviewed.  Constitutional:      General: She is not in acute distress.    Appearance: She is well-developed. She is not ill-appearing.  HENT:     Head: Normocephalic and atraumatic.     Nose: Congestion present.  Eyes:     Conjunctiva/sclera: Conjunctivae normal.  Cardiovascular:     Rate and Rhythm: Normal rate and regular rhythm.     Heart sounds: Normal heart sounds. No murmur heard. Pulmonary:     Effort: Pulmonary effort is normal. No respiratory distress.     Breath sounds: Normal breath sounds. No wheezing, rhonchi or rales.  Neurological:     Mental Status: She is alert.  Psychiatric:        Mood and Affect: Mood normal.        Behavior: Behavior normal.      UC Treatments / Results  Labs (all labs ordered are listed, but only abnormal results are displayed) Labs  Reviewed  SARS CORONAVIRUS 2 (TAT 6-24 HRS)     EKG   Radiology No results found.  Procedures Procedures (including critical care time)  Medications Ordered in UC Medications - No data to display  Initial Impression / Assessment and Plan / UC Course  I have reviewed the triage vital signs and the nursing notes.  Pertinent labs & imaging results that were available during my care of the patient were reviewed by me and considered in my medical decision making (see chart for details).    Suspect viral etiology of symptoms- will screen for covid. Recommended follow up if no gradual improvement with any further concerns.   Final Clinical Impressions(s) / UC Diagnoses   Final diagnoses:  Acute upper respiratory infection   Discharge Instructions   None    ED Prescriptions   None    PDMP not reviewed this encounter.   Francene Finders, PA-C 04/11/22 1547

## 2022-04-12 ENCOUNTER — Telehealth (HOSPITAL_COMMUNITY): Payer: Self-pay | Admitting: Emergency Medicine

## 2022-04-12 MED ORDER — ALBUTEROL SULFATE HFA 108 (90 BASE) MCG/ACT IN AERS: 1.0000 | INHALATION_SPRAY | Freq: Four times a day (QID) | RESPIRATORY_TRACT | 0 refills | Status: DC | PRN

## 2022-04-12 MED ORDER — NIRMATRELVIR/RITONAVIR (PAXLOVID)TABLET: 3.0000 | ORAL_TABLET | Freq: Two times a day (BID) | ORAL | 0 refills | Status: AC

## 2022-04-12 NOTE — Telephone Encounter (Signed)
Patient is COVID positive She is requesting antiviral and albuterol.   Reviewed with Dr. Lanny Cramp, okay'd both Upon sending noted patient is breastfeeding, so I reviewed with Wells Guiles, aPP who states okay for breastfeeding.   Reviewed with patient, verified pharmacy, prescription sent

## 2022-09-12 ENCOUNTER — Ambulatory Visit
Admission: EM | Admit: 2022-09-12 | Discharge: 2022-09-12 | Disposition: A | Discharge status: Home / Self Care | Payer: Medicaid Other | Attending: Physician Assistant | Admitting: Physician Assistant

## 2022-09-12 ENCOUNTER — Encounter: Payer: Self-pay | Admitting: *Deleted

## 2022-09-12 ENCOUNTER — Other Ambulatory Visit: Payer: Self-pay

## 2022-09-12 DIAGNOSIS — Z3A00 Weeks of gestation of pregnancy not specified: Secondary | ICD-10-CM

## 2022-09-12 DIAGNOSIS — O26899 Other specified pregnancy related conditions, unspecified trimester: Secondary | ICD-10-CM

## 2022-09-12 DIAGNOSIS — R109 Unspecified abdominal pain: Secondary | ICD-10-CM

## 2022-09-12 DIAGNOSIS — R1031 Right lower quadrant pain: Secondary | ICD-10-CM

## 2022-09-12 LAB — POCT URINALYSIS DIP (MANUAL ENTRY)
Bilirubin, UA: NEGATIVE
Blood, UA: NEGATIVE
Glucose, UA: NEGATIVE mg/dL
Ketones, POC UA: NEGATIVE mg/dL
Leukocytes, UA: NEGATIVE
Nitrite, UA: NEGATIVE
Protein Ur, POC: NEGATIVE mg/dL
Spec Grav, UA: 1.025 (ref 1.010–1.025)
Urobilinogen, UA: 1 E.U./dL
pH, UA: 6.5 (ref 5.0–8.0)

## 2022-09-12 LAB — POCT URINE PREGNANCY: Preg Test, Ur: POSITIVE — AB

## 2022-09-12 LAB — POCT FASTING CBG KUC MANUAL ENTRY: POCT Glucose (KUC): 84 mg/dL (ref 70–99)

## 2022-09-12 NOTE — ED Provider Notes (Signed)
[facility]    CSN: [id number] Arrival date & time: 09/12/22  1648      History   Chief Complaint Chief Complaint  Patient presents with   Groin Pain    HPI Sandra Krueger is a 37 y.o. female.   Patient here today for evaluation of pain to her right lower abdomen/ groin that started about 2 weeks ago and has been worsening over the last few days. She denies any vomiting or nausea. She has been waking up with the shakes and this was what occurred when she had gestational diabetes in the past. She would like to have glucose.   The history is provided by the patient.    Past Medical History:  Diagnosis Date   Asthma    COVID-19 affecting pregnancy in first trimester 03/24/2021   Late July 2022, (see care everywhere for test)   Fibroid    Fibroids    Gestational diabetes    Headache    Panic attack    last episode approx two months ago per pt    Patient Active Problem List   Diagnosis Date Noted   History of primary cesarean section 09/29/2021   Alpha thalassemia silent carrier 04/18/2021   Low TSH level 03/30/2021   History of COVID-19 03/17/2021   BMI 40.0-44.9, adult ([place]) 11/16/2017    Past Surgical History:  Procedure Laterality Date   CESAREAN SECTION N/A 09/29/2021   Procedure: CESAREAN SECTION;  Surgeon: Aletha Halim, MD;  Location: [facility];  Service: Obstetrics;  Laterality: N/A;   OTHER SURGICAL HISTORY     fibroids removed   OVARY SURGERY     cyst removed from ? right ovary    OB History     Gravida  3   Para  3   Term  3   Preterm      AB      Living  3      SAB      IAB      Ectopic      Multiple  0   Live Births  3            Home Medications    Prior to Admission medications   Medication Sig Start Date End Date Taking? Authorizing Provider  Accu-Chek Softclix Lancets lancets Check blood sugar 4 times a day. Patient not taking: Reported on 10/06/2021 08/03/21   Donnamae Jude, MD  acetaminophen  (TYLENOL) 500 MG tablet Take 2 tablets (1,000 mg total) by mouth every 6 (six) hours. Patient not taking: Reported on 12/01/2021 10/02/21   Renard Matter, MD  albuterol (PROVENTIL HFA;VENTOLIN HFA) 108 (90 Base) MCG/ACT inhaler Inhale 2 puffs into the lungs every 4 (four) hours as needed for wheezing or shortness of breath. Patient not taking: Reported on 12/01/2021    [provider]  albuterol (VENTOLIN HFA) 108 (90 Base) MCG/ACT inhaler Inhale 1-2 puffs into the lungs every 6 (six) hours as needed for wheezing or shortness of breath. 04/12/22   LampteyMyrene Galas, MD  Blood Glucose Monitoring Suppl (ACCU-CHEK GUIDE) w/Device KIT 1 Device by Does not apply route in the morning, at noon, in the evening, and at bedtime. Patient not taking: Reported on 10/06/2021 08/03/21   Clarnce Flock, MD  Blood Pressure Monitoring (BLOOD PRESSURE KIT) DEVI 1 Device by Does not apply route as needed. Patient not taking: Reported on 10/06/2021 03/17/21   Aletha Halim, MD  ferrous sulfate 325 (65 FE) MG tablet  Take 1 tablet (325 mg total) by mouth every other day. Patient not taking: Reported on 10/06/2021 10/02/21 12/01/21  Renard Matter, MD  glucose blood test strip Check blood sugar 4 times a day. Patient not taking: Reported on 10/06/2021 08/03/21   Donnamae Jude, MD  ibuprofen (ADVIL) 600 MG tablet Take 1 tablet (600 mg total) by mouth every 6 (six) hours. 10/02/21   Renard Matter, MD  norethindrone (MICRONOR) 0.35 MG tablet Take 1 tablet (0.35 mg total) by mouth daily. 12/01/21   Woodroe Mode, MD  Prenatal Vit-Fe Fumarate-FA (PRENATAL MULTIVITAMIN) TABS tablet Take 1 tablet by mouth daily at 12 noon. 10/02/21   Renard Matter, MD    Family History Family History  Problem Relation Age of Onset   Hypertension Mother    Hypertension Father    Diabetes Maternal Aunt    Diabetes Maternal Uncle     Social History Social History   Tobacco Use   Smoking status: Never   Smokeless tobacco: Never  Vaping Use    Vaping Use: Never used  Substance Use Topics   Alcohol use: Not Currently   Drug use: Not Currently    Types: Marijuana    Comment: as a teenager     Allergies   Bee venom, Other, Tree extract, and Gramineae pollens   Review of Systems Review of Systems  Constitutional:  Negative for chills and fever.  Eyes:  Negative for discharge and redness.  Respiratory:  Negative for shortness of breath.   Gastrointestinal:  Positive for abdominal pain. Negative for nausea and vomiting.     Physical Exam Triage Vital Signs ED Triage Vitals  Enc Vitals Group     BP      Pulse      Resp      Temp      Temp src      SpO2      Weight      Height      Head Circumference      Peak Flow      Pain Score      Pain Loc      Pain Edu?      Excl. in [place]?    No data found.  Updated Vital Signs BP 126/82   Pulse 97   Temp 98.1 F (36.7 C)   Resp 20   LMP 08/07/2022   SpO2 98%   Breastfeeding No      Physical Exam Vitals and nursing note reviewed.  Constitutional:      General: She is not in acute distress.    Appearance: Normal appearance. She is not ill-appearing.  HENT:     Head: Normocephalic and atraumatic.  Eyes:     Conjunctiva/sclera: Conjunctivae normal.  Cardiovascular:     Rate and Rhythm: Normal rate.  Pulmonary:     Effort: Pulmonary effort is normal. No respiratory distress.  Neurological:     Mental Status: She is alert.  Psychiatric:        Mood and Affect: Mood normal.        Behavior: Behavior normal.        Thought Content: Thought content normal.      UC Treatments / Results  Labs (all labs ordered are listed, but only abnormal results are displayed) Labs Reviewed  POCT URINE PREGNANCY - Abnormal; Notable for the following components:      Result Value   Preg Test, Ur Positive (*)    All other components within  normal limits  POCT FASTING CBG [facility] MANUAL ENTRY  POCT URINALYSIS DIP (MANUAL ENTRY)    EKG   Radiology No results  found.  Procedures Procedures (including critical care time)  Medications Ordered in UC Medications - No data to display  Initial Impression / Assessment and Plan / UC Course  I have reviewed the triage vital signs and the nursing notes.  Pertinent labs & imaging results that were available during my care of the patient were reviewed by me and considered in my medical decision making (see chart for details).    Urine pregnancy positive- recommended further evaluation in women's ED for ultrasound to rule out ectopic pregnancy. Patient is agreeable to same.   Final Clinical Impressions(s) / UC Diagnoses   Final diagnoses:  Right inguinal pain  Abdominal pain during pregnancy, antepartum     Discharge Instructions       Please report to ED immediately for further work up/ imaging.      ED Prescriptions   None    PDMP not reviewed this encounter.   Francene Finders, PA-C 09/12/22 1827

## 2022-09-12 NOTE — ED Triage Notes (Signed)
Pt reports pain in Rt groin . Pt also has been waking up with the shakes . Pt concern beacuse of gestaional  DM. Wants CBG done.

## 2022-09-12 NOTE — Discharge Instructions (Signed)
  Please report to ED immediately for further work up/ imaging.

## 2022-09-13 ENCOUNTER — Inpatient Hospital Stay (HOSPITAL_COMMUNITY): Payer: Medicaid Other

## 2022-09-13 ENCOUNTER — Encounter (HOSPITAL_COMMUNITY): Payer: Self-pay

## 2022-09-13 ENCOUNTER — Inpatient Hospital Stay (HOSPITAL_COMMUNITY)
Admission: AD | Admit: 2022-09-13 | Discharge: 2022-09-13 | Disposition: A | Discharge status: Home / Self Care | Payer: Medicaid Other | Attending: Obstetrics and Gynecology | Admitting: Obstetrics and Gynecology

## 2022-09-13 DIAGNOSIS — O3680X0 Pregnancy with inconclusive fetal viability, not applicable or unspecified: Secondary | ICD-10-CM | POA: Diagnosis not present

## 2022-09-13 DIAGNOSIS — R109 Unspecified abdominal pain: Secondary | ICD-10-CM | POA: Diagnosis not present

## 2022-09-13 DIAGNOSIS — R102 Pelvic and perineal pain: Secondary | ICD-10-CM | POA: Diagnosis present

## 2022-09-13 DIAGNOSIS — Z3A01 Less than 8 weeks gestation of pregnancy: Secondary | ICD-10-CM | POA: Insufficient documentation

## 2022-09-13 DIAGNOSIS — O26891 Other specified pregnancy related conditions, first trimester: Secondary | ICD-10-CM | POA: Diagnosis not present

## 2022-09-13 LAB — WET PREP, GENITAL
Sperm: NONE SEEN
Trich, Wet Prep: NONE SEEN
WBC, Wet Prep HPF POC: <10 (ref <10)
Yeast Wet Prep HPF POC: NONE SEEN

## 2022-09-13 LAB — COMPREHENSIVE METABOLIC PANEL
ALT: 14 U/L (ref 0–44)
AST: 18 U/L (ref 15–41)
Albumin: 3.7 g/dL (ref 3.5–5.0)
Alkaline Phosphatase: 49 U/L (ref 38–126)
Anion gap: 7 (ref 5–15)
BUN: 6 mg/dL (ref 6–20)
CO2: 25 mmol/L (ref 22–32)
Calcium: 8.6 mg/dL — ABNORMAL LOW (ref 8.9–10.3)
Chloride: 104 mmol/L (ref 98–111)
Creatinine, Ser: 0.71 mg/dL (ref 0.44–1.00)
GFR, Estimated: >60 mL/min (ref >60)
Glucose, Bld: 93 mg/dL (ref 70–99)
Potassium: 3.5 mmol/L (ref 3.5–5.1)
Sodium: 136 mmol/L (ref 135–145)
Total Bilirubin: 0.6 mg/dL (ref 0.3–1.2)
Total Protein: 7.8 g/dL (ref 6.5–8.1)

## 2022-09-13 LAB — URINALYSIS, ROUTINE W REFLEX MICROSCOPIC
Bilirubin Urine: NEGATIVE
Glucose, UA: NEGATIVE mg/dL
Hgb urine dipstick: NEGATIVE
Ketones, ur: NEGATIVE mg/dL
Leukocytes,Ua: NEGATIVE
Nitrite: NEGATIVE
Protein, ur: NEGATIVE mg/dL
Specific Gravity, Urine: 1.01 (ref 1.005–1.030)
pH: 7 (ref 5.0–8.0)

## 2022-09-13 LAB — CBC
HCT: 37.2 % (ref 36.0–46.0)
Hemoglobin: 11.4 g/dL — ABNORMAL LOW (ref 12.0–15.0)
MCH: 25.6 pg — ABNORMAL LOW (ref 26.0–34.0)
MCHC: 30.6 g/dL (ref 30.0–36.0)
MCV: 83.4 fL (ref 80.0–100.0)
Platelets: 310 10*3/uL (ref 150–400)
RBC: 4.46 MIL/uL (ref 3.87–5.11)
RDW: 14.6 % (ref 11.5–15.5)
WBC: 4.6 10*3/uL (ref 4.0–10.5)
nRBC: 0 % (ref 0.0–0.2)

## 2022-09-13 LAB — HCG, QUANTITATIVE, PREGNANCY: hCG, Beta Chain, Quant, S: 696 m[IU]/mL — ABNORMAL HIGH (ref <5)

## 2022-09-13 NOTE — Discharge Instructions (Signed)
You were seen in the MAU for abdominal pain. We did blood tests and an ultrasound. You are pregnant, but at present we are not sure if you have an ectopic pregnancy, a normal pregnancy, or a miscarriage. To determine this you will need to have a repeat test of your pregnancy hormone level done in two days. We will schedule you an appointment at the [facility] on [address] to have this repeat test done, please go there on Friday morning before 10 AM to have your blood drawn. Someone will contact you with the results later that day.   If you develop severe and progressively worsening abdominal pain, heavy vaginal bleeding soaking >1 pad/hour, or fever, return to the MAU or the nearest hospital immediately.

## 2022-09-13 NOTE — MAU Note (Signed)
Sandra Krueger is a 37 y.o. at 62w2dhere in MAU reporting: has been having right lower abdominal pain for the past couple of days. States it has been getting worse.   LMP: 08/07/22 approximately  Onset of complaint: ongoing  Pain score: 7/10  Vitals:   09/13/22 0935  BP: 114/67  Pulse: 76  Resp: 16  Temp: 98.5 F (36.9 C)  SpO2: 99%     FHT:NA  Lab orders placed from triage: UA

## 2022-09-13 NOTE — MAU Provider Note (Signed)
History     [id number]  Arrival date and time: 09/13/22 8242    Chief Complaint  Patient presents with   Abdominal Pain     HPI Sandra Krueger is a 37 y.o. at 32w2dby LMP with PMHx notable for one prior cesarean for breech presentation, GDM, who presents for +UPT and abdominal pain.   Patient seen at urgent care yesterday, reported R lower pelvic pain for past two weeks, had positive UPT, referred to MAU  Today reports on going pain on the R side No vaginal bleeding or discharge No burning or pain with urination No fevers   --/--/O POS Performed at [facility] [address] [place] [address] (01/31 1004)  OB History     Gravida  4   Para  3   Term  3   Preterm      AB      Living  3      SAB      IAB      Ectopic      Multiple  0   Live Births  3           Past Medical History:  Diagnosis Date   Asthma    COVID-19 affecting pregnancy in first trimester 03/24/2021   Late July 2022, (see care everywhere for test)   Fibroid    Fibroids    Gestational diabetes    Headache    Panic attack    last episode approx two months ago per pt    Past Surgical History:  Procedure Laterality Date   CESAREAN SECTION N/A 09/29/2021   Procedure: CESAREAN SECTION;  Surgeon: PAletha Halim MD;  Location: [facility] LD ORS;  Service: Obstetrics;  Laterality: N/A;   OTHER SURGICAL HISTORY     fibroids removed   OVARY SURGERY     cyst removed from ? right ovary    Family History  Problem Relation Age of Onset   Hypertension Mother    Hypertension Father    Diabetes Maternal Aunt    Diabetes Maternal Uncle     Social History   Socioeconomic History   Marital status: Single    Spouse name: Not on file   Number of children: 1   Years of education: Not on file   Highest education level: Not on file  Occupational History   Not on file  Tobacco Use   Smoking status: Never   Smokeless tobacco: Never  Vaping Use   Vaping Use: Never  used  Substance and Sexual Activity   Alcohol use: Not Currently   Drug use: Not Currently    Types: Marijuana    Comment: as a teenager   Sexual activity: Yes    Birth control/protection: None  Other Topics Concern   Not on file  Social History Narrative   Not on file   Social Determinants of Health   Financial Resource Strain: Low Risk  (06/29/2018)   Overall Financial Resource Strain (CARDIA)    Difficulty of Paying Living Expenses: Not very hard  Food Insecurity: No Food Insecurity (04/15/2021)   Hunger Vital Sign    Worried About Running Out of Food in the Last Year: Never true    RMuirin the Last Year: Never true  Transportation Needs: No Transportation Needs (04/15/2021)   PRAPARE - THydrologist(Medical): No    Lack of Transportation (Non-Medical): No  Physical Activity: Inactive (06/29/2018)  Exercise Vital Sign    Days of Exercise per Week: 0 days    Minutes of Exercise per Session: 0 min  Stress: No Stress Concern Present (06/29/2018)   [place]    Feeling of Stress : Not at all  Social Connections: Moderately Integrated (06/29/2018)   Social Connection and Isolation Panel [NHANES]    Frequency of Communication with Friends and Family: Three times a week    Frequency of Social Gatherings with Friends and Family: Three times a week    Attends Religious Services: More than 4 times per year    Active Member of Clubs or Organizations: Yes    Attends Archivist Meetings: 1 to 4 times per year    Marital Status: Never married  Intimate Partner Violence: Not on file    Allergies  Allergen Reactions   Bee Venom Anaphylaxis   Other Anaphylaxis    All nuts   Tree Extract Anaphylaxis   Gramineae Pollens Rash    No current facility-administered medications on file prior to encounter.   Current Outpatient Medications on File Prior to Encounter   Medication Sig Dispense Refill   Accu-Chek Softclix Lancets lancets Check blood sugar 4 times a day. (Patient not taking: Reported on 10/06/2021) 100 each 12   acetaminophen (TYLENOL) 500 MG tablet Take 2 tablets (1,000 mg total) by mouth every 6 (six) hours. (Patient not taking: Reported on 12/01/2021) 30 tablet 0   albuterol (PROVENTIL HFA;VENTOLIN HFA) 108 (90 Base) MCG/ACT inhaler Inhale 2 puffs into the lungs every 4 (four) hours as needed for wheezing or shortness of breath. (Patient not taking: Reported on 12/01/2021)     albuterol (VENTOLIN HFA) 108 (90 Base) MCG/ACT inhaler Inhale 1-2 puffs into the lungs every 6 (six) hours as needed for wheezing or shortness of breath. 1 each 0   Blood Glucose Monitoring Suppl (ACCU-CHEK GUIDE) w/Device KIT 1 Device by Does not apply route in the morning, at noon, in the evening, and at bedtime. (Patient not taking: Reported on 10/06/2021) 1 kit 0   Blood Pressure Monitoring (BLOOD PRESSURE KIT) DEVI 1 Device by Does not apply route as needed. (Patient not taking: Reported on 10/06/2021) 1 each 0   ferrous sulfate 325 (65 FE) MG tablet Take 1 tablet (325 mg total) by mouth every other day. (Patient not taking: Reported on 10/06/2021) 30 tablet 0   glucose blood test strip Check blood sugar 4 times a day. (Patient not taking: Reported on 10/06/2021) 100 each 12   ibuprofen (ADVIL) 600 MG tablet Take 1 tablet (600 mg total) by mouth every 6 (six) hours. 30 tablet 0   norethindrone (MICRONOR) 0.35 MG tablet Take 1 tablet (0.35 mg total) by mouth daily. 28 tablet 11   Prenatal Vit-Fe Fumarate-FA (PRENATAL MULTIVITAMIN) TABS tablet Take 1 tablet by mouth daily at 12 noon. 60 tablet 0     ROS Pertinent positives and negative per HPI, all others reviewed and negative  Physical Exam   BP 114/67 (BP Location: Right Arm)   Pulse 76   Temp 98.5 F (36.9 C) (Oral)   Resp 16   Ht '5\' 4"'$  (1.626 m)   Wt 116.3 kg   LMP 08/07/2022 (Approximate)   SpO2 99% Comment:  room air  BMI 44.01 kg/m   Patient Vitals for the past 24 hrs:  BP Temp Temp src Pulse Resp SpO2 Height Weight  09/13/22 0935 114/67 98.5 F (36.9 C) Oral 76 16  99 % -- --  09/13/22 0931 -- -- -- -- -- -- '5\' 4"'$  (1.626 m) 116.3 kg    Physical Exam Vitals reviewed.  Constitutional:      General: She is not in acute distress.    Appearance: She is well-developed. She is not diaphoretic.  Eyes:     General: No scleral icterus. Pulmonary:     Effort: Pulmonary effort is normal. No respiratory distress.  Abdominal:     General: There is no distension.     Palpations: Abdomen is soft.     Tenderness: There is no abdominal tenderness. There is no guarding or rebound.  Skin:    General: Skin is warm and dry.  Neurological:     Mental Status: She is alert.     Coordination: Coordination normal.      Cervical Exam    Bedside Ultrasound Pt informed that the ultrasound is considered a limited OB ultrasound and is not intended to be a complete ultrasound exam.  Patient also informed that the ultrasound is not being completed with the intent of assessing for fetal or placental anomalies or any pelvic abnormalities.  Explained that the purpose of today's ultrasound is to assess for  viability.  Patient acknowledges the purpose of the exam and the limitations of the study.    My interpretation: no IUP seen, fibroid uterus, no adnexal massess appreciated   Labs Results for orders placed or performed during the hospital encounter of 09/13/22 (from the past 24 hour(s))  Wet prep, genital     Status: Abnormal   Collection Time: 09/13/22  9:57 AM  Result Value Ref Range   Yeast Wet Prep HPF POC NONE SEEN NONE SEEN   Trich, Wet Prep NONE SEEN NONE SEEN   Clue Cells Wet Prep HPF POC PRESENT (A) NONE SEEN   WBC, Wet Prep HPF POC <10 <10   Sperm NONE SEEN   ABO/Rh     Status: None   Collection Time: 09/13/22 10:04 AM  Result Value Ref Range   ABO/RH(D)      O POS Performed at [facility] [address] [place], [place] [address]   CBC     Status: Abnormal   Collection Time: 09/13/22 10:05 AM  Result Value Ref Range   WBC 4.6 4.0 - 10.5 K/uL   RBC 4.46 3.87 - 5.11 MIL/uL   Hemoglobin 11.4 (L) 12.0 - 15.0 g/dL   HCT 37.2 36.0 - 46.0 %   MCV 83.4 80.0 - 100.0 fL   MCH 25.6 (L) 26.0 - 34.0 pg   MCHC 30.6 30.0 - 36.0 g/dL   RDW 14.6 11.5 - 15.5 %   Platelets 310 150 - 400 K/uL   nRBC 0.0 0.0 - 0.2 %  Comprehensive metabolic panel     Status: Abnormal   Collection Time: 09/13/22 10:05 AM  Result Value Ref Range   Sodium 136 135 - 145 mmol/L   Potassium 3.5 3.5 - 5.1 mmol/L   Chloride 104 98 - 111 mmol/L   CO2 25 22 - 32 mmol/L   Glucose, Bld 93 70 - 99 mg/dL   BUN 6 6 - 20 mg/dL   Creatinine, Ser 0.71 0.44 - 1.00 mg/dL   Calcium 8.6 (L) 8.9 - 10.3 mg/dL   Total Protein 7.8 6.5 - 8.1 g/dL   Albumin 3.7 3.5 - 5.0 g/dL   AST 18 15 - 41 U/L   ALT 14 0 - 44 U/L   Alkaline  Phosphatase 49 38 - 126 U/L   Total Bilirubin 0.6 0.3 - 1.2 mg/dL   GFR, Estimated >60 >60 mL/min   Anion gap 7 5 - 15  hCG, quantitative, pregnancy     Status: Abnormal   Collection Time: 09/13/22 10:05 AM  Result Value Ref Range   hCG, Beta Chain, Quant, S 696 (H) <5 mIU/mL    Imaging US OB LESS THAN 14 WEEKS WITH OB TRANSVAGINAL  Result Date: 09/13/2022 CLINICAL DATA:  Pelvic pain. EXAM: OBSTETRIC <14 WK [place] AND TRANSVAGINAL OB US TECHNIQUE: Both transabdominal and transvaginal ultrasound examinations were performed for complete evaluation of the gestation as well as the maternal uterus, adnexal regions, and pelvic cul-de-sac. Transvaginal technique was performed to assess early pregnancy. COMPARISON:  None Available. FINDINGS: Intrauterine gestational sac: None Yolk sac:  N/A Embryo:  N/A Cardiac Activity: N/A Heart Rate: N/A bpm Subchorionic hemorrhage:  N/A Maternal uterus/adnexae: 3 cm uterine fibroid noted. The ovaries are normal. No adnexal mass. No free pelvic fluid collections.  IMPRESSION: No intrauterine gestational sac is identified. No adnexal mass or free pelvic fluid. Electronically Signed   By: Marijo Sanes M.D.   On: 09/13/2022 10:30    MAU Course  Procedures Lab Orders         Wet prep, genital         Urinalysis, Routine w reflex microscopic -Urine, Clean Catch         CBC         Comprehensive metabolic panel         hCG, quantitative, pregnancy    No orders of the defined types were placed in this encounter.  Imaging Orders         US OB LESS THAN 14 WEEKS WITH OB TRANSVAGINAL     MDM moderate  Assessment and Plan  #Pregnancy of unknown location #Abdominal pain in pregnancy, first trimester #[redacted] weeks gestation of pregnancy Pregnancy of unknown location. Discussed with patient that the possibilities include ectopic pregnancy, normal pregnancy, or miscarriage. We discussed need to trend HCG to help determine the outcome of this pregnancy. Scheduled for repeat HCG at [facility] in two days. Emphasized that ectopic pregnancy is a life threatening condition if not diagnosed and treated in a timely manner. Reviewed MAU return precautions of severe and progressively worsening abdominal pain, heavy vaginal bleeding soaking >1 pad/hour, or fever.   Dispo: discharged to home in stable condition.    Clarnce Flock, MD/MPH 09/13/22 11:44 AM  Allergies as of 09/13/2022       Reactions   Bee Venom Anaphylaxis   Other Anaphylaxis   All nuts   Tree Extract Anaphylaxis   Gramineae Pollens Rash        Medication List     STOP taking these medications    Accu-Chek Guide w/Device Kit   Accu-Chek Softclix Lancets lancets   Blood Pressure Kit Devi   ferrous sulfate 325 (65 FE) MG tablet   glucose blood test strip   ibuprofen 600 MG tablet Commonly known as: ADVIL   norethindrone 0.35 MG tablet Commonly known as: MICRONOR       TAKE these medications    acetaminophen 500 MG tablet Commonly known as: TYLENOL Take 2  tablets (1,000 mg total) by mouth every 6 (six) hours.   albuterol 108 (90 Base) MCG/ACT inhaler Commonly known as: VENTOLIN HFA Inhale 2 puffs into the lungs every 4 (four) hours as needed for wheezing or shortness of breath.  albuterol 108 (90 Base) MCG/ACT inhaler Commonly known as: VENTOLIN HFA Inhale 1-2 puffs into the lungs every 6 (six) hours as needed for wheezing or shortness of breath.   prenatal multivitamin Tabs tablet Take 1 tablet by mouth daily at 12 noon.

## 2022-09-14 LAB — GC/CHLAMYDIA PROBE AMP (~~LOC~~) NOT AT ARMC
Chlamydia: NEGATIVE
Comment: NEGATIVE
Comment: NORMAL
Neisseria Gonorrhea: NEGATIVE

## 2022-09-14 LAB — ABO/RH: ABO/RH(D): O POS

## 2022-09-15 ENCOUNTER — Ambulatory Visit (INDEPENDENT_AMBULATORY_CARE_PROVIDER_SITE_OTHER): Payer: Medicaid Other

## 2022-09-15 DIAGNOSIS — R109 Unspecified abdominal pain: Secondary | ICD-10-CM

## 2022-09-15 DIAGNOSIS — Z3A01 Less than 8 weeks gestation of pregnancy: Secondary | ICD-10-CM

## 2022-09-15 DIAGNOSIS — O26891 Other specified pregnancy related conditions, first trimester: Secondary | ICD-10-CM

## 2022-09-15 LAB — BETA HCG QUANT (REF LAB): hCG Quant: 919 m[IU]/mL

## 2022-09-15 NOTE — Progress Notes (Signed)
Beta HCG Follow-up Visit  Sandra Krueger presents to [facility] for follow-up beta HCG lab. She was seen in MAU for abdominal pain on 09/13/2022. Patient intermittent R lower abdominal  pain today. Discussed with patient that we are following beta HCG levels today. Results will be back in approximately 2 hours. Valid contact number for patient confirmed. I will call the patient with results.   Beta HCG results: 09/13/2022 696  09/15/2022 919      Results and patient history reviewed with C. Pickens MD, who states follow up Beta HCG level on Sunday (02/04) . Patient called and informed of plan for follow-up.  Martina Sinner 09/15/2022 8:45 AM

## 2022-09-17 ENCOUNTER — Other Ambulatory Visit: Payer: Self-pay

## 2022-09-17 ENCOUNTER — Inpatient Hospital Stay (HOSPITAL_COMMUNITY)
Admission: AD | Admit: 2022-09-17 | Discharge: 2022-09-17 | Disposition: A | Discharge status: Home / Self Care | Payer: Medicaid Other | Attending: Obstetrics and Gynecology | Admitting: Obstetrics and Gynecology

## 2022-09-17 ENCOUNTER — Encounter (HOSPITAL_COMMUNITY): Payer: Self-pay | Admitting: Obstetrics and Gynecology

## 2022-09-17 DIAGNOSIS — O3680X0 Pregnancy with inconclusive fetal viability, not applicable or unspecified: Secondary | ICD-10-CM

## 2022-09-17 DIAGNOSIS — Z3A01 Less than 8 weeks gestation of pregnancy: Secondary | ICD-10-CM | POA: Insufficient documentation

## 2022-09-17 LAB — HCG, QUANTITATIVE, PREGNANCY: hCG, Beta Chain, Quant, S: 1434 m[IU]/mL — ABNORMAL HIGH (ref <5)

## 2022-09-17 NOTE — MAU Provider Note (Signed)
History   Chief Complaint:  HCG Level   Sandra Krueger is a 37 y.o. G4P3003 at 73w6dwho presents for labs.  Was seen in MAU last week for abdominal pain & is following HCGs for pregnancy of unknown location. Currently denies abdominal pain or vaginal bleeding.   Physical Exam   Blood pressure 124/66, pulse 86, temperature 98.3 F (36.8 C), temperature source Oral, resp. rate 18, height '5\' 4"'$  (1.626 m), weight 116.5 kg, last menstrual period 08/07/2022, SpO2 100 %, not currently breastfeeding.  Physical Examination: General appearance - alert, well appearing, and in no distress Mental status - normal mood, behavior, speech, dress, motor activity, and thought processes Eyes - pupils equal and reactive, extraocular eye movements intact, sclera anicteric Chest - normal respiratory effort  Labs: Results for orders placed or performed during the hospital encounter of 09/17/22 (from the past 24 hour(s))  hCG, quantitative, pregnancy   Collection Time: 09/17/22  4:36 PM  Result Value Ref Range   hCG, Beta Chain, Quant, S 1,434 (H) <5 mIU/mL    Ultrasound Studies:   No results found.  Assessment:   1. Pregnancy of unknown anatomic location   2. [redacted] weeks gestation of pregnancy     -1/31=696, 2/2=919, 2/4=1434 -Hcg continues to rise about 50%. Patient is asymptomatic. RH positive. Reviewed with Dr. BElgie Congo will get another HCG - may consider MTX on Wednesday if HCG doesn't rise appropriately.    Plan: -Discharge home in stable condition -Ectopic precautions discussed -[facility] -Patient may return to MAU as needed or if her condition were to change or worsen  EJorje Guild NP 09/17/2022, 6:40 PM

## 2022-09-17 NOTE — Discharge Instructions (Signed)
Return to care  If you have heavier bleeding that soaks through more than 2 pads per hour for an hour or more If you bleed so much that you feel like you might pass out or you do pass out If you have significant abdominal pain that is not improved with Tylenol   

## 2022-09-17 NOTE — MAU Note (Signed)
Sandra Krueger is a 37 y.o. at 43w6dhere in MAU reporting: for repeat HCG level.  Denies VB and pain LMP: NA Onset of complaint: last wek Pain score: 0 Vitals:   09/17/22 1628  BP: 124/66  Pulse: 86  Resp: 18  Temp: 98.3 F (36.8 C)  SpO2: 100%     FHT:NA Lab orders placed from triage:   HCG Level

## 2022-09-20 ENCOUNTER — Ambulatory Visit (INDEPENDENT_AMBULATORY_CARE_PROVIDER_SITE_OTHER): Payer: Medicaid Other

## 2022-09-20 DIAGNOSIS — O3680X0 Pregnancy with inconclusive fetal viability, not applicable or unspecified: Secondary | ICD-10-CM | POA: Diagnosis not present

## 2022-09-20 DIAGNOSIS — Z3A01 Less than 8 weeks gestation of pregnancy: Secondary | ICD-10-CM

## 2022-09-20 LAB — BETA HCG QUANT (REF LAB): hCG Quant: 1880 m[IU]/mL

## 2022-09-20 NOTE — Progress Notes (Signed)
Pt here today for STAT Beta s/p pregnancy of unknown location.  Pt denies vaginal bleeding and pain.  Pt advised that I will call her around lunch time with results and f/u.  Pt verbalized understanding with no further quesitons.   Received results from lab resulting 1880.  Per Dr. Ninetta Lights is doubled. Reassuring. Recommend viability [place] for about 7-8 weeks, Tuesday at [facility] would be ideal.  Pt notified of providers recommendation.  Pt requests to have her U/S appt scheduled sooner as she is 6w 2d today.  Pt agreed to U/S scheduled on 10/10/22 @ 1030.  Pt advised when to go to MAU for evaluation.  Pt verbalized understanding with no further questions.    Frances Nickels  09/20/22

## 2022-09-21 ENCOUNTER — Telehealth: Payer: Self-pay

## 2022-09-21 NOTE — Telephone Encounter (Addendum)
Patient called nurse line reporting that she is experiencing pain that she believes could be related to her current pregnancy. Patient recently discovered she was pregnant on 09/12/22 and reports that she originally had complications with pain in her lower right side, that has now radiated to a pain in her "top right kidney" area of her back.   Called patient at 1345 at number listed in message--[phone]. Patient reports that she originally experienced pain in her right lower back around 09/12/22 prior to finding out she was pregnant. Patient went to urgent care on the same day where she discovered she is pregnant--u/a was also completed at this visit and results were wnl. Patient stated that the pain subsided for 2 days after that visit.  According to patient, pain has returned, but now in the right upper area of her back and is moderate. Patient denies vaginal bleeding, fever, changes with urination frequency, pain with urination, and hematuria. Patient has not tried any medication for pain, however reported that increasing her water intake and taking a "cranberry pill" has helped subside the pain some. Patient does not have Hx of UTI's   Consulted another RN with patient findings Eden Emms, RN)--agreed that we could offer patient option to leave urine sample tomorrow morning so we could test for uti.   Called patient again at 1410; informed patient that with moderate pain being only symptom she is experiencing so far, it is hard to determine exact cause. Re-instructed patient to seek emergent care at hospital/MAU if patient experiences severe pain and vaginal bleeding (especially heavy). Offered patient option to leave urine sample in office tomorrow morning so that we could run u/a and possibly a urine culture. Also offered nurse visit appointment for Monday if patient was interested. Patient declined all appointments offered as well as option to leave urine sample in office for testing, and stated that  she would "give it more time." Informed patient to reach out to office if new symptoms arise or if current symptoms worsen, and reiterated when to seek emergent care for ectopic protocols. Patient verbalized understanding and had no further questions.

## 2022-09-22 ENCOUNTER — Inpatient Hospital Stay (HOSPITAL_COMMUNITY): Payer: Medicaid Other

## 2022-09-22 ENCOUNTER — Inpatient Hospital Stay (HOSPITAL_COMMUNITY)
Admission: AD | Admit: 2022-09-22 | Discharge: 2022-09-22 | Disposition: A | Discharge status: Home / Self Care | Payer: Medicaid Other | Source: Home / Self Care | Attending: Obstetrics and Gynecology | Admitting: Obstetrics and Gynecology

## 2022-09-22 ENCOUNTER — Inpatient Hospital Stay (HOSPITAL_COMMUNITY)
Admission: AD | Admit: 2022-09-22 | Discharge: 2022-09-22 | Disposition: A | Discharge status: Home / Self Care | Payer: Medicaid Other | Attending: Obstetrics and Gynecology | Admitting: Obstetrics and Gynecology

## 2022-09-22 DIAGNOSIS — O039 Complete or unspecified spontaneous abortion without complication: Secondary | ICD-10-CM

## 2022-09-22 DIAGNOSIS — O26891 Other specified pregnancy related conditions, first trimester: Secondary | ICD-10-CM | POA: Insufficient documentation

## 2022-09-22 DIAGNOSIS — O09521 Supervision of elderly multigravida, first trimester: Secondary | ICD-10-CM | POA: Insufficient documentation

## 2022-09-22 DIAGNOSIS — Z7951 Long term (current) use of inhaled steroids: Secondary | ICD-10-CM | POA: Insufficient documentation

## 2022-09-22 DIAGNOSIS — O3680X0 Pregnancy with inconclusive fetal viability, not applicable or unspecified: Secondary | ICD-10-CM | POA: Diagnosis not present

## 2022-09-22 DIAGNOSIS — Z3A01 Less than 8 weeks gestation of pregnancy: Secondary | ICD-10-CM | POA: Insufficient documentation

## 2022-09-22 DIAGNOSIS — D259 Leiomyoma of uterus, unspecified: Secondary | ICD-10-CM | POA: Insufficient documentation

## 2022-09-22 DIAGNOSIS — O3411 Maternal care for benign tumor of corpus uteri, first trimester: Secondary | ICD-10-CM | POA: Insufficient documentation

## 2022-09-22 DIAGNOSIS — Z8616 Personal history of COVID-19: Secondary | ICD-10-CM | POA: Insufficient documentation

## 2022-09-22 DIAGNOSIS — J45909 Unspecified asthma, uncomplicated: Secondary | ICD-10-CM | POA: Diagnosis not present

## 2022-09-22 DIAGNOSIS — R1030 Lower abdominal pain, unspecified: Secondary | ICD-10-CM | POA: Insufficient documentation

## 2022-09-22 DIAGNOSIS — O209 Hemorrhage in early pregnancy, unspecified: Secondary | ICD-10-CM | POA: Insufficient documentation

## 2022-09-22 DIAGNOSIS — Z6790 Unspecified blood type, Rh positive: Secondary | ICD-10-CM

## 2022-09-22 DIAGNOSIS — Z3490 Encounter for supervision of normal pregnancy, unspecified, unspecified trimester: Secondary | ICD-10-CM

## 2022-09-22 DIAGNOSIS — R109 Unspecified abdominal pain: Secondary | ICD-10-CM | POA: Insufficient documentation

## 2022-09-22 LAB — CBC
HCT: 36.2 % (ref 36.0–46.0)
Hemoglobin: 11.4 g/dL — ABNORMAL LOW (ref 12.0–15.0)
MCH: 25.9 pg — ABNORMAL LOW (ref 26.0–34.0)
MCHC: 31.5 g/dL (ref 30.0–36.0)
MCV: 82.1 fL (ref 80.0–100.0)
Platelets: 301 10*3/uL (ref 150–400)
RBC: 4.41 MIL/uL (ref 3.87–5.11)
RDW: 14.3 % (ref 11.5–15.5)
WBC: 5.5 10*3/uL (ref 4.0–10.5)
nRBC: 0 % (ref 0.0–0.2)

## 2022-09-22 LAB — WET PREP, GENITAL
Clue Cells Wet Prep HPF POC: NONE SEEN
Sperm: NONE SEEN
Trich, Wet Prep: NONE SEEN
WBC, Wet Prep HPF POC: <10 (ref <10)
Yeast Wet Prep HPF POC: NONE SEEN

## 2022-09-22 NOTE — MAU Note (Signed)
Sandra Krueger is a 37 y.o. at 52w4dhere in MAU reporting: was fine when she left, small amt of bleeding.  Now it has gotten heavier and she is passing clots, having contraction like pain, they told her to come back. One ? Clot in the toilet, small on pad, soaked 1/2 a pad. Onset of complaint: 2p.m. Pain score: 8-9 Vitals:   09/22/22 1705  BP: 125/77  Pulse: 87  Resp: 18  Temp: 99.3 F (37.4 C)  SpO2: 98%      Lab orders placed from triage:  none

## 2022-09-22 NOTE — MAU Provider Note (Signed)
History     CSN: [id number]  Arrival date and time: 09/22/22 1023   Event Date/Time   First Provider Initiated Contact with Patient 09/22/22 1147      Chief Complaint  Patient presents with   Vaginal Bleeding   Abdominal Pain   HPI Sandra Krueger is a 37 y.o. G4P3003 at 5w4dwho presents to MAU with chief complaint of vaginal spotting and abdominal pain. These are new problems, onset this morning. Pain score is 7/10. Pain does not radiate. Spotting is light but consistent. Patient denies aggravating or alleviating factors. She denies dysuria and is remote from sexual intercourse.  Patient's pregnancy is complicated by Pregnancy of Unknown Location. She is s/p MAU evaluation on 09/17/2022. She states the earliest she could be scheduled for a viability ultrasound was the end of February.   OB History     Gravida  4   Para  3   Term  3   Preterm      AB      Living  3      SAB      IAB      Ectopic      Multiple  0   Live Births  3           Past Medical History:  Diagnosis Date   Asthma    COVID-19 affecting pregnancy in first trimester 03/24/2021   Late July 2022, (see care everywhere for test)   Fibroids    Gestational diabetes    Headache    Panic attack    last episode approx two months ago per pt    Past Surgical History:  Procedure Laterality Date   CESAREAN SECTION N/A 09/29/2021   Procedure: CESAREAN SECTION;  Surgeon: PAletha Halim MD;  Location: [facility];  Service: Obstetrics;  Laterality: N/A;   OTHER SURGICAL HISTORY     fibroids removed   OVARY SURGERY     cyst removed from ? right ovary    Family History  Problem Relation Age of Onset   Hypertension Mother    Hypertension Father    Diabetes Maternal Aunt    Diabetes Maternal Uncle     Social History   Tobacco Use   Smoking status: Never   Smokeless tobacco: Never  Vaping Use   Vaping Use: Never used  Substance Use Topics   Alcohol use: Not Currently   Drug use:  Not Currently    Types: Marijuana    Comment: as a teenager    Allergies:  Allergies  Allergen Reactions   Bee Venom Anaphylaxis   Other Anaphylaxis    All nuts   Tree Extract Anaphylaxis   Gramineae Pollens Rash    Medications Prior to Admission  Medication Sig Dispense Refill Last Dose   acetaminophen (TYLENOL) 500 MG tablet Take 2 tablets (1,000 mg total) by mouth every 6 (six) hours. (Patient not taking: Reported on 12/01/2021) 30 tablet 0 More than a month   albuterol (PROVENTIL HFA;VENTOLIN HFA) 108 (90 Base) MCG/ACT inhaler Inhale 2 puffs into the lungs every 4 (four) hours as needed for wheezing or shortness of breath. (Patient not taking: Reported on 12/01/2021)   More than a month   albuterol (VENTOLIN HFA) 108 (90 Base) MCG/ACT inhaler Inhale 1-2 puffs into the lungs every 6 (six) hours as needed for wheezing or shortness of breath. (Patient not taking: Reported on 09/15/2022) 1 each 0 More than a month   Prenatal Vit-Fe Fumarate-FA (PRENATAL MULTIVITAMIN) TABS  tablet Take 1 tablet by mouth daily at 12 noon. (Patient not taking: Reported on 09/15/2022) 60 tablet 0     Review of Systems  Gastrointestinal:  Positive for abdominal pain.  Genitourinary:  Positive for vaginal bleeding.  All other systems reviewed and are negative.  Physical Exam   Blood pressure 116/75, pulse 72, temperature 98.4 F (36.9 C), temperature source Oral, resp. rate 18, height 5' 4"$  (1.626 m), weight 117.1 kg, last menstrual period 08/07/2022, SpO2 100 %, not currently breastfeeding.  Physical Exam Vitals and nursing note reviewed. Exam conducted with a chaperone present.  Constitutional:      Appearance: She is well-developed. She is not ill-appearing.  Cardiovascular:     Rate and Rhythm: Normal rate.  Pulmonary:     Effort: Pulmonary effort is normal.  Abdominal:     Palpations: Abdomen is soft.     Tenderness: There is no abdominal tenderness.  Skin:    Capillary Refill: Capillary refill  takes less than 2 seconds.  Neurological:     Mental Status: She is alert and oriented to person, place, and time.  Psychiatric:        Mood and Affect: Mood normal.        Behavior: Behavior normal.     MAU Course  Procedures  MDM  --Vaginal swabs collected 09/13/2022. Re-collection not indicated  Orders Placed This Encounter  Procedures   US OB LESS THAN 14 WEEKS WITH OB TRANSVAGINAL   Discharge patient   Patient Vitals for the past 24 hrs:  BP Temp Temp src Pulse Resp SpO2 Height Weight  09/22/22 1039 116/75 98.4 F (36.9 C) Oral 72 18 100 % 5' 4"$  (1.626 m) 117.1 kg    US OB LESS THAN 14 WEEKS WITH OB TRANSVAGINAL  Result Date: 09/22/2022 CLINICAL DATA:  Spotting and cramping affecting pregnancy. Quantitative beta hCG of 1,434 on September 17, 2022 however no current quantitative beta HCG available at time dictation. EXAM: OBSTETRIC <14 WK [place] AND TRANSVAGINAL OB US TECHNIQUE: Both transabdominal and transvaginal ultrasound examinations were performed for complete evaluation of the gestation as well as the maternal uterus, adnexal regions, and pelvic cul-de-sac. Transvaginal technique was performed to assess early pregnancy. COMPARISON:  Ultrasound September 13, 2022 FINDINGS: Intrauterine gestational sac: Single Yolk sac:  Not Visualized. Embryo:  Visualized. Cardiac Activity: Not Visualized. MSD: 6.1 mm   5 w   2 d CRL:  2.6 mm   5 w   6 d                  [place] [facility]: May 21, 2023 Subchorionic hemorrhage:  None visualized. Maternal uterus/adnexae: Uterine leiomyoma measuring 3 cm. IMPRESSION: Early intrauterine pregnancy of unknown viability. Suggest follow-up with Ob/gyn with monitoring of beta HCG and repeat pelvic ultrasound as clinically indicated Electronically Signed   By: Dahlia Bailiff M.D.   On: 09/22/2022 11:41    Assessment and Plan  --37 y.o. [id number] with SIUP ,measuring 5w 2d --No cardiac activity observed --Discussed with patient FHT typically seen by end of 5th  week --Rh POS --Discharge home in stable condition with first trimester precautions  F/U: --Patient to keep existing appointment for viability ultrasound on 10/10/2022 to confirm cardiac activity  Darlina Rumpf, [place], MSN, CNM 09/22/2022, 3:09 PM

## 2022-09-22 NOTE — Discharge Instructions (Signed)
Prenatal Care Providers           [facility] [place] [facility]  [place] [phone]  [facility] @ [place]  [phone]  [place] @ [facility]       [address] [phone]            [facility] [place] @ [place]     [phone]          [facility] [place] @ [facility]   [phone]  [facility] [place] @ [place] [phone]     [facility] [place] @ [place] ([place])  [place]   [phone]

## 2022-09-22 NOTE — Discharge Instructions (Signed)

## 2022-09-22 NOTE — MAU Note (Signed)
Sandra Krueger is a 37 y.o. at 52w4dhere in MAU reporting: started spotting(red) this morning and cramping (across lower abd) really really bad.  Had already had pain yesterday rt side mid back(none today).  Denies recent intercourse  Onset of complaint: this morning Pain score: 7 Vitals:   09/22/22 1039  BP: 116/75  Pulse: 72  Resp: 18  Temp: 98.4 F (36.9 C)  SpO2: 100%      Lab orders placed from triage:  none

## 2022-09-22 NOTE — MAU Provider Note (Signed)
History     CSN: [id number]  Arrival date and time: 09/22/22 1643  Event Date/Time  First Provider Initiated Contact with Patient 09/22/22 1709     Chief Complaint  Patient presents with   Vaginal Bleeding   Abdominal Pain   HPI Sandra Krueger is a 37 y.o. G4P3003 at 48w4dwho presents to MAU with chief complaint of heavy vaginal bleeding. Patient was evaluated in MAU earlier today. She states her bleeding increased slightly after her transvaginal ultrasound. A couple hours later her bleeding became very heavy and she passed multiple clots.   Patient also reports ongoing lower abdominal pain, now intensifying to pain score 6/10.  She denies aggravating or alleviating factors. She has not taken medication for this complaint.  OB History     Gravida  4   Para  3   Term  3   Preterm      AB      Living  3      SAB      IAB      Ectopic      Multiple  0   Live Births  3           Past Medical History:  Diagnosis Date   Asthma    COVID-19 affecting pregnancy in first trimester 03/24/2021   Late July 2022, (see care everywhere for test)   Fibroids    Gestational diabetes    Headache    Panic attack    last episode approx two months ago per pt    Past Surgical History:  Procedure Laterality Date   CESAREAN SECTION N/A 09/29/2021   Procedure: CESAREAN SECTION;  Surgeon: PAletha Halim MD;  Location: [facility];  Service: Obstetrics;  Laterality: N/A;   OTHER SURGICAL HISTORY     fibroids removed   OVARY SURGERY     cyst removed from ? right ovary    Family History  Problem Relation Age of Onset   Hypertension Mother    Hypertension Father    Diabetes Maternal Aunt    Diabetes Maternal Uncle     Social History   Tobacco Use   Smoking status: Never   Smokeless tobacco: Never  Vaping Use   Vaping Use: Never used  Substance Use Topics   Alcohol use: Not Currently   Drug use: Not Currently    Types: Marijuana    Comment: as a teenager     Allergies:  Allergies  Allergen Reactions   Bee Venom Anaphylaxis   Other Anaphylaxis    All nuts   Tree Extract Anaphylaxis   Gramineae Pollens Rash    Medications Prior to Admission  Medication Sig Dispense Refill Last Dose   acetaminophen (TYLENOL) 500 MG tablet Take 2 tablets (1,000 mg total) by mouth every 6 (six) hours. (Patient not taking: Reported on 12/01/2021) 30 tablet 0    albuterol (PROVENTIL HFA;VENTOLIN HFA) 108 (90 Base) MCG/ACT inhaler Inhale 2 puffs into the lungs every 4 (four) hours as needed for wheezing or shortness of breath. (Patient not taking: Reported on 12/01/2021)      albuterol (VENTOLIN HFA) 108 (90 Base) MCG/ACT inhaler Inhale 1-2 puffs into the lungs every 6 (six) hours as needed for wheezing or shortness of breath. (Patient not taking: Reported on 09/15/2022) 1 each 0    Prenatal Vit-Fe Fumarate-FA (PRENATAL MULTIVITAMIN) TABS tablet Take 1 tablet by mouth daily at 12 noon. (Patient not taking: Reported on 09/15/2022) 60 tablet 0  Review of Systems  Gastrointestinal:  Positive for abdominal pain.  Genitourinary:  Positive for vaginal bleeding.  All other systems reviewed and are negative.  Physical Exam   Blood pressure 125/77, pulse 87, temperature 99.3 F (37.4 C), temperature source Oral, resp. rate 18, last menstrual period 08/07/2022, SpO2 98 %, not currently breastfeeding.  Physical Exam Vitals and nursing note reviewed. Exam conducted with a chaperone present.  Constitutional:      Appearance: She is well-developed.  Cardiovascular:     Rate and Rhythm: Normal rate.  Pulmonary:     Effort: Pulmonary effort is normal.  Skin:    Capillary Refill: Capillary refill takes less than 2 seconds.  Neurological:     Mental Status: She is alert and oriented to person, place, and time.  Psychiatric:        Mood and Affect: Mood normal.        Behavior: Behavior normal.     MAU Course  Procedures  MDM Orders Placed This Encounter   Procedures   Wet prep, genital   US OB Transvaginal   CBC   Discharge patient   Patient Vitals for the past 24 hrs:  BP Temp Temp src Pulse Resp SpO2  09/22/22 1705 125/77 99.3 F (37.4 C) Oral 87 18 98 %   Results for orders placed or performed during the hospital encounter of 09/22/22 (from the past 24 hour(s))  Wet prep, genital     Status: None   Collection Time: 09/22/22  5:16 PM  Result Value Ref Range   Yeast Wet Prep HPF POC NONE SEEN NONE SEEN   Trich, Wet Prep NONE SEEN NONE SEEN   Clue Cells Wet Prep HPF POC NONE SEEN NONE SEEN   WBC, Wet Prep HPF POC <10 <10   Sperm NONE SEEN   CBC     Status: Abnormal   Collection Time: 09/22/22  5:32 PM  Result Value Ref Range   WBC 5.5 4.0 - 10.5 K/uL   RBC 4.41 3.87 - 5.11 MIL/uL   Hemoglobin 11.4 (L) 12.0 - 15.0 g/dL   HCT 36.2 36.0 - 46.0 %   MCV 82.1 80.0 - 100.0 fL   MCH 25.9 (L) 26.0 - 34.0 pg   MCHC 31.5 30.0 - 36.0 g/dL   RDW 14.3 11.5 - 15.5 %   Platelets 301 150 - 400 K/uL   nRBC 0.0 0.0 - 0.2 %   US OB Transvaginal  Result Date: 09/22/2022 CLINICAL DATA:  Vaginal bleeding. EXAM: TRANSVAGINAL OB ULTRASOUND TECHNIQUE: Transvaginal ultrasound was performed for complete evaluation of the gestation as well as the maternal uterus, adnexal regions, and pelvic cul-de-sac. COMPARISON:  Obstetrical ultrasound 09/22/2022 FINDINGS: Intrauterine gestational sac: None Endometrium: Heterogeneous some focal areas of fluid measuring up to 6 mm. Maternal uterus/adnexae: There is a 2.6 x 3.6 x 3.8 cm uterine fibroid. Ovaries are not visualized. There is trace free fluid in the pelvis. IMPRESSION: 1. No intrauterine gestational sac identified. Heterogeneous endometrium with areas of fluid. 2. Uterine fibroid. Electronically Signed   By: Ronney Asters M.D.   On: 09/22/2022 18:28   US OB LESS THAN 14 WEEKS WITH OB TRANSVAGINAL  Result Date: 09/22/2022 CLINICAL DATA:  Spotting and cramping affecting pregnancy. Quantitative beta hCG of 1,434  on September 17, 2022 however no current quantitative beta HCG available at time dictation. EXAM: OBSTETRIC <14 WK [place] AND TRANSVAGINAL OB US TECHNIQUE: Both transabdominal and transvaginal ultrasound examinations were performed for complete evaluation of the gestation as  well as the maternal uterus, adnexal regions, and pelvic cul-de-sac. Transvaginal technique was performed to assess early pregnancy. COMPARISON:  Ultrasound September 13, 2022 FINDINGS: Intrauterine gestational sac: Single Yolk sac:  Not Visualized. Embryo:  Visualized. Cardiac Activity: Not Visualized. MSD: 6.1 mm   5 w   2 d CRL:  2.6 mm   5 w   6 d                  [place] [facility]: May 21, 2023 Subchorionic hemorrhage:  None visualized. Maternal uterus/adnexae: Uterine leiomyoma measuring 3 cm. IMPRESSION: Early intrauterine pregnancy of unknown viability. Suggest follow-up with Ob/gyn with monitoring of beta HCG and repeat pelvic ultrasound as clinically indicated Electronically Signed   By: Dahlia Bailiff M.D.   On: 09/22/2022 11:41    Assessment and Plan  --37 y.o. [id number]  --Miscarriage --Previously seen fetal pole no longer visualized --Rh POS --Hgb 11.4 --Discharge home in stable condition with bleeding precautions  F/U: --Patient reports negative experience at [facility], prefers to follow up  another [facility] location --Patient given list of alternative [facility] offices, advised to be seen in one week  Darlina Rumpf, [place], MSN, CNM 09/22/2022, 7:09 PM

## 2022-09-25 LAB — GC/CHLAMYDIA PROBE AMP (~~LOC~~) NOT AT ARMC
Chlamydia: NEGATIVE
Comment: NEGATIVE
Comment: NORMAL
Neisseria Gonorrhea: NEGATIVE

## 2022-10-10 ENCOUNTER — Other Ambulatory Visit: Payer: Medicaid Other

## 2022-10-26 ENCOUNTER — Emergency Department (HOSPITAL_BASED_OUTPATIENT_CLINIC_OR_DEPARTMENT_OTHER)
Admission: EM | Admit: 2022-10-26 | Discharge: 2022-10-27 | Disposition: A | Discharge status: Home / Self Care | Payer: Medicaid Other | Attending: Emergency Medicine | Admitting: Emergency Medicine

## 2022-10-26 ENCOUNTER — Encounter (HOSPITAL_BASED_OUTPATIENT_CLINIC_OR_DEPARTMENT_OTHER): Payer: Self-pay | Admitting: Emergency Medicine

## 2022-10-26 ENCOUNTER — Other Ambulatory Visit: Payer: Self-pay

## 2022-10-26 ENCOUNTER — Emergency Department (HOSPITAL_BASED_OUTPATIENT_CLINIC_OR_DEPARTMENT_OTHER): Payer: Medicaid Other

## 2022-10-26 DIAGNOSIS — D252 Subserosal leiomyoma of uterus: Secondary | ICD-10-CM | POA: Insufficient documentation

## 2022-10-26 DIAGNOSIS — R109 Unspecified abdominal pain: Secondary | ICD-10-CM | POA: Insufficient documentation

## 2022-10-26 DIAGNOSIS — N939 Abnormal uterine and vaginal bleeding, unspecified: Secondary | ICD-10-CM | POA: Diagnosis present

## 2022-10-26 DIAGNOSIS — J45909 Unspecified asthma, uncomplicated: Secondary | ICD-10-CM | POA: Diagnosis not present

## 2022-10-26 DIAGNOSIS — N920 Excessive and frequent menstruation with regular cycle: Secondary | ICD-10-CM | POA: Diagnosis not present

## 2022-10-26 LAB — COMPREHENSIVE METABOLIC PANEL
ALT: 9 U/L (ref 0–44)
AST: 14 U/L — ABNORMAL LOW (ref 15–41)
Albumin: 4.1 g/dL (ref 3.5–5.0)
Alkaline Phosphatase: 47 U/L (ref 38–126)
Anion gap: 10 (ref 5–15)
BUN: 8 mg/dL (ref 6–20)
CO2: 23 mmol/L (ref 22–32)
Calcium: 9.1 mg/dL (ref 8.9–10.3)
Chloride: 108 mmol/L (ref 98–111)
Creatinine, Ser: 0.64 mg/dL (ref 0.44–1.00)
GFR, Estimated: >60 mL/min (ref >60)
Glucose, Bld: 97 mg/dL (ref 70–99)
Potassium: 3.6 mmol/L (ref 3.5–5.1)
Sodium: 141 mmol/L (ref 135–145)
Total Bilirubin: 0.3 mg/dL (ref 0.3–1.2)
Total Protein: 8 g/dL (ref 6.5–8.1)

## 2022-10-26 LAB — CBC
HCT: 36.2 % (ref 36.0–46.0)
Hemoglobin: 11.1 g/dL — ABNORMAL LOW (ref 12.0–15.0)
MCH: 25.2 pg — ABNORMAL LOW (ref 26.0–34.0)
MCHC: 30.7 g/dL (ref 30.0–36.0)
MCV: 82.1 fL (ref 80.0–100.0)
Platelets: 292 10*3/uL (ref 150–400)
RBC: 4.41 MIL/uL (ref 3.87–5.11)
RDW: 14.4 % (ref 11.5–15.5)
WBC: 5.6 10*3/uL (ref 4.0–10.5)
nRBC: 0 % (ref 0.0–0.2)

## 2022-10-26 LAB — HCG, QUANTITATIVE, PREGNANCY: hCG, Beta Chain, Quant, S: 1 m[IU]/mL (ref <5)

## 2022-10-26 NOTE — ED Triage Notes (Addendum)
Arrive POV to ED. Ambulatory. Aox4. NAD.  Miscarried 2/9  This is first menstruation since and it seems heavier than expected. Estimates 6-7 pads a day. Started 3/11. Has been feeling light-headed for several weeks- worsened with this bleeding. HX fibroids,asthma, gestational diabetes. No meds.

## 2022-10-27 MED ORDER — MEGESTROL ACETATE 40 MG PO TABS: 40.0000 mg | ORAL_TABLET | Freq: Every day | ORAL | 0 refills | Status: DC

## 2022-10-27 NOTE — ED Provider Notes (Signed)
[place] EMERGENCY DEPARTMENT AT [facility] Provider Note   CSN: [id number] Arrival date & time: 10/26/22  1911     History Chief Complaint  Patient presents with   Vaginal Bleeding    Sandra Krueger is a 37 y.o. female with history of uterine fibroids and asthma presents emergency room today for evaluation of heavy menstrual bleeding.  Patient reports that she miscarried on 09-22-2022.  She reports that she was around [redacted] weeks pregnant.  This is her first miscarriage.  She reports that she started her cycle on Monday/Tuesday.  She reports that she has been having vaginal bleeding going through 1 pad an hour for the past several days.  Reports clots are swallowed a quarter.  She reports that she just felt a little lightheaded and fatigued but no syncope.  No room spinning sensation.  No chest pain or shortness of breath.  Denies any abdominal pain, nausea, vomiting.  Denies any bowel changes or urinary symptoms.  She reports that she has been drinking more alcohol over the past month lately. She reports that she has been under a great amount of stress with the miscarriage and her boyfriend breaking up with her. She denies any SI or HI.   Vaginal Bleeding Associated symptoms: no abdominal pain, no dysuria, no fever, no nausea and no vaginal discharge        Home Medications Prior to Admission medications   Medication Sig Start Date End Date Taking? Authorizing Provider  megestrol (MEGACE) 40 MG tablet Take 1 tablet (40 mg total) by mouth daily. 10/27/22  Yes Achille Rich, PA-C  acetaminophen (TYLENOL) 500 MG tablet Take 2 tablets (1,000 mg total) by mouth every 6 (six) hours. Patient not taking: Reported on 12/01/2021 10/02/21   Warner Mccreedy, MD  albuterol (PROVENTIL HFA;VENTOLIN HFA) 108 (90 Base) MCG/ACT inhaler Inhale 2 puffs into the lungs every 4 (four) hours as needed for wheezing or shortness of breath. Patient not taking: Reported on 12/01/2021    [provider]   albuterol (VENTOLIN HFA) 108 (90 Base) MCG/ACT inhaler Inhale 1-2 puffs into the lungs every 6 (six) hours as needed for wheezing or shortness of breath. Patient not taking: Reported on 09/15/2022 04/12/22   Merrilee Jansky, MD      Allergies    Bee venom, Other, Tree extract, and Gramineae pollens    Review of Systems   Review of Systems  Constitutional:  Negative for chills and fever.  Respiratory:  Negative for shortness of breath.   Cardiovascular:  Negative for chest pain.  Gastrointestinal:  Negative for abdominal pain, constipation, diarrhea, nausea and vomiting.  Genitourinary:  Positive for vaginal bleeding. Negative for dysuria, hematuria and vaginal discharge.    Physical Exam Updated Vital Signs BP 123/85 (BP Location: Right Arm)   Pulse 66   Temp 98.1 F (36.7 C) (Oral)   Resp 16   Ht 5\' 4"  (1.626 m)   Wt 115.2 kg   LMP 10/23/2022 (Approximate)   SpO2 100%   Breastfeeding Unknown   BMI 43.60 kg/m  Physical Exam Vitals and nursing note reviewed.  Constitutional:      General: She is not in acute distress.    Appearance: She is not ill-appearing or toxic-appearing.     Comments: On phone in no acute distress  HENT:     Mouth/Throat:     Mouth: Mucous membranes are moist.  Eyes:     General: No scleral icterus. Cardiovascular:     Rate and  Rhythm: Normal rate.  Pulmonary:     Effort: Pulmonary effort is normal. No respiratory distress.  Abdominal:     Palpations: Abdomen is soft.     Tenderness: There is no abdominal tenderness. There is no guarding or rebound.     Comments: Limited to body habitus.  No tenderness palpation upon deep or light palpation.  Soft.  No guarding or rebound.  Normal active bowel sounds.  Musculoskeletal:     Cervical back: Normal range of motion.  Skin:    General: Skin is warm and dry.  Neurological:     Mental Status: She is alert.     GCS: GCS eye subscore is 4. GCS verbal subscore is 5. GCS motor subscore is 6.      Cranial Nerves: No cranial nerve deficit, dysarthria or facial asymmetry.     Motor: No weakness.     Gait: Gait is intact.  Psychiatric:        Thought Content: Thought content does not include suicidal ideation. Thought content does not include suicidal plan.     ED Results / Procedures / Treatments   Labs (all labs ordered are listed, but only abnormal results are displayed) Labs Reviewed  CBC - Abnormal; Notable for the following components:      Result Value   Hemoglobin 11.1 (*)    MCH 25.2 (*)    All other components within normal limits  COMPREHENSIVE METABOLIC PANEL - Abnormal; Notable for the following components:   AST 14 (*)    All other components within normal limits  HCG, QUANTITATIVE, PREGNANCY    EKG None  Radiology US Pelvis Complete  Result Date: 10/26/2022 CLINICAL DATA:  Heavy bleeding after miscarriage EXAM: TRANSABDOMINAL ULTRASOUND OF PELVIS DOPPLER ULTRASOUND OF OVARIES TECHNIQUE: Transabdominal ultrasound examination of the pelvis was performed including evaluation of the uterus, ovaries, adnexal regions, and pelvic cul-de-sac. Color and duplex Doppler ultrasound was utilized to evaluate blood flow to the ovaries. COMPARISON:  Ob ultrasound 09/22/2022 FINDINGS: Uterus Measurements: 9.7 x 3.7 x 5.8 cm = volume: 108.9 mL. Posterior subserosal fibroid measuring 4 x 2.9 x 3.9 cm Endometrium Thickness: 5.5 mm.  No focal abnormality visualized. Right ovary Measurements: 3.5 x 1.9 x 2.7 cm = volume: 9.4 mL. Normal appearance/no adnexal mass. Left ovary Measurements: 3.5 x 1.9 x 1.9 cm = volume: 6.7 mL. Normal appearance/no adnexal mass. Pulsed Doppler evaluation demonstrates normal low-resistance arterial and venous waveforms in both ovaries. Other: Trace free fluid IMPRESSION: 1. 4 cm posterior subserosal uterine fibroid. Otherwise negative pelvic ultrasound. Normal endometrial thickness 2. Trace free fluid in the pelvis. Electronically Signed   By: Jasmine Pang M.D.    On: 10/26/2022 23:39   [place] Art/Ven Flow Abd Pelv Doppler  Result Date: 10/26/2022 CLINICAL DATA:  Heavy bleeding after miscarriage EXAM: TRANSABDOMINAL ULTRASOUND OF PELVIS DOPPLER ULTRASOUND OF OVARIES TECHNIQUE: Transabdominal ultrasound examination of the pelvis was performed including evaluation of the uterus, ovaries, adnexal regions, and pelvic cul-de-sac. Color and duplex Doppler ultrasound was utilized to evaluate blood flow to the ovaries. COMPARISON:  Ob ultrasound 09/22/2022 FINDINGS: Uterus Measurements: 9.7 x 3.7 x 5.8 cm = volume: 108.9 mL. Posterior subserosal fibroid measuring 4 x 2.9 x 3.9 cm Endometrium Thickness: 5.5 mm.  No focal abnormality visualized. Right ovary Measurements: 3.5 x 1.9 x 2.7 cm = volume: 9.4 mL. Normal appearance/no adnexal mass. Left ovary Measurements: 3.5 x 1.9 x 1.9 cm = volume: 6.7 mL. Normal appearance/no adnexal mass. Pulsed Doppler evaluation demonstrates normal  low-resistance arterial and venous waveforms in both ovaries. Other: Trace free fluid IMPRESSION: 1. 4 cm posterior subserosal uterine fibroid. Otherwise negative pelvic ultrasound. Normal endometrial thickness 2. Trace free fluid in the pelvis. Electronically Signed   By: Jasmine Pang M.D.   On: 10/26/2022 23:39    Procedures Procedures   Medications Ordered in ED Medications - No data to display  ED Course/ Medical Decision Making/ A&P                           Medical Decision Making Amount and/or Complexity of Data Reviewed Labs: ordered. Radiology: ordered.  Risk Prescription drug management.   37 y.o. female presents to the ER for evaluation of heavy menstrual bleeding. Differential diagnosis includes but is not limited to Abnormal uterine bleeding, threatened miscarriage, incomplete miscarriage, normal bleeding from an early trimester pregnancy, ectopic pregnancy, vaginal/cervical trauma, subchorionic hemorrhage/hematoma. Vital signs unremarkable. Physical exam as noted above.    Labs and US imaging ordered.   I independently reviewed and interpreted the patient's labs.  CMP shows mildly decreased AST otherwise no electrolyte or LFT abnormality.  hCG is negative.  CBC shows anemia with a hemoglobin 11.1 although her baseline is around 11.4.  US shows 1. 4 cm posterior subserosal uterine fibroid. Otherwise negative pelvic ultrasound. Normal endometrial thickness 2. Trace free fluid in the pelvis.  Patient reports that she had heavy menstrual bleeding before her miscarriage because of fibroids.  He is even had to have surgery before for fibroids.  Given the 4 cm fibroid seen, could be causing her increase in vaginal bleeding as well.  Her anemia is at its baseline.  I think she may be fatigued and lightheaded from bleeding as well as her stress and increase in alcohol intake.  I do not think she is having a stroke or any ACS or TIA.  I spoke with Dr. Jolayne Panther about the patient and her heavy vaginal bleeding.  She recommends 40 mg of Megace daily for the next 30 days until she can be seen by gynecology.  I discussed Dr. Deretha Emory recommendations with the patient.  Discussed her lab and imaging results.  Bleeding could be from miscarriage and also from fibroids.  Will hold the Megace will help her.  She does not have a history of any blood clots or migraines with aura. We discussed strict return precautions and red flag symptoms. The patient verbalized their understanding and agrees to the plan. The patient is stable and being discharged home in good condition.  Portions of this report may have been transcribed using voice recognition software. Every effort was made to ensure accuracy; however, inadvertent computerized transcription errors may be present.   Final Clinical Impression(s) / ED Diagnoses Final diagnoses:  Menorrhagia with regular cycle  Subserous leiomyoma of uterus    Rx / DC Orders ED Discharge Orders          Ordered    megestrol (MEGACE) 40 MG tablet   Daily        10/27/22 0106              Achille Rich, PA-C 10/30/22 1245    Rondel Baton, MD 11/01/22 [phone]

## 2022-10-27 NOTE — Discharge Instructions (Addendum)
You were seen in the ER today for evaluation of your increased vaginal bleeding.  Your ultrasound showed that you have a fibroid.  Your increase in bleeding may be due to this or due to your recent miscarriage.  I spoken with our on-call gynecology team who recommend you take 40 mg of Megace once daily.  Recommended giving you 30 days of this for enough time for you to be seen by your OB/GYN.  Please make sure you call them to schedule an appointment as soon as possible. If you have any concerns, new or worsening symptoms, please return to the nearest emergency department for evaluation.  Contact a doctor if: You soak through a pad or tampon every 1 or 2 hours, and this happens every time you have a period. You need to use pads and tampons at the same time because you are bleeding so much. You are taking medicine, and: You feel like you may vomit. You vomit. You have watery poop (diarrhea). You have other problems that may be related to the medicine you are taking. Get help right away if: You soak through more than a pad or tampon in 1 hour. You pass clots bigger than 1 inch (2.5 cm) wide. You feel short of breath. You feel like your heart is beating too fast. You feel dizzy or you faint. You feel very weak or tired.

## 2023-03-14 ENCOUNTER — Other Ambulatory Visit: Payer: Self-pay

## 2023-03-14 ENCOUNTER — Emergency Department (HOSPITAL_COMMUNITY)
Admission: EM | Admit: 2023-03-14 | Discharge: 2023-03-15 | Disposition: A | Discharge status: Home / Self Care | Payer: Medicaid Other | Attending: Emergency Medicine | Admitting: Emergency Medicine

## 2023-03-14 ENCOUNTER — Emergency Department (HOSPITAL_COMMUNITY): Payer: Medicaid Other

## 2023-03-14 ENCOUNTER — Encounter (HOSPITAL_COMMUNITY): Payer: Self-pay | Admitting: Emergency Medicine

## 2023-03-14 DIAGNOSIS — W010XXA Fall on same level from slipping, tripping and stumbling without subsequent striking against object, initial encounter: Secondary | ICD-10-CM | POA: Insufficient documentation

## 2023-03-14 DIAGNOSIS — S8001XA Contusion of right knee, initial encounter: Secondary | ICD-10-CM | POA: Insufficient documentation

## 2023-03-14 DIAGNOSIS — M25561 Pain in right knee: Secondary | ICD-10-CM | POA: Diagnosis present

## 2023-03-14 DIAGNOSIS — W19XXXA Unspecified fall, initial encounter: Secondary | ICD-10-CM

## 2023-03-14 DIAGNOSIS — M546 Pain in thoracic spine: Secondary | ICD-10-CM | POA: Diagnosis not present

## 2023-03-14 NOTE — ED Provider Notes (Signed)
  [place] EMERGENCY DEPARTMENT AT [facility] Provider Note   CSN: [id number] Arrival date & time: 03/14/23  2155     History {Add pertinent medical, surgical, social history, OB history to HPI:1} Chief Complaint  Patient presents with   Fall   Knee Pain    Sandra Krueger is a 37 y.o. female.  HPI     Home Medications Prior to Admission medications   Medication Sig Start Date End Date Taking? Authorizing Provider  acetaminophen (TYLENOL) 500 MG tablet Take 2 tablets (1,000 mg total) by mouth every 6 (six) hours. Patient not taking: Reported on 12/01/2021 10/02/21   Warner Mccreedy, MD  albuterol (PROVENTIL HFA;VENTOLIN HFA) 108 (90 Base) MCG/ACT inhaler Inhale 2 puffs into the lungs every 4 (four) hours as needed for wheezing or shortness of breath. Patient not taking: Reported on 12/01/2021    [provider]  albuterol (VENTOLIN HFA) 108 (90 Base) MCG/ACT inhaler Inhale 1-2 puffs into the lungs every 6 (six) hours as needed for wheezing or shortness of breath. Patient not taking: Reported on 09/15/2022 04/12/22   Merrilee Jansky, MD  megestrol (MEGACE) 40 MG tablet Take 1 tablet (40 mg total) by mouth daily. 10/27/22   Achille Rich, PA-C      Allergies    Bee venom, Other, Tree extract, and Gramineae pollens    Review of Systems   Review of Systems  Physical Exam Updated Vital Signs BP (!) 133/103   Pulse 79   Temp 98.1 F (36.7 C) (Oral)   Resp 18   Ht 5\' 4"  (1.626 m)   Wt 115 kg   LMP 08/07/2022 (Within Weeks)   SpO2 95%   BMI 43.52 kg/m  Physical Exam  ED Results / Procedures / Treatments   Labs (all labs ordered are listed, but only abnormal results are displayed) Labs Reviewed - No data to display  EKG None  Radiology No results found.  Procedures Procedures  {Document cardiac monitor, telemetry assessment procedure when appropriate:1}  Medications Ordered in ED Medications - No data to display  ED Course/ Medical Decision  Making/ A&P   {   Click here for ABCD2, HEART and other calculatorsREFRESH Note before signing :1}                              Medical Decision Making Amount and/or Complexity of Data Reviewed Radiology: ordered.   ***  {Document critical care time when appropriate:1} {Document review of labs and clinical decision tools ie heart score, Chads2Vasc2 etc:1}  {Document your independent review of radiology images, and any outside records:1} {Document your discussion with family members, caretakers, and with consultants:1} {Document social determinants of health affecting pt's care:1} {Document your decision making why or why not admission, treatments were needed:1} Final Clinical Impression(s) / ED Diagnoses Final diagnoses:  None    Rx / DC Orders ED Discharge Orders     None

## 2023-03-14 NOTE — ED Triage Notes (Signed)
Patient had a mechanical fall, slipped and fell on detergent at [employer] tonight. Patient land on R knee complaining of R knee pain. Denies hitting head, LOC. Was ambulatory afterwards.

## 2023-03-15 MED ORDER — ACETAMINOPHEN 500 MG PO TABS
1000.0000 mg | ORAL_TABLET | Freq: Once | ORAL | Status: AC
  Administered 2023-03-15: 1000 mg via ORAL
  Filled 2023-03-15: qty 2

## 2023-03-15 MED ORDER — LIDOCAINE 5 % EX PTCH
1.0000 | MEDICATED_PATCH | CUTANEOUS | Status: DC
  Administered 2023-03-15: 1 via TRANSDERMAL
  Filled 2023-03-15: qty 1

## 2023-03-15 NOTE — ED Notes (Signed)
Pt provided with AVS.  Education complete; all questions answered.  Pt leaving ED in stable condition at this time via wheelchair with all belongings. 

## 2023-03-15 NOTE — Progress Notes (Signed)
Orthopedic Tech Progress Note Patient Details:  Sandra Krueger May 02, 1986 [id number]  Applied Ace and crutches. Pt knew how to use crutches. Ortho Devices Type of Ortho Device: Ace wrap, Crutches Ortho Device/Splint Interventions: Application, Ordered, Adjustment   Post Interventions Patient Tolerated: Well Instructions Provided: Care of device, Adjustment of device  Sherilyn Banker 03/15/2023, 1:16 AM

## 2023-03-15 NOTE — Discharge Instructions (Signed)
You are seen in the ER today after fall.  Your x-ray was  normal.  You have likely strained to the knee.  You may use the provided crutches for support but please continue to walk on the leg.  Use Tylenol and ibuprofen as needed.  He may continue use the Ace wrap as needed.   The muscles in your back are in what is called spasm, meaning they are inappropriately tightened up.  This can be quite painful.  To help with your pain you may take Tylenol and / or NSAID medication (such as ibuprofen or naproxen) to help with your pain.    You may also utilize topical pain relief such as Biofreeze, IcyHot, or topical lidocaine patches.  I also recommend that you apply heat to the area, such as a hot shower or heating pad, and follow heat application with massage of the muscles that are most tight.  Please return to the emergency department if you develop any numbness/tingling/weakness in your arms or legs, any difficulty urinating, or urinary incontinence chest pain, shortness of breath, abdominal pain, nausea or vomiting that does not stop, or any other new severe symptoms.

## 2023-05-02 ENCOUNTER — Ambulatory Visit (INDEPENDENT_AMBULATORY_CARE_PROVIDER_SITE_OTHER): Payer: Medicaid Other | Admitting: *Deleted

## 2023-05-02 DIAGNOSIS — Z3201 Encounter for pregnancy test, result positive: Secondary | ICD-10-CM

## 2023-05-02 DIAGNOSIS — Z3687 Encounter for antenatal screening for uncertain dates: Secondary | ICD-10-CM

## 2023-05-02 DIAGNOSIS — Z3200 Encounter for pregnancy test, result unknown: Secondary | ICD-10-CM

## 2023-05-02 LAB — POCT PREGNANCY, URINE: Preg Test, Ur: POSITIVE — AB

## 2023-05-02 NOTE — Progress Notes (Signed)
Sandra Krueger dropped off urine for a pregnancy test which was positive. I called [name] and informed  her upt positive. She reports LMP around 03/25/23 but states it was lighter than usual. I explained we recommend a dating [place] and she agreed to an appointment next week. She plans to go here for prenatal care. She has started PNV. We reviewed  meds. I also reviewed her prenatal care options. I advised she should schedule new ob visit and intake once she has EDD determined with [place]. She voices understanding. Nancy Fetter

## 2023-05-09 ENCOUNTER — Telehealth: Payer: Self-pay

## 2023-05-09 NOTE — Telephone Encounter (Signed)
Pt called stating that she had the shakes in the middle of night for last couple of days.  She reports that she is not a diabetic however she was GDM in past pregnancy.  Could someone give her a call.   Leonette Nutting  05/09/23

## 2023-05-09 NOTE — Telephone Encounter (Signed)
Attempted to call patient at number listed in chart--sent to unidentified VM--left message for patient to call office back with any concerns.   Kassidy Frankson RN on 05/09/23 at 1500

## 2023-05-10 ENCOUNTER — Ambulatory Visit: Payer: Medicaid Other

## 2023-05-10 ENCOUNTER — Other Ambulatory Visit: Payer: Self-pay

## 2023-05-10 DIAGNOSIS — Z3A01 Less than 8 weeks gestation of pregnancy: Secondary | ICD-10-CM | POA: Diagnosis not present

## 2023-05-10 DIAGNOSIS — Z3687 Encounter for antenatal screening for uncertain dates: Secondary | ICD-10-CM | POA: Diagnosis not present

## 2023-05-14 ENCOUNTER — Telehealth: Payer: Self-pay | Admitting: *Deleted

## 2023-05-14 DIAGNOSIS — O219 Vomiting of pregnancy, unspecified: Secondary | ICD-10-CM

## 2023-05-14 NOTE — Telephone Encounter (Addendum)
-----   Message from Bethany sent at 05/10/2023  5:01 PM EDT ----- Review that ultrasound confirms intrauterine pregnancy with recommendation to repeat ultrasound in 10-14 days to confirm viability.   9/30  1515  Called pt and she did not answer.  Message left stating that I am calling with test results and recommendation from the doctor. Please call the office to receive the information.

## 2023-05-15 MED ORDER — DOXYLAMINE-PYRIDOXINE 10-10 MG PO TBEC: 2.0000 | DELAYED_RELEASE_TABLET | Freq: Every day | ORAL | 2 refills | Status: DC

## 2023-05-15 NOTE — Telephone Encounter (Addendum)
Called pt for second attempt to review results. Reviewed [place] results and provider recommendation. [place] scheduled for 05/21/23. Pt reported in previous encounter "shakes in middle of night." We have been unable to reach patient to discuss. Pt reports some nausea and very limited vomiting. Has been having trouble drinking anything other than water. Offered meds, pt desires. Diclegis sent to pharmacy. Encouraged pt to try a small snack before bed with protein to see if shakiness is related to blood sugar dropping overnight.

## 2023-05-24 ENCOUNTER — Ambulatory Visit: Payer: Medicaid Other

## 2023-05-24 DIAGNOSIS — Z3481 Encounter for supervision of other normal pregnancy, first trimester: Secondary | ICD-10-CM

## 2023-05-24 DIAGNOSIS — O3680X0 Pregnancy with inconclusive fetal viability, not applicable or unspecified: Secondary | ICD-10-CM

## 2023-05-24 DIAGNOSIS — Z3A08 8 weeks gestation of pregnancy: Secondary | ICD-10-CM | POA: Diagnosis not present

## 2023-06-05 ENCOUNTER — Encounter: Payer: Self-pay | Admitting: Family Medicine

## 2023-06-07 ENCOUNTER — Telehealth: Payer: Medicaid Other | Admitting: *Deleted

## 2023-06-07 DIAGNOSIS — J45909 Unspecified asthma, uncomplicated: Secondary | ICD-10-CM | POA: Insufficient documentation

## 2023-06-07 DIAGNOSIS — D219 Benign neoplasm of connective and other soft tissue, unspecified: Secondary | ICD-10-CM | POA: Insufficient documentation

## 2023-06-07 DIAGNOSIS — O09899 Supervision of other high risk pregnancies, unspecified trimester: Secondary | ICD-10-CM | POA: Insufficient documentation

## 2023-06-07 DIAGNOSIS — O09299 Supervision of pregnancy with other poor reproductive or obstetric history, unspecified trimester: Secondary | ICD-10-CM | POA: Insufficient documentation

## 2023-06-07 DIAGNOSIS — O09529 Supervision of elderly multigravida, unspecified trimester: Secondary | ICD-10-CM | POA: Insufficient documentation

## 2023-06-07 DIAGNOSIS — Z3689 Encounter for other specified antenatal screening: Secondary | ICD-10-CM | POA: Diagnosis not present

## 2023-06-07 DIAGNOSIS — O99210 Obesity complicating pregnancy, unspecified trimester: Secondary | ICD-10-CM

## 2023-06-07 DIAGNOSIS — J4520 Mild intermittent asthma, uncomplicated: Secondary | ICD-10-CM

## 2023-06-07 DIAGNOSIS — O0990 Supervision of high risk pregnancy, unspecified, unspecified trimester: Secondary | ICD-10-CM | POA: Insufficient documentation

## 2023-06-07 MED ORDER — BLOOD PRESSURE KIT DEVI: 1.0000 | 0 refills | Status: AC | PRN

## 2023-06-07 MED ORDER — GOJJI WEIGHT SCALE MISC: 1.0000 | 0 refills | Status: AC

## 2023-06-07 NOTE — Progress Notes (Signed)
New OB Intake  I connected with Eula Fried  on 06/07/23 at  9:15 AM EDT by MyChart Video Visit and verified that I am speaking with the correct person using two identifiers. Nurse is located at [facility] and pt is located at home.  I discussed the limitations, risks, security and privacy concerns of performing an evaluation and management service by telephone and the availability of in person appointments. I also discussed with the patient that there may be a patient responsible charge related to this service. The patient expressed understanding and agreed to proceed.  I explained I am completing New OB Intake today. We discussed EDD of 01/04/2024, by Ultrasound. Pt is [id number]. I reviewed her allergies, medications and Medical/Surgical/OB history.  We discussed her recent MyChart message. She states she is able to keep water down, but no other liquids. She is eating small bits. During intake she was eating a cucumber. She reports by shaky she means she thinks she is GDM again. I explained we will check a hgb A1c with her new ob labs next week.  She has not picked up diclegis yet. We discussed how to take it correctly and to go to [facility] for symptoms of dehydration.   Patient Active Problem List   Diagnosis Date Noted   Supervision of high risk pregnancy, antepartum 06/07/2023   AMA (advanced maternal age) multigravida 35+ 06/07/2023   History of gestational diabetes mellitus (GDM) in prior pregnancy, currently pregnant 06/07/2023   Short interval between pregnancies affecting pregnancy, antepartum 06/07/2023   Asthma    Fibroids    History of primary cesarean section 09/29/2021   Alpha thalassemia silent carrier 04/18/2021   Low TSH level 03/30/2021   History of COVID-19 03/17/2021   BMI 40.0-44.9, adult (HCC) 11/16/2017    Concerns addressed today  Delivery Plans Plans to deliver at [facility]. Discussed the nature of our practice with multiple providers including residents and students. Due  to the size of the practice, the delivering provider may not be the same as those providing prenatal care.   Patient is not interested in water birth.   MyChart/Babyscripts MyChart access verified. I explained pt will have some visits in office and some virtually. Babyscripts instructions given and order placed.  Blood Pressure Cuff/Weight Scale Blood pressure cuff ordered for patient to pick-up from [employer]. Explained after first prenatal appt pt will check weekly and document in Babyscripts. Patient does not have weight scale; order sent to [facility], patient may track weight weekly in Babyscripts.  Anatomy US Explained first scheduled [place] will be around 19 weeks. Anatomy US scheduled for 12/27 at 0915.  Is patient a CenteringPregnancy candidate?  Declined Declined due to Childcare   Is patient a Mom+Baby Combined Care candidate?  Not a candidate    Interested in [place]?  Yes. Referral sent  and dot phrase sent to patient.  Is patient a candidate for Babyscripts Optimization? No - AMA  First visit review I reviewed new OB appt with patient. Explained pt will be seen by Dr. Shawnie Pons at first visit. Discussed Avelina Laine genetic screening with patient. She would like Panorama drawn with routine prenatal labs at new ob visit.    Last Pap No results found for: "DIAGPAP"  Nancy Fetter 06/07/2023  9:58 AM

## 2023-06-07 NOTE — Patient Instructions (Signed)
Options for Doula Care in the [place]  As you review your birthing options, consider having a birth doula. A doula is trained to provide support before, during and just after you give birth. There are also postpartum doulas that help you adjust to new parenthood.  While doulas do not provide medical care, they do provide emotional, physical and educational support. A few months before your baby arrives, doulas can help answer questions, ease concerns and help you create and support your birthing plan.    Doulas can help reduce your stress and comfort you and your partner. They can help you cope with labor by helping you use breathing techniques, massage, creative labor positioning, essential oils and affirmations.   Studies show that the benefits of having a doula include:   A more positive birth experience  Fewer requests for pain-relief medication  Less likelihood of cesarean section, commonly called a c-section   Doulas are typically hired via a fee and contract between you and the doula. We are happy to provide a list of the most active doulas in the area, all of whom are credentialed by [facility] and will not count as a visitor at your birth.  There are several options for no-cost doula care at our hospital, including:  [facility] Volunteer Doula Program Every Baby [place] Doula Program A Cure 4 Moms Doula Study (available only at [facility], [facility], [place] and [place] [facility] offices)  For more information on these programs or to receive a list of doulas active in our area, please email [email or web address]         

## 2023-06-14 ENCOUNTER — Other Ambulatory Visit (HOSPITAL_COMMUNITY)
Admission: RE | Admit: 2023-06-14 | Discharge: 2023-06-14 | Disposition: A | Discharge status: Home / Self Care | Payer: Medicaid Other | Source: Ambulatory Visit | Attending: Family Medicine | Admitting: Family Medicine

## 2023-06-14 ENCOUNTER — Other Ambulatory Visit: Payer: Self-pay

## 2023-06-14 ENCOUNTER — Encounter: Payer: Self-pay | Admitting: Family Medicine

## 2023-06-14 ENCOUNTER — Ambulatory Visit (INDEPENDENT_AMBULATORY_CARE_PROVIDER_SITE_OTHER): Payer: Medicaid Other | Admitting: Family Medicine

## 2023-06-14 VITALS — BP 113/79 | HR 81 | Wt 266.2 lb

## 2023-06-14 DIAGNOSIS — Z98891 History of uterine scar from previous surgery: Secondary | ICD-10-CM

## 2023-06-14 DIAGNOSIS — O09891 Supervision of other high risk pregnancies, first trimester: Secondary | ICD-10-CM | POA: Diagnosis not present

## 2023-06-14 DIAGNOSIS — D563 Thalassemia minor: Secondary | ICD-10-CM

## 2023-06-14 DIAGNOSIS — O0990 Supervision of high risk pregnancy, unspecified, unspecified trimester: Secondary | ICD-10-CM | POA: Insufficient documentation

## 2023-06-14 DIAGNOSIS — O09899 Supervision of other high risk pregnancies, unspecified trimester: Secondary | ICD-10-CM

## 2023-06-14 DIAGNOSIS — O0991 Supervision of high risk pregnancy, unspecified, first trimester: Secondary | ICD-10-CM

## 2023-06-14 DIAGNOSIS — O09521 Supervision of elderly multigravida, first trimester: Secondary | ICD-10-CM

## 2023-06-14 DIAGNOSIS — Z9889 Other specified postprocedural states: Secondary | ICD-10-CM

## 2023-06-14 DIAGNOSIS — Z3A10 10 weeks gestation of pregnancy: Secondary | ICD-10-CM

## 2023-06-14 MED ORDER — ASPIRIN 81 MG PO TBEC: 81.0000 mg | DELAYED_RELEASE_TABLET | Freq: Every day | ORAL | 3 refills | Status: DC

## 2023-06-14 NOTE — Progress Notes (Signed)
Subjective:   Sandra Krueger is a 37 y.o. [phone] at [redacted]w[redacted]d by early ultrasound being seen today for her first obstetrical visit.  Her obstetrical history is significant for advanced maternal age, obesity, and previous C-section . Patient does intend to breast feed. Pregnancy history fully reviewed.  Patient reports no complaints.  HISTORY: OB History  Gravida Para Term Preterm AB Living  5 3 3  0 1 3  SAB IAB Ectopic Multiple Live Births  1 0 0 0 3    # Outcome Date GA Lbr Len/2nd Weight Sex Type Anes PTL Lv  5 Current           4 SAB 09/2022 [redacted]w[redacted]d         3 Term 09/29/21 [redacted]w[redacted]d  8 lb 1.1 oz (3.66 kg) F CS-LTranv Spinal  LIV     Birth Comments: GDM     Name: Sheperd,GIRL Sarie     Apgar1: 8  Apgar5: 9  2 Term 06/29/18 [redacted]w[redacted]d / 00:03 7 lb 11 oz (3.487 kg) F Vag-Spont None  LIV     Birth Comments: had BMZ at 36 wk due to subchorionic hemorrhage vs placental lake     Name: ALEJANDRA, DIEBERT     Apgar1: 8  Apgar5: 9  1 Term 02/10/09 [redacted]w[redacted]d  5 lb 6 oz (2.438 kg) F Vag-Spont None  LIV     Birth Comments: wnl- fast labor - 9cm when admitted to hospital   Past Medical History:  Diagnosis Date   Asthma    Complication of anesthesia    COVID-19 affecting pregnancy in first trimester 03/24/2021   Late July 2022, (see care everywhere for test)   Fibroids    Gestational diabetes    Headache    Panic attack    last episode approx two months ago per pt   Past Surgical History:  Procedure Laterality Date   CESAREAN SECTION N/A 09/29/2021   Procedure: CESAREAN SECTION;  Surgeon: [place] Bing, MD;  [facility]: [facility];  Service: Obstetrics;  Laterality: N/A;   OTHER SURGICAL HISTORY     fibroids removed   OVARY SURGERY     cyst removed from ? right ovary   Family History  Problem Relation Age of Onset   Hypertension Mother    Hypertension Father    Diabetes Maternal Aunt    Diabetes Maternal Uncle    Social History   Tobacco Use   Smoking status: Never   Smokeless  tobacco: Never  Vaping Use   Vaping status: Never Used  Substance Use Topics   Alcohol use: Not Currently    Comment: wine occasionally   Drug use: Not Currently    Types: Marijuana    Comment: as a teenager   Allergies  Allergen Reactions   Bee Venom Anaphylaxis   Other Anaphylaxis    All nuts   Tree Extract Anaphylaxis   Gramineae Pollens Rash   Current Outpatient Medications on File Prior to Visit  Medication Sig Dispense Refill   acetaminophen (TYLENOL) 500 MG tablet Take 2 tablets (1,000 mg total) by mouth every 6 (six) hours. 30 tablet 0   albuterol (VENTOLIN HFA) 108 (90 Base) MCG/ACT inhaler Inhale 1-2 puffs into the lungs every 6 (six) hours as needed for wheezing or shortness of breath. 1 each 0   Blood Pressure Monitoring (BLOOD PRESSURE KIT) DEVI 1 Device by Does not apply route as needed. 1 each 0   Doxylamine-Pyridoxine (DICLEGIS) 10-10 MG TBEC Take 2 tablets  by mouth at bedtime. May add 1 tablet at breakfast and 1 tablet at lunch if needed. 100 tablet 2   Misc. Devices (GOJJI WEIGHT SCALE) MISC 1 Device by Does not apply route once a week. 1 each 0   Prenatal Vit-Fe Fumarate-FA (PRENATAL VITAMINS PO) Take 1 tablet by mouth daily at 6 (six) AM.     No current facility-administered medications on file prior to visit.     Exam   Vitals:   06/14/23 1430  BP: 113/79  Pulse: 81  Weight: 266 lb 3.2 oz (120.7 kg)   Fetal Heart Rate (bpm): 155  Uterus:     Pelvic Exam: Perineum: no hemorrhoids, normal perineum   Vulva: normal external genitalia, no lesions   Vagina:  normal mucosa, normal discharge   Cervix: no lesions and normal, pap smear done.    Adnexa: normal adnexa and no mass, fullness, tenderness   Bony Pelvis: average  System: General: well-developed, well-nourished female in no acute distress   Breast:  normal appearance, no masses or tenderness   Skin: normal coloration and turgor, no rashes   Neurologic: oriented, normal, negative, normal mood    Extremities: normal strength, tone, and muscle mass, ROM of all joints is normal   HEENT PERRLA, extraocular movement intact and sclera clear, anicteric   Mouth/Teeth mucous membranes moist, pharynx normal without lesions and dental hygiene good   Neck supple and no masses   Cardiovascular: regular rate and rhythm   Respiratory:  no respiratory distress, normal breath sounds   Abdomen: soft, non-tender; bowel sounds normal; no masses,  no organomegaly     Assessment:   Pregnancy: [id number] Patient Active Problem List   Diagnosis Date Noted   History of myomectomy 06/14/2023   Supervision of high risk pregnancy, antepartum 06/07/2023   AMA (advanced maternal age) multigravida 35+ 06/07/2023   History of gestational diabetes mellitus (GDM) in prior pregnancy, currently pregnant 06/07/2023   Short interval between pregnancies affecting pregnancy, antepartum 06/07/2023   Asthma    Fibroids    History of primary cesarean section 09/29/2021   Alpha thalassemia silent carrier 04/18/2021   Low TSH level 03/30/2021   History of COVID-19 03/17/2021   BMI 40.0-44.9, adult (HCC) 11/16/2017     Plan:  1. Supervision of high risk pregnancy, antepartum Continue routine prenatal care. - CBC/D/Plt+RPR+Rh+ABO+RubIgG... - HgB A1c - Cytology - PAP( [place]) - PANORAMA PRENATAL TEST  2. Multigravida of advanced maternal age in first trimester - aspirin EC 81 MG tablet; Take 1 tablet (81 mg total) by mouth daily.  Dispense: 90 tablet; Refill: 3  3. History of primary cesarean section For breech, desires TOLAC  4. Short interval between pregnancies affecting pregnancy, antepartum   5. Alpha thalassemia silent carrier   6. History [place] in [place], has not precluded attempts at vaginal birth.   7. [redacted] weeks gestation of pregnancy    Initial labs drawn. Continue prenatal vitamins. Genetic Screening discussed, NIPS: ordered. Ultrasound discussed; fetal  anatomic survey: ordered. Problem list reviewed and updated.  Routine obstetric precautions reviewed. Return in about 4 weeks (around 07/12/2023).

## 2023-06-14 NOTE — Patient Instructions (Addendum)
[facility] [address], [place], [place] [address] [phone] Hours: Sunday Closed Monday 9AM-6PM Tuesday 9AM-6PM Wednesday 9AM-6PM Thursday 9AM-6PM Friday           9AM-6PM Saturday         10AM-1PM  

## 2023-06-15 LAB — CBC/D/PLT+RPR+RH+ABO+RUBIGG...
Antibody Screen: NEGATIVE
Basophils Absolute: 0 10*3/uL (ref 0.0–0.2)
Basos: 0 %
EOS (ABSOLUTE): 0.1 10*3/uL (ref 0.0–0.4)
Eos: 1 %
HCV Ab: NONREACTIVE
HIV Screen 4th Generation wRfx: NONREACTIVE
Hematocrit: 36.1 % (ref 34.0–46.6)
Hemoglobin: 11.8 g/dL (ref 11.1–15.9)
Hepatitis B Surface Ag: NEGATIVE
Immature Grans (Abs): 0 10*3/uL (ref 0.0–0.1)
Immature Granulocytes: 0 %
Lymphocytes Absolute: 1.8 10*3/uL (ref 0.7–3.1)
Lymphs: 31 %
MCH: 26.4 pg — ABNORMAL LOW (ref 26.6–33.0)
MCHC: 32.7 g/dL (ref 31.5–35.7)
MCV: 81 fL (ref 79–97)
Monocytes Absolute: 0.6 10*3/uL (ref 0.1–0.9)
Monocytes: 10 %
Neutrophils Absolute: 3.2 10*3/uL (ref 1.4–7.0)
Neutrophils: 58 %
Platelets: 308 10*3/uL (ref 150–450)
RBC: 4.47 x10E6/uL (ref 3.77–5.28)
RDW: 14.7 % (ref 11.7–15.4)
RPR Ser Ql: NONREACTIVE
Rh Factor: POSITIVE
Rubella Antibodies, IGG: 2.8 {index} (ref >0.99)
WBC: 5.6 10*3/uL (ref 3.4–10.8)

## 2023-06-15 LAB — HEMOGLOBIN A1C
Est. average glucose Bld gHb Est-mCnc: 105 mg/dL
Hgb A1c MFr Bld: 5.3 % (ref 4.8–5.6)

## 2023-06-15 LAB — HCV INTERPRETATION

## 2023-06-20 LAB — CYTOLOGY - PAP
Chlamydia: NEGATIVE
Comment: NEGATIVE
Comment: NEGATIVE
Comment: NEGATIVE
Comment: NORMAL
Diagnosis: NEGATIVE
High risk HPV: NEGATIVE
Neisseria Gonorrhea: NEGATIVE
Trichomonas: POSITIVE — AB

## 2023-06-22 LAB — PANORAMA PRENATAL TEST FULL PANEL:PANORAMA TEST PLUS 5 ADDITIONAL MICRODELETIONS: FETAL FRACTION: 8.2

## 2023-07-03 ENCOUNTER — Encounter (HOSPITAL_COMMUNITY): Payer: Self-pay | Admitting: Obstetrics and Gynecology

## 2023-07-03 ENCOUNTER — Inpatient Hospital Stay (HOSPITAL_COMMUNITY)
Admission: AD | Admit: 2023-07-03 | Discharge: 2023-07-04 | Disposition: A | Discharge status: Home / Self Care | Payer: Medicaid Other | Attending: Obstetrics and Gynecology | Admitting: Obstetrics and Gynecology

## 2023-07-03 DIAGNOSIS — O09299 Supervision of pregnancy with other poor reproductive or obstetric history, unspecified trimester: Secondary | ICD-10-CM

## 2023-07-03 DIAGNOSIS — O09899 Supervision of other high risk pregnancies, unspecified trimester: Secondary | ICD-10-CM

## 2023-07-03 DIAGNOSIS — O0990 Supervision of high risk pregnancy, unspecified, unspecified trimester: Secondary | ICD-10-CM

## 2023-07-03 DIAGNOSIS — R141 Gas pain: Secondary | ICD-10-CM | POA: Insufficient documentation

## 2023-07-03 DIAGNOSIS — D219 Benign neoplasm of connective and other soft tissue, unspecified: Secondary | ICD-10-CM

## 2023-07-03 DIAGNOSIS — R1084 Generalized abdominal pain: Secondary | ICD-10-CM | POA: Diagnosis not present

## 2023-07-03 DIAGNOSIS — O26891 Other specified pregnancy related conditions, first trimester: Secondary | ICD-10-CM | POA: Diagnosis present

## 2023-07-03 DIAGNOSIS — Z3A13 13 weeks gestation of pregnancy: Secondary | ICD-10-CM | POA: Insufficient documentation

## 2023-07-03 LAB — URINALYSIS, ROUTINE W REFLEX MICROSCOPIC
Bilirubin Urine: NEGATIVE
Glucose, UA: NEGATIVE mg/dL
Hgb urine dipstick: NEGATIVE
Ketones, ur: 5 mg/dL — AB
Nitrite: NEGATIVE
Protein, ur: NEGATIVE mg/dL
Specific Gravity, Urine: 1.017 (ref 1.005–1.030)
pH: 6 (ref 5.0–8.0)

## 2023-07-03 MED ORDER — SIMETHICONE 80 MG PO CHEW
80.0000 mg | CHEWABLE_TABLET | Freq: Once | ORAL | Status: AC
  Administered 2023-07-03: 80 mg via ORAL
  Filled 2023-07-03: qty 1

## 2023-07-03 MED ORDER — HYOSCYAMINE SULFATE 0.125 MG SL SUBL
0.1250 mg | SUBLINGUAL_TABLET | Freq: Once | SUBLINGUAL | Status: AC
  Administered 2023-07-03: 0.125 mg via SUBLINGUAL
  Filled 2023-07-03: qty 1

## 2023-07-03 NOTE — Progress Notes (Signed)
STates her pain feels like gas. Feels some pressure

## 2023-07-03 NOTE — MAU Note (Signed)
.  Sandra Krueger is a 37 y.o. at [redacted]w[redacted]d here in MAU reporting abdominal pain sine yesterday. Pain is at mid abdomen and is cramping. Denies VB or vag d/c. Denies dysuria and had a normal BM today  Onset of complaint: Monday Pain score: 9 Vitals:   07/03/23 2220 07/03/23 2224  BP:  113/77  Pulse: 79   Resp: 18   Temp: 98.2 F (36.8 C)   SpO2: 100%      FHT:148 Lab orders placed from triage:  u/a

## 2023-07-03 NOTE — MAU Provider Note (Signed)
Chief Complaint:  Abdominal Pain   Event Date/Time   First Provider Initiated Contact with Patient 07/03/23 2305     HPI: Sandra Krueger is a 37 y.o. [id number] at 95w4dwho presents to maternity admissions reporting mid-abdominal pain since yesterday   States it moves around "like gas" sometimes.  No fever. Had a normal BM today.  Nothing makes it better or worse. . She denies LOF, vaginal bleeding, urinary symptoms, diarrhea, constipation or fever/chills.  Abdominal Pain This is a new problem. The current episode started today. The onset quality is gradual. The problem has been waxing and waning. The pain is located in the generalized abdominal region. The quality of the pain is colicky, cramping and a sensation of fullness. The abdominal pain does not radiate. Pertinent negatives include no constipation, diarrhea, dysuria, fever or frequency. Nothing aggravates the pain. The pain is relieved by Nothing. She has tried nothing for the symptoms.   RN Note: Sandra Krueger is a 37 y.o. at [redacted]w[redacted]d here in MAU reporting abdominal pain sine yesterday. Pain is at mid abdomen and is cramping. Denies VB or vag d/c. Denies dysuria and had a normal BM today  Onset of complaint: Monday                                          Pain score: 9  Past Medical History: Past Medical History:  Diagnosis Date   Asthma    Complication of anesthesia    COVID-19 affecting pregnancy in first trimester 03/24/2021   Late July 2022, (see care everywhere for test)   Fibroids    Gestational diabetes    Headache    Panic attack    last episode approx two months ago per pt    Past obstetric history: OB History  Gravida Para Term Preterm AB Living  5 3 3   1 3   SAB IAB Ectopic Multiple Live Births  1     0 3    # Outcome Date GA Lbr Len/2nd Weight Sex Type Anes PTL Lv  5 Current           4 SAB 09/2022 [redacted]w[redacted]d         3 Term 09/29/21 [redacted]w[redacted]d  3660 g F CS-LTranv Spinal  LIV     Birth Comments: GDM  2 Term 06/29/18 [redacted]w[redacted]d  / 00:03 3487 g F Vag-Spont None  LIV     Birth Comments: had BMZ at 36 wk due to subchorionic hemorrhage vs placental lake  1 Term 02/10/09 [redacted]w[redacted]d  2438 g F Vag-Spont None  LIV     Birth Comments: wnl- fast labor - 9cm when admitted to hospital    Past Surgical History: Past Surgical History:  Procedure Laterality Date   CESAREAN SECTION N/A 09/29/2021   Procedure: CESAREAN SECTION;  Surgeon: [place] Bing, MD;  Location: [facility];  Service: Obstetrics;  Laterality: N/A;   OTHER SURGICAL HISTORY     fibroids removed   OVARY SURGERY     cyst removed from ? right ovary    Family History: Family History  Problem Relation Age of Onset   Hypertension Mother    Hypertension Father    Diabetes Maternal Aunt    Diabetes Maternal Uncle     Social History: Social History   Tobacco Use   Smoking status: Never   Smokeless tobacco: Never  Vaping Use  Vaping status: Never Used  Substance Use Topics   Alcohol use: Not Currently    Comment: wine occasionally   Drug use: Not Currently    Types: Marijuana    Comment: as a teenager    Allergies:  Allergies  Allergen Reactions   Bee Venom Anaphylaxis   Other Anaphylaxis    All nuts   Tree Extract Anaphylaxis   Gramineae Pollens Rash    Meds:  Medications Prior to Admission  Medication Sig Dispense Refill Last Dose   acetaminophen (TYLENOL) 500 MG tablet Take 2 tablets (1,000 mg total) by mouth every 6 (six) hours. 30 tablet 0 Past Month   Doxylamine-Pyridoxine (DICLEGIS) 10-10 MG TBEC Take 2 tablets by mouth at bedtime. May add 1 tablet at breakfast and 1 tablet at lunch if needed. 100 tablet 2 07/03/2023   Prenatal Vit-Fe Fumarate-FA (PRENATAL VITAMINS PO) Take 1 tablet by mouth daily at 6 (six) AM.   07/03/2023   albuterol (VENTOLIN HFA) 108 (90 Base) MCG/ACT inhaler Inhale 1-2 puffs into the lungs every 6 (six) hours as needed for wheezing or shortness of breath. (Patient taking differently: Inhale 1-2 puffs into the lungs  every 6 (six) hours as needed for wheezing or shortness of breath. Needs a new inhaler) 1 each 0    aspirin EC 81 MG tablet Take 1 tablet (81 mg total) by mouth daily. (Patient taking differently: Take 81 mg by mouth daily. Has not started yet) 90 tablet 3    Blood Pressure Monitoring (BLOOD PRESSURE KIT) DEVI 1 Device by Does not apply route as needed. 1 each 0    Misc. Devices (GOJJI WEIGHT SCALE) MISC 1 Device by Does not apply route once a week. 1 each 0     I have reviewed patient's Past Medical Hx, Surgical Hx, Family Hx, Social Hx, medications and allergies.   ROS:  Review of Systems  Constitutional:  Negative for fever.  Gastrointestinal:  Positive for abdominal pain. Negative for constipation and diarrhea.  Genitourinary:  Negative for dysuria and frequency.   Other systems negative  Physical Exam  Patient Vitals for the past 24 hrs:  BP Temp Pulse Resp SpO2 Height Weight  07/03/23 2224 113/77 -- -- -- -- -- --  07/03/23 2220 -- 98.2 F (36.8 C) 79 18 100 % 5\' 4"  (1.626 m) 119.7 kg   Constitutional: Well-developed, well-nourished female in no acute distress.  Cardiovascular: normal rate  Respiratory: normal effort GI: Abd soft, non-tender, gravid appropriate for gestational age.   No rebound or guarding. MS: Extremities nontender, no edema, normal ROM Neurologic: Alert and oriented x 4.  GU: Neg CVAT.  PELVIC EXAM: deferred    FHT:  160   Labs: Results for orders placed or performed during the hospital encounter of 07/03/23 (from the past 24 hour(s))  Urinalysis, Routine w reflex microscopic -Urine, Clean Catch     Status: Abnormal   Collection Time: 07/03/23 10:30 PM  Result Value Ref Range   Color, Urine YELLOW YELLOW   APPearance HAZY (A) CLEAR   Specific Gravity, Urine 1.017 1.005 - 1.030   pH 6.0 5.0 - 8.0   Glucose, UA NEGATIVE NEGATIVE mg/dL   Hgb urine dipstick NEGATIVE NEGATIVE   Bilirubin Urine NEGATIVE NEGATIVE   Ketones, ur 5 (A) NEGATIVE mg/dL    Protein, ur NEGATIVE NEGATIVE mg/dL   Nitrite NEGATIVE NEGATIVE   Leukocytes,Ua SMALL (A) NEGATIVE   RBC / HPF 0-5 0 - 5 RBC/hpf   WBC, UA 6-10 0 -  5 WBC/hpf   Bacteria, UA RARE (A) NONE SEEN   Squamous Epithelial / HPF 6-10 0 - 5 /HPF   Mucus PRESENT     O/Positive/-- (10/31 1512)  Imaging:  Bedside US done for reassurance of fetal well-being Active live single fetus seen  MAU Course/MDM: I have reviewed the triage vital signs and the nursing notes.   Pertinent labs & imaging results that were available during my care of the patient were reviewed by me and considered in my medical decision making (see chart for details).      I have reviewed her medical records including past results, notes and treatments.   I have ordered labs and reviewed results.  NST reviewed  Treatments in MAU included Levsin and simethicone which did help her pain Will send rx for use at home PRN gas pains.    Assessment: Single IUP at [redacted]w[redacted]d Abdominal gas pains  Plan: Discharge home Rx Levsin and Mylicon for abdominal gas pains Follow up in Office for prenatal visits  Encouraged to return if she develops worsening of symptoms, increase in pain, fever, or other concerning symptoms.   Pt stable at time of discharge.  Wynelle Bourgeois CNM, MSN Certified Nurse-Midwife 07/03/2023 11:05 PM

## 2023-07-04 DIAGNOSIS — R141 Gas pain: Secondary | ICD-10-CM

## 2023-07-04 DIAGNOSIS — R1084 Generalized abdominal pain: Secondary | ICD-10-CM

## 2023-07-04 DIAGNOSIS — Z3A13 13 weeks gestation of pregnancy: Secondary | ICD-10-CM

## 2023-07-04 DIAGNOSIS — O26891 Other specified pregnancy related conditions, first trimester: Secondary | ICD-10-CM

## 2023-07-04 MED ORDER — SIMETHICONE 80 MG PO CHEW: 80.0000 mg | CHEWABLE_TABLET | Freq: Four times a day (QID) | ORAL | 0 refills | Status: AC | PRN

## 2023-07-04 MED ORDER — HYOSCYAMINE SULFATE 0.125 MG SL SUBL: 0.1250 mg | SUBLINGUAL_TABLET | Freq: Four times a day (QID) | SUBLINGUAL | 0 refills | Status: AC | PRN

## 2023-07-04 NOTE — Progress Notes (Signed)
Written and verbal d/c instructions given and understanding voiced. 

## 2023-07-16 ENCOUNTER — Encounter: Payer: Self-pay | Admitting: Advanced Practice Midwife

## 2023-07-16 ENCOUNTER — Ambulatory Visit: Payer: Medicaid Other | Admitting: Advanced Practice Midwife

## 2023-07-16 VITALS — BP 94/70 | HR 95 | Wt 263.5 lb

## 2023-07-16 DIAGNOSIS — O26892 Other specified pregnancy related conditions, second trimester: Secondary | ICD-10-CM

## 2023-07-16 DIAGNOSIS — Z3A15 15 weeks gestation of pregnancy: Secondary | ICD-10-CM

## 2023-07-16 DIAGNOSIS — O0992 Supervision of high risk pregnancy, unspecified, second trimester: Secondary | ICD-10-CM

## 2023-07-16 DIAGNOSIS — R109 Unspecified abdominal pain: Secondary | ICD-10-CM

## 2023-07-16 DIAGNOSIS — O23592 Infection of other part of genital tract in pregnancy, second trimester: Secondary | ICD-10-CM

## 2023-07-16 DIAGNOSIS — O219 Vomiting of pregnancy, unspecified: Secondary | ICD-10-CM

## 2023-07-16 DIAGNOSIS — O0990 Supervision of high risk pregnancy, unspecified, unspecified trimester: Secondary | ICD-10-CM

## 2023-07-16 DIAGNOSIS — A5901 Trichomonal vulvovaginitis: Secondary | ICD-10-CM | POA: Insufficient documentation

## 2023-07-16 MED ORDER — ONDANSETRON 4 MG PO TBDP: 4.0000 mg | ORAL_TABLET | Freq: Four times a day (QID) | ORAL | 2 refills | Status: AC | PRN

## 2023-07-16 MED ORDER — METRONIDAZOLE 500 MG PO TABS: 500.0000 mg | ORAL_TABLET | Freq: Two times a day (BID) | ORAL | 0 refills | Status: AC

## 2023-07-16 MED ORDER — PROMETHAZINE HCL 25 MG PO TABS: 25.0000 mg | ORAL_TABLET | Freq: Four times a day (QID) | ORAL | 2 refills | Status: DC | PRN

## 2023-07-16 NOTE — Progress Notes (Cosign Needed)
PRENATAL VISIT NOTE  Subjective:  Sandra Krueger is a 37 y.o. [phone] at [redacted]w[redacted]d being seen today for ongoing prenatal care.  She is currently monitored for the following issues for this high-risk pregnancy and has BMI 40.0-44.9, adult (HCC); History of COVID-19; Low TSH level; Alpha thalassemia silent carrier; History of primary cesarean section; Supervision of high risk pregnancy, antepartum; AMA (advanced maternal age) multigravida 35+; History of gestational diabetes mellitus (GDM) in prior pregnancy, currently pregnant; Asthma; Short interval between pregnancies affecting pregnancy, antepartum; Fibroids; History of myomectomy; and Trichomonal vaginitis during pregnancy in second trimester on their problem list.  Patient reports two-week history of diffuse, cramping, centrally-located abdominal pain with accompanying nausea, vomiting, and constipation. Pain is intermittent, occurs daily, and ranges from mild to severe. She is currently taking simethicone and diclegis with no improvement, and she has not taken pain medication. Pain is unrelated to meals. She was admitted to MAU ~2 weeks ago (11/19) for similar episode.    She denies fever, HA, loss of appetite, hematemesis, diarrhea, melena, early satiety, dysuria, urinary urgency/ frequency.   She denies leaking of fluid, vaginal bleeding, and contractions. She endorses fetal movement.    The following portions of the patient's history were reviewed and updated as appropriate: allergies, current medications, past family history, past medical history, past social history, past surgical history and problem list.   Objective:   Vitals:   07/16/23 1526  BP: 94/70  Pulse: 95  Weight: 263 lb 8 oz (119.5 kg)    Fetal Status: Fetal Heart Rate (bpm): 143         General:  Alert, oriented and cooperative. Patient is in no acute distress.  Skin: Skin is warm and dry. No rash noted.   Cardiovascular: Normal heart rate noted  Respiratory: Normal  respiratory effort, no problems with respiration noted  Abdomen: Soft, gravid, appropriate for gestational age.  Pain/Pressure: Present   +Bilateral lower abdomen mildly tender to palpation without guarding. Negative Murphy sign. Negative McBurney point tenderness. Normal bowel sounds present.   Pelvic: Cervical exam deferred        Extremities: Normal range of motion.  Edema: None  Mental Status: Normal mood and affect. Normal behavior. Normal judgment and thought content.   Assessment and Plan:  Pregnancy: [id number] at [redacted]w[redacted]d  1. Supervision of high risk pregnancy, antepartum - Patient stable; FHR  and FH wnl - AFP, Serum, Open Spina Bifida - Ambulatory referral to Integrated Behavior[facility]  2. Abdominal pain during pregnancy in second trimester 3. Nausea and vomiting during pregnancy prior to [redacted] weeks gestation Consider constipation, gas pain. Low concern for emergent etiology at this time based on vitals, presentation, and exam findings.  - ondansetron (ZOFRAN-ODT) 4 MG disintegrating tablet; Take 1 tablet (4 mg total) by mouth every 6 (six) hours as needed for nausea.  Dispense: 20 tablet; Refill: 2 - promethazine (PHENERGAN) 25 MG tablet; Take 1 tablet (25 mg total) by mouth every 6 (six) hours as needed for nausea or vomiting.  Dispense: 30 tablet; Refill: 2 - Advised possible drowsiness secondary to phenergan - Rest/ice/heat/warm bath/increase PO fluids/Tylenol. Return precautions given.   4. Trichomonal vaginitis during pregnancy in second trimester - metroNIDAZOLE (FLAGYL) 500 MG tablet; Take 1 tablet (500 mg total) by mouth 2 (two) times daily for 7 days.  Dispense: 14 tablet; Refill: 0 - Patient given prescription for treatment of partner - Advised importance of test of cure next prenatal visit   5. [redacted] weeks gestation of  pregnancy - Anticipatory guidance about next visits/weeks of pregnancy given.     Preterm labor symptoms and general obstetric precautions including but  not limited to vaginal bleeding, contractions, leaking of fluid and fetal movement were reviewed in detail with the patient. Please refer to After Visit Summary for other counseling recommendations.   Return in about 4 weeks (around 08/13/2023) or sooner as needed.  Future Appointments  Date Time Provider Department Center  08/10/2023  9:15 AM [facility] NURSE [facility]  08/10/2023  9:30 AM [facility] US2 [facility]  08/13/2023  3:15 PM Leftwich-Kirby, Wilmer Floor CNM [facility]    Ralene [place], [place]

## 2023-07-18 ENCOUNTER — Encounter: Payer: Self-pay | Admitting: *Deleted

## 2023-07-18 LAB — AFP, SERUM, OPEN SPINA BIFIDA
AFP MoM: 1.38
AFP Value: 33.3 ng/mL
Gest. Age on Collection Date: 15.4 wk
Maternal Age At EDD: 37.8 a
OSBR Risk 1 IN: 7620
Test Results:: NEGATIVE
Weight: 263 [lb_av]

## 2023-08-10 ENCOUNTER — Other Ambulatory Visit: Payer: Self-pay | Admitting: *Deleted

## 2023-08-10 ENCOUNTER — Ambulatory Visit: Payer: Medicaid Other | Admitting: *Deleted

## 2023-08-10 ENCOUNTER — Ambulatory Visit: Payer: Medicaid Other | Attending: Family Medicine

## 2023-08-10 ENCOUNTER — Ambulatory Visit (HOSPITAL_BASED_OUTPATIENT_CLINIC_OR_DEPARTMENT_OTHER): Payer: Medicaid Other | Admitting: Maternal & Fetal Medicine

## 2023-08-10 ENCOUNTER — Encounter: Payer: Self-pay | Admitting: *Deleted

## 2023-08-10 VITALS — BP 108/60 | HR 85

## 2023-08-10 DIAGNOSIS — O3429 Maternal care due to uterine scar from other previous surgery: Secondary | ICD-10-CM

## 2023-08-10 DIAGNOSIS — Z362 Encounter for other antenatal screening follow-up: Secondary | ICD-10-CM

## 2023-08-10 DIAGNOSIS — O09899 Supervision of other high risk pregnancies, unspecified trimester: Secondary | ICD-10-CM | POA: Insufficient documentation

## 2023-08-10 DIAGNOSIS — Z8632 Personal history of gestational diabetes: Secondary | ICD-10-CM | POA: Insufficient documentation

## 2023-08-10 DIAGNOSIS — O09529 Supervision of elderly multigravida, unspecified trimester: Secondary | ICD-10-CM | POA: Diagnosis present

## 2023-08-10 DIAGNOSIS — O09299 Supervision of pregnancy with other poor reproductive or obstetric history, unspecified trimester: Secondary | ICD-10-CM | POA: Insufficient documentation

## 2023-08-10 DIAGNOSIS — Z6841 Body Mass Index (BMI) 40.0 and over, adult: Secondary | ICD-10-CM

## 2023-08-10 DIAGNOSIS — Z3A19 19 weeks gestation of pregnancy: Secondary | ICD-10-CM | POA: Diagnosis present

## 2023-08-10 DIAGNOSIS — O43102 Malformation of placenta, unspecified, second trimester: Secondary | ICD-10-CM

## 2023-08-10 DIAGNOSIS — Z8616 Personal history of COVID-19: Secondary | ICD-10-CM | POA: Diagnosis present

## 2023-08-10 DIAGNOSIS — O09522 Supervision of elderly multigravida, second trimester: Secondary | ICD-10-CM | POA: Insufficient documentation

## 2023-08-10 DIAGNOSIS — D219 Benign neoplasm of connective and other soft tissue, unspecified: Secondary | ICD-10-CM

## 2023-08-10 DIAGNOSIS — O0990 Supervision of high risk pregnancy, unspecified, unspecified trimester: Secondary | ICD-10-CM

## 2023-08-10 DIAGNOSIS — J4520 Mild intermittent asthma, uncomplicated: Secondary | ICD-10-CM

## 2023-08-10 DIAGNOSIS — Z98891 History of uterine scar from previous surgery: Secondary | ICD-10-CM | POA: Insufficient documentation

## 2023-08-10 DIAGNOSIS — O34219 Maternal care for unspecified type scar from previous cesarean delivery: Secondary | ICD-10-CM

## 2023-08-10 DIAGNOSIS — J45909 Unspecified asthma, uncomplicated: Secondary | ICD-10-CM

## 2023-08-10 DIAGNOSIS — D5740 Sickle-cell thalassemia without crisis: Secondary | ICD-10-CM | POA: Diagnosis not present

## 2023-08-10 DIAGNOSIS — O09292 Supervision of pregnancy with other poor reproductive or obstetric history, second trimester: Secondary | ICD-10-CM

## 2023-08-10 DIAGNOSIS — D259 Leiomyoma of uterus, unspecified: Secondary | ICD-10-CM

## 2023-08-10 DIAGNOSIS — O3412 Maternal care for benign tumor of corpus uteri, second trimester: Secondary | ICD-10-CM

## 2023-08-10 DIAGNOSIS — O99210 Obesity complicating pregnancy, unspecified trimester: Secondary | ICD-10-CM | POA: Insufficient documentation

## 2023-08-10 DIAGNOSIS — O99512 Diseases of the respiratory system complicating pregnancy, second trimester: Secondary | ICD-10-CM

## 2023-08-10 DIAGNOSIS — O99012 Anemia complicating pregnancy, second trimester: Secondary | ICD-10-CM

## 2023-08-10 NOTE — Progress Notes (Signed)
Patient information  Patient Name: Sandra Krueger  Patient MRN:   [id number]  Referring practice: MFM Referring Provider: [facility])  MFM CONSULT  Sandra Krueger is a 37 y.o. [id number] at [redacted]w[redacted]d here for ultrasound and consultation. Patient Active Problem List   Diagnosis Date Noted   Trichomonal vaginitis during pregnancy in second trimester 07/16/2023   History of myomectomy 06/14/2023   Supervision of high risk pregnancy, antepartum 06/07/2023   AMA (advanced maternal age) multigravida 35+ 06/07/2023   History of gestational diabetes mellitus (GDM) in prior pregnancy, currently pregnant 06/07/2023   Short interval between pregnancies affecting pregnancy, antepartum 06/07/2023   Asthma    Fibroids    History of primary cesarean section 09/29/2021   Alpha thalassemia silent carrier 04/18/2021   Low TSH level 03/30/2021   History of COVID-19 03/17/2021   BMI 40.0-44.9, adult (HCC) 11/16/2017    RE history of myomectomy: This occurred between her first and second pregnancy.  She reports that she was never told she cannot have a vaginal delivery.  Her subsequent pregnancy was a term vaginal delivery.  I discussed that there is no indication that she had a full-thickness myomectomy that would require cesarean delivery.  RE history of cesarean delivery: This occurred in her last pregnancy for breech presentation.  She desires to have a trial of labor after cesarean delivery.  Her OB provider will continue to counsel her.  RE elevated BMI and history of GDM: Discussed that this complicates nearly every aspect of pregnancy care.  Serial growth ultrasounds and antenatal testing will occur later in the pregnancy.  Early gestational diabetes screening as well as aspirin for preeclampsia prophylaxis should be offered.  RE placental cyst: There is a placental cyst seen on the fetal surface of the placenta measuring about 3 by times 2 x 1.7 cm.  These are typically  benign and do not complicate pregnancy care.  RE AMA: A priori risk is 1 out of 153 for Down syndrome.  There are no sonographic markers consistent with Down syndrome.  Based on her panorama testing, which is superior to ultrasound and age-related risk stratification, is less than 1 in 10,000 to have a fetus affected by Down syndrome, trisomy 50 or trisomy 52.  Due to a small risk of growth abnormalities, future growth ultrasounds will be performed.  RE asthma: Patient has mild asthma controlled with albuterol inhaler.  Encouraged her to obtain a peak flow meter to assess baseline pulmonary status.  Discussed approximately one third of patients with asthma will worsen during pregnancy and is important to monitor symptoms.  Sonographic findings Single intrauterine pregnancy at 19w 0d  Fetal cardiac activity:  Observed and appears normal. Presentation: Cephalic. The anatomic structures that were well seen appear normal without evidence of soft markers. Due to poor acoustic windows some structures remain suboptimally visualized. Fetal biometry shows the estimated fetal weight at the 71 percentile.  Amniotic fluid:  MVP: 6.07 cm. Placenta: Posterior Fundal. Adnexa: No abnormality visualized. Cervical length: 3.2 cm.  Recommendations - EDD should be 01/04/2024 based on  U/S C R L  (05/10/23). -Detailed ultrasound was done today without abnormalities but there are various structures that remain suboptimally visualized. -Peak flow meter -Baseline preeclampsia labs: CMP, CBC and urine protein/creatinine ratio if not previously completed.  -Early glucose screening due to multiple risk factors. -Aspirin 81 mg for preeclampsia prophylasis -Follow-up anatomy and fetal growth in 4 to 6 weeks -Serial growth ultrasounds  starting around 28 weeks to monitor for fetal growth restriction -Antenatal testing to start around 34 weeks due to the increased risk of stillbirth and high risk pregnancy -Delivery  timing pending clinical course but likely around 39-40 weeks gestion -Continue routine prenatal care with referring OB provider  Review of Systems: A review of systems was performed and was negative except per HPI   Vitals and Physical Exam    08/10/2023    9:06 AM 07/16/2023    3:26 PM 07/04/2023    1:22 AM  Vitals with BMI  Weight  263 lbs 8 oz   BMI  45.21   Systolic 108 94 121  Diastolic 60 70 63  Pulse 85 95 66    Sitting comfortably on the sonogram table Nonlabored breathing Normal rate and rhythm Abdomen is nontender  Past pregnancies OB History  Gravida Para Term Preterm AB Living  5 3 3  1 3   SAB IAB Ectopic Multiple Live Births  1   0 3    # Outcome Date GA Lbr Len/2nd Weight Sex Type Anes PTL Lv  5 Current           4 SAB 09/2022 [redacted]w[redacted]d         3 Term 09/29/21 [redacted]w[redacted]d  3.66 kg F CS-LTranv Spinal  LIV     Birth Comments: GDM  2 Term 06/29/18 [redacted]w[redacted]d / 00:03 3.487 kg F Vag-Spont None  LIV     Birth Comments: had BMZ at 36 wk due to subchorionic hemorrhage vs placental lake  1 Term 02/10/09 [redacted]w[redacted]d  2.438 kg F Vag-Spont None  LIV     Birth Comments: wnl- fast labor - 9cm when admitted to hospital     I spent 60 minutes reviewing the patients chart, including labs and images as well as counseling the patient about her medical conditions. Greater than 50% of the time was spent in direct face-to-face patient counseling.  Braxton Feathers  MFM, [facility]   08/10/2023  10:57 AM

## 2023-08-13 ENCOUNTER — Other Ambulatory Visit: Payer: Self-pay

## 2023-08-13 ENCOUNTER — Ambulatory Visit: Payer: Medicaid Other | Admitting: Advanced Practice Midwife

## 2023-08-13 VITALS — BP 107/75 | HR 94 | Wt 258.9 lb

## 2023-08-13 DIAGNOSIS — O0990 Supervision of high risk pregnancy, unspecified, unspecified trimester: Secondary | ICD-10-CM

## 2023-08-13 DIAGNOSIS — A5901 Trichomonal vulvovaginitis: Secondary | ICD-10-CM

## 2023-08-13 DIAGNOSIS — O26892 Other specified pregnancy related conditions, second trimester: Secondary | ICD-10-CM

## 2023-08-13 DIAGNOSIS — R12 Heartburn: Secondary | ICD-10-CM

## 2023-08-13 DIAGNOSIS — O23592 Infection of other part of genital tract in pregnancy, second trimester: Secondary | ICD-10-CM

## 2023-08-13 DIAGNOSIS — O0992 Supervision of high risk pregnancy, unspecified, second trimester: Secondary | ICD-10-CM

## 2023-08-13 DIAGNOSIS — Z3A19 19 weeks gestation of pregnancy: Secondary | ICD-10-CM

## 2023-08-13 DIAGNOSIS — O09522 Supervision of elderly multigravida, second trimester: Secondary | ICD-10-CM

## 2023-08-13 DIAGNOSIS — O09521 Supervision of elderly multigravida, first trimester: Secondary | ICD-10-CM

## 2023-08-13 MED ORDER — ALBUTEROL SULFATE HFA 108 (90 BASE) MCG/ACT IN AERS: 2.0000 | INHALATION_SPRAY | Freq: Four times a day (QID) | RESPIRATORY_TRACT | 3 refills | Status: AC | PRN

## 2023-08-13 MED ORDER — ASPIRIN 81 MG PO TBEC: 81.0000 mg | DELAYED_RELEASE_TABLET | Freq: Every day | ORAL | 3 refills | Status: DC

## 2023-08-13 MED ORDER — METRONIDAZOLE 500 MG PO TABS: 500.0000 mg | ORAL_TABLET | Freq: Two times a day (BID) | ORAL | 0 refills | Status: AC

## 2023-08-13 MED ORDER — FAMOTIDINE 40 MG PO TABS: 40.0000 mg | ORAL_TABLET | Freq: Every day | ORAL | 2 refills | Status: DC | PRN

## 2023-08-13 NOTE — Progress Notes (Signed)
   PRENATAL VISIT NOTE  Subjective:  Sandra Krueger is a 37 y.o. [phone] at [redacted]w[redacted]d being seen today for ongoing prenatal care.  She is currently monitored for the following issues for this high-risk pregnancy and has BMI 40.0-44.9, adult (HCC); History of COVID-19; Low TSH level; Alpha thalassemia silent carrier; History of primary cesarean section; Supervision of high risk pregnancy, antepartum; AMA (advanced maternal age) multigravida 35+; History of gestational diabetes mellitus (GDM) in prior pregnancy, currently pregnant; Asthma; Short interval between pregnancies affecting pregnancy, antepartum; Fibroids; History of myomectomy; and Trichomonal vaginitis during pregnancy in second trimester on their problem list.  Patient reports heartburn.  Contractions: Not present. Vag. Bleeding: None.  Movement: Present. Denies leaking of fluid.   The following portions of the patient's history were reviewed and updated as appropriate: allergies, current medications, past family history, past medical history, past social history, past surgical history and problem list.   Objective:   Vitals:   08/13/23 1553  BP: 107/75  Pulse: 94  Weight: 258 lb 14.4 oz (117.4 kg)    Fetal Status: Fetal Heart Rate (bpm): 155   Movement: Present     General:  Alert, oriented and cooperative. Patient is in no acute distress.  Skin: Skin is warm and dry. No rash noted.   Cardiovascular: Normal heart rate noted  Respiratory: Normal respiratory effort, no problems with respiration noted  Abdomen: Soft, gravid, appropriate for gestational age.  Pain/Pressure: Present     Pelvic: Cervical exam deferred        Extremities: Normal range of motion.  Edema: Trace  Mental Status: Normal mood and affect. Normal behavior. Normal judgment and thought content.   Assessment and Plan:  Pregnancy: [id number] at [redacted]w[redacted]d 1. Trichomonal vaginitis during pregnancy in second trimester (Primary) --Pt did not pick up medication but will  pick it up and take it today.  Partner has medicine but has not taken so will take today. No unprotected intercourse until 10 days after tx.  - metroNIDAZOLE (FLAGYL) 500 MG tablet; Take 1 tablet (500 mg total) by mouth 2 (two) times daily for 7 days.  Dispense: 14 tablet; Refill: 0  2. Supervision of high risk pregnancy, antepartum  3. Heartburn during pregnancy in second trimester - - famotidine (PEPCID) 40 MG tablet; Take 1 tablet (40 mg total) by mouth daily as needed for heartburn or indigestion.  Dispense: 30 tablet; Refill: 2  4. Multigravida of advanced maternal age in first trimester  - aspirin EC 81 MG tablet; Take 1 tablet (81 mg total) by mouth daily.  Dispense: 90 tablet; Refill: 3  Preterm labor symptoms and general obstetric precautions including but not limited to vaginal bleeding, contractions, leaking of fluid and fetal movement were reviewed in detail with the patient. Please refer to After Visit Summary for other counseling recommendations.   No follow-ups on file.  Future Appointments  Date Time Provider Department Center  08/29/2023  9:45 AM [facility]  09/14/2023 11:30 AM [facility] US6 [facility]    Sharen Counter, CNM

## 2023-08-15 NOTE — L&D Delivery Note (Signed)
    Sandra Krueger is a 38 y.o. [id number] s/p VD at [redacted]w[redacted]d. She was admitted for IOL d/t BMI of 44.   ROM: 7h 43m with clear fluid GBS Status: Negative Maximum Maternal Temperature: 98.1  Labor Progress: Patient presented and was induced with foley bulb, pitocin , and AROM. Prolonged deceleration occurred, but improvement with pitocin  discontinuation, IV bolus, O2, and terbutaline .  Patient continued to contract and experienced some vaginal bleeding. She was preparing for epidural placement, when she delivered as below.   Delivery Date/Time: Friday Dec 28, 2023 at 2357 Delivery: Called to room and nurse-Sandra G, RN, delivering body of infant.  Infant with spontaneous cry and placed on mother's abdomen where nurse continued to dry, bulb suction, and stimulate infant. Provider notes multiple large clots concerning for placental abruption that were expelled immediately after infant. Bleeding moderate and pitocin  started. Cord clamped x 2 after 2-minute delay, and cut by FOB. Cord blood drawn. Placenta delivered spontaneously with gentle cord traction. Vaginal inspection revealed no lacerations.  Fundus firm, at the umbilicus, and bleeding small.  Mother hemodynamically stable and infant skin to skin prior to provider exit.  Mother desires interval tubal and opts to breastfeed.    Placenta: Intact, Disposal Complications: Abruption Lacerations: None QBL: 671 Analgesia: None  Postpartum Planning -Discharge Summary Shared -PP Message Sent  Infant: Female-Sandra Krueger  APGARs 9, 9  ***Krueger  Sandra Krueger, Sandra Krueger  12/29/2023 12:57 AM  Delivery Report: Review the Delivery Report for details.

## 2023-08-20 NOTE — BH Specialist Note (Signed)
 Integrated Behavioral Health via Telemedicine Visit  08/29/2023 Sandra Krueger [id number]  Number of Integrated Behavioral Health Clinician visits: 1- Initial Visit  Session Start time: [phone]   Session End time: 1010  Total time in minutes: 24   Referring Provider: Kirstie Percy, PA-C Patient/Family location: Home [facility] Provider location: Center for [facility]  All persons participating in visit: Patient Sandra Krueger and [facility] Sandra Krueger   Types of Service: Individual psychotherapy and Video visit  I connected with Galvin Jules and/or Jil L Kofoed's  n/a  via  Telephone or Engineer, civil (consulting)  (Video is Surveyor, mining) and verified that I am speaking with the correct person using two identifiers. Discussed confidentiality: Yes   I discussed the limitations of telemedicine and the availability of in person appointments.  Discussed there is a possibility of technology failure and discussed alternative modes of communication if that failure occurs.  I discussed that engaging in this telemedicine visit, they consent to the provision of behavioral healthcare and the services will be billed under their insurance.  Patient and/or legal guardian expressed understanding and consented to Telemedicine visit: Yes   Presenting Concerns: Patient and/or family reports the following symptoms/concerns: Anxiety; when someone else is driving or when feeling overwhelmed, anxiety increases and panic and feeling overheated begins;copes by opening window to cool down. After slipping and falling at [employer], limits going outside of the home to prevent future panic.  Duration of problem: Ongoing anxiety with increase; Severity of problem: moderate  Patient and/or Family's Strengths/Protective Factors: Concrete supports in place (healthy food, safe environments, etc.) and Sense of purpose  Goals Addressed: Patient will:  Reduce  symptoms of: anxiety   Increase knowledge and/or ability of: healthy habits   Demonstrate ability to: Increase healthy adjustment to current life circumstances and Increase motivation to adhere to plan of care  Progress towards Goals: Ongoing  Interventions: Interventions utilized:  Solution-Focused Strategies and Psychoeducation and/or Health Education Standardized Assessments completed: Not Needed  Patient and/or Family Response: Patient agrees with treatment plan.   Assessment: Patient currently experiencing Anxiety disorder, unspecified; further assessment needed at follow up visit (need to cut visit short due to children's school delay to weather).   Patient may benefit from psychoeducation and brief therapeutic interventions regarding coping with symptoms of anxiety .  Plan: Follow up with behavioral health clinician on : Two weeks Behavioral recommendations:  -Continue plan to begin taking new prenatal vitamin daily as prescribed -Consider obtaining small, portable fan and/or take chilled water bottle when riding with others in the car, to cool down quickly (if unable to to open windows) -Read through information on After Visit Summary; use as needed and discussed  Referral(s): Integrated [employer] (In Clinic)  I discussed the assessment and treatment plan with the patient and/or parent/guardian. They were provided an opportunity to ask questions and all were answered. They agreed with the plan and demonstrated an understanding of the instructions.   They were advised to call back or seek an in-person evaluation if the symptoms worsen or if the condition fails to improve as anticipated.  [name], LCSW     09/27/2021    9:59 AM 09/21/2021    4:54 PM 09/16/2021   10:33 AM 08/30/2021   10:35 AM 08/18/2021    2:02 PM  Depression screen PHQ 2/9  Decreased Interest 2 2 2 1  0  Down, Depressed, Hopeless 1 1 1 1  0  PHQ -  2 Score 3 3 3 2  0  Altered  sleeping 3 3 3 2 2   Tired, decreased energy 2 3 2 2 2   Change in appetite 1 0 0 0 1  Feeling bad or failure about yourself  0 0 1 0 0  Trouble concentrating 0 0 0 0 1  Moving slowly or fidgety/restless 0 0 0 0 0  Suicidal thoughts 0 0 0 0 0  PHQ-9 Score 9 9 9 6 6   Difficult doing work/chores    Not difficult at all       09/27/2021    9:59 AM 09/21/2021    4:54 PM 08/30/2021   10:36 AM 08/18/2021    2:02 PM  GAD 7 : Generalized Anxiety Score  Nervous, Anxious, on Edge 3 1 2 1   Control/stop worrying 2 1 1 1   Worry too much - different things 2 1 1 1   Trouble relaxing 2 1 1  0  Restless 0 0 0 0  Easily annoyed or irritable 3 3 2 2   Afraid - awful might happen 0 1 0 0  Total GAD 7 Score 12 8 7 5   Anxiety Difficulty Not difficult at all  Not difficult at all

## 2023-08-29 ENCOUNTER — Ambulatory Visit (INDEPENDENT_AMBULATORY_CARE_PROVIDER_SITE_OTHER): Payer: Medicaid Other | Admitting: Clinical

## 2023-08-29 DIAGNOSIS — F419 Anxiety disorder, unspecified: Secondary | ICD-10-CM | POA: Diagnosis not present

## 2023-08-29 NOTE — BH Specialist Note (Deleted)
 Integrated Behavioral Health via Telemedicine Visit  08/29/2023 JOELL BUERGER [id number]  Number of Integrated Behavioral Health Clinician visits: 1- Initial Visit  Session Start time: [phone]   Session End time: 1010  Total time in minutes: 24   Referring Provider: *** Patient/Family location: [facility] Provider location: *** All persons participating in visit: *** Types of Service: {CHL AMB TYPE OF SERVICE:[phone]}  I connected with Eula Fried and/or Talyssa L Krueger's {family members:20773} via  Telephone or Engineer, civil (consulting)  (Video is Surveyor, mining) and verified that I am speaking with the correct person using two identifiers. Discussed confidentiality: {YES/NO:21197}  I discussed the limitations of telemedicine and the availability of in person appointments.  Discussed there is a possibility of technology failure and discussed alternative modes of communication if that failure occurs.  I discussed that engaging in this telemedicine visit, they consent to the provision of behavioral healthcare and the services will be billed under their insurance.  Patient and/or legal guardian expressed understanding and consented to Telemedicine visit: {YES/NO:21197}  Presenting Concerns: Patient and/or family reports the following symptoms/concerns: *** Duration of problem: ***; Severity of problem: {Mild/Moderate/Severe:20260}  Patient and/or Family's Strengths/Protective Factors: {CHL AMB BH PROTECTIVE [phone]}  Goals Addressed: Patient will:  Reduce symptoms of: {IBH Symptoms:21014056}   Increase knowledge and/or ability of: {IBH Patient Tools:21014057}   Demonstrate ability to: {IBH Goals:21014053}  Progress towards Goals: {CHL AMB BH PROGRESS TOWARDS [phone]}  Interventions: Interventions utilized:  {IBH Interventions:21014054} Standardized Assessments completed: {IBH Screening Tools:21014051}  Patient and/or Family  Response: ***  Assessment: Patient currently experiencing ***.   Patient may benefit from ***.  Plan: Follow up with behavioral health clinician on : *** Behavioral recommendations: *** Referral(s): {IBH Referrals:21014055}  I discussed the assessment and treatment plan with the patient and/or parent/guardian. They were provided an opportunity to ask questions and all were answered. They agreed with the plan and demonstrated an understanding of the instructions.   They were advised to call back or seek an in-person evaluation if the symptoms worsen or if the condition fails to improve as anticipated.  Valetta Close Eria Lozoya, LCSW

## 2023-08-29 NOTE — Patient Instructions (Signed)
[facility] [address] [place], [place] [address] [phone] (main office) [phone] ([name]'s office)     BRAINSTORMING  Develop a Plan Goals: Provide a way to start conversation about your new life with a baby Assist parents in recognizing and using resources within their reach Help pave the way before birth for an easier period of transition afterwards.  Make a list of the following information to keep in a central location: Full name of Mom and Partner: _____________________________________________ Baby's full name and Date of Birth: ___________________________________________ Home Address: ___________________________________________________________ ________________________________________________________________________ Home Phone: ____________________________________________________________ Parents' cell numbers: _____________________________________________________ ________________________________________________________________________ Name and contact info for OB: ______________________________________________ Name and contact info for Pediatrician:________________________________________ Contact info for Lactation Consultants: ________________________________________  REST and SLEEP *You each need at least 4-5 hours of uninterrupted sleep every day. Write specific names and contact information.* How are you going to rest in the postpartum period? While partner's home? When partner returns to work? When you both return to work? Where will your baby sleep? Who is available to help during the day? Evening? Night? Who could move in for a period to help support you? What are some ideas to help you get enough  sleep? __________________________________________________________________________________________________________________________________________________________________________________________________________________________________________ NUTRITIOUS FOOD AND DRINK *Plan for meals before your baby is born so you can have healthy food to eat during the immediate postpartum period.* Who will look after breakfast? Lunch? Dinner? List names and contact information. Brainstorm quick, healthy ideas for each meal. What can you do before baby is born to prepare meals for the postpartum period? How can others help you with meals? Which grocery stores provide online shopping and delivery? Which restaurants offer take-out or delivery options? ______________________________________________________________________________________________________________________________________________________________________________________________________________________________________________________________________________________________________________________________________________________________________________________________________  CARE FOR MOM *It's important that mom is cared for and pampered in the postpartum period. Remember, the most important ways new mothers need care are: sleep, nutrition, gentle exercise, and time off.* Who can come take care of mom during this period? Make a list of people with their contact information. List some activities that make you feel cared for, rested, and energized? Who can make sure you have opportunities to do these things? Does mom have a space of her very own within your home that's just for her? Make a "Mama Cave" where she can be comfortable, rest, and renew herself  daily. ______________________________________________________________________________________________________________________________________________________________________________________________________________________________________________________________________________________________________________________________________________________________________________________________________    CARE FOR AND FEEDING BABY *Knowledgeable and encouraging people will offer the [place] support with regard to feeding your baby.* Educate yourself and choose the [place] feeding option for your baby. Make a list of people who will guide, support, and be a resource for you as your care for and feed your baby. (Friends that have breastfed or are currently breastfeeding, lactation consultants, breastfeeding support groups, etc.) Consider a postpartum doula. (These websites can give you information: dona.org & padanc.org) Seek out local breastfeeding resources like the breastfeeding support group at [facility] or [employer] Leche [employer]. ______________________________________________________________________________________________________________________________________________________________________________________________________________________________________________________________________________________________________________________________________________________________________________________________________  CHORES AND ERRANDS Who can help with a thorough cleaning before baby is born? Make a list of people who will help with housekeeping and chores, like laundry, light cleaning, dishes, bathrooms, etc. Who can run some errands for you? What can you do to make sure you are stocked with basic supplies before baby is born? Who is going to do the  shopping? ______________________________________________________________________________________________________________________________________________________________________________________________________________________________________________________________________________________________________________________________________________________________________________________________________     Family Adjustment *Nurture yourselves.it helps parents be more loving and allows for better bonding with their child.* What sorts of things do you and partner enjoy   doing together? Which activities help you to connect and strengthen your relationship? Make a list of those things. Make a list of people whom you trust to care for your baby so you can have some time together as a couple. What types of things help partner feel connected to Mom? Make a list. What needs will partner have in order to bond with baby? Other children? Who will care for them when you go into labor and while you are in the hospital? Think about what the needs of your older children might be. Who can help you meet those needs? In what ways are you helping them prepare for bringing baby home? List some specific strategies you have for family adjustment. _______________________________________________________________________________________________________________________________________________________________________________________________________________________________________________________________________________________________________________________________________________  SUPPORT *Someone who can empathize with experiences normalizes your problems and makes them more bearable.* Make a list of other friends, neighbors, and/or co-workers you know with infants (and small children, if applicable) with whom you can connect. Make a list of local or online support groups, mom groups, etc. in which you can be  involved. ______________________________________________________________________________________________________________________________________________________________________________________________________________________________________________________________________________________________________________________________________________________________________________________________________  Childcare Plans Investigate and plan for childcare if mom is returning to work. Talk about mom's concerns about her transition back to work. Talk about partner's concerns regarding this transition.  Mental Health *Your mental health is one of the highest priorities for a pregnant or postpartum mom.* 1 in 5 women experience anxiety and/or depression from the time of conception through the first year after birth. Postpartum Mood Disorders are the #1 complication of pregnancy and childbirth and the suffering experienced by these mothers is not necessary! These illnesses are temporary and respond well to treatment, which often includes self-care, social support, talk therapy, and medication when needed. Women experiencing anxiety and depression often say things like: "I'm supposed to be happy.why do I feel so sad?", "Why can't I snap out of it?", "I'm having thoughts that scare me." There is no need to be embarrassed if you are feeling these symptoms: Overwhelmed, anxious, angry, sad, guilty, irritable, hopeless, exhausted but can't sleep You are NOT alone. You are NOT to blame. With help, you WILL be well. Where can I find help? Medical professionals such as your OB, midwife, gynecologist, family practitioner, primary care provider, pediatrician, or mental health providers; [facility] support groups: Feelings After Birth, Breastfeeding Support Group, Baby and Me Group, and Fit 4 Two exercise classes. You have permission to ask for help. It will confirm your feelings, validate your experiences,  share/learn coping strategies, and gain support and encouragement as you heal. You are important! BRAINSTORM Make a list of local resources, including resources for mom and for partner. Identify support groups. Identify people to call late at night - include names and contact info. Talk with partner about perinatal mood and anxiety disorders. Talk with your OB, midwife, and doula about baby blues and about perinatal mood and anxiety disorders. Talk with your pediatrician about perinatal mood and anxiety disorders.   Support & Sanity Savers   What do you really need?  Basics In preparing for a new baby, many expectant parents spend hours shopping for baby clothes, decorating the nursery, and deciding which car seat to buy. Yet most don't think much about what the reality of parenting a newborn will be like, and what they need to make it through that. So, here is the advice of experienced parents. We know you'll read this, and think "they're exaggerating, I don't really need that." Just trust us on these, OK? Plan for all of   this, and if it turns out you don't need it, come back and teach us how you did it!  Must-Haves (Once baby's survival needs are met, make sure you attend to your own survival needs!) Sleep An average newborn sleeps 16-18 hours per day, over 6-7 sleep periods, rarely more than three hours at a time. It is normal and healthy for a newborn to wake throughout the night... but really hard on parents!! Naps. Prioritize sleep above any responsibilities like: cleaning house, visiting friends, running errands, etc.  Sleep whenever baby sleeps. If you can't nap, at least have restful times when baby eats. The more rest you get, the more patient you will be, the more emotionally stable, and better at solving problems.  Food You may not have realized it would be difficult to eat when you have a newborn. Yet, when we talk to countless new parents, they say things like "it may be 2:00 pm  when I realize I haven't had breakfast yet." Or "every time we sit down to dinner, baby needs to eat, and my food gets cold, so I don't bother to eat it." Finger food. Before your baby is born, stock up with one months' worth of food that: 1) you can eat with one hand while holding a baby, 2) doesn't need to be prepped, 3) is good hot or cold, 4) doesn't spoil when left out for a few hours, and 5) you like to eat. Think about: nuts, dried fruit, Clif bars, pretzels, jerky, gogurt, baby carrots, apples, bananas, crackers, cheez-n-crackers, string cheese, hot pockets or frozen burritos to microwave, garden burgers and breakfast pastries to put in the toaster, yogurt drinks, etc. Restaurant Menus. Make lists of your favorite restaurants & menu items. When family/friends want to help, you can give specific information without much thought. They can either bring you the food or send gift cards for just the right meals. Freezer Meals.  Take some time to make a few meals to put in the freezer ahead of time.  Easy to freeze meals can be anything such as soup, lasagna, chicken pie, or spaghetti sauce. Set up a Meal Schedule.  Ask friends and family to sign up to bring you meals during the first few weeks of being home. (It can be passed around at baby showers!) You have no idea how helpful this will be until you are in the throes of parenting.  www.takethemameal.com is a great website to check out. Emotional Support Know who to call when you're stressed out. Parenting a newborn is very challenging work. There are times when it totally overwhelms your normal coping abilities. EVERY NEW PARENT NEEDS TO HAVE A PLAN FOR WHO TO CALL WHEN THEY JUST CAN'T COPE ANY MORE. (And it has to be someone other than the baby's other parent!) Before your baby is born, come up with at least one person you can call for support - write their phone number down and post it on the refrigerator. Anxiety & Sadness. Baby blues are normal after  pregnancy; however, there are more severe types of anxiety & sadness which can occur and should not be ignored.  They are always treatable, but you have to take the first step by reaching out for help. [facility] offers a "Mom Talk" group which meets every Tuesday from 10 am - 11 am.  This group is for new moms who need support and connection after their babies are born.  Call [phone].  Really, Really Helpful (Plan for them!   Make sure these happen often!!) Physical Support with Taking Care of Yourselves Asking friends and family. Before your baby is born, set up a schedule of people who can come and visit and help out (or ask a friend to schedule for you). Any time someone says "let me know what I can do to help," sign them up for a day. When they get there, their job is not to take care of the baby (that's your job and your joy). Their job is to take care of you!  Postpartum doulas. If you don't have anyone you can call on for support, look into postpartum doulas:  professionals at helping parents with caring for baby, caring for themselves, getting breastfeeding started, and helping with household tasks. www.padanc.org is a helpful website for learning about doulas in our area. Peer Support / Parent Groups Why: One of the greatest ideas for new parents is to be around other new parents. Parent groups give you a chance to share and listen to others who are going through the same season of life, get a sense of what is normal infant development by watching several babies learn and grow, share your stories of triumph and struggles with empathetic ears, and forgive your own mistakes when you realize all parents are learning by trial and error. Where to find: There are many places you can meet other new parents throughout our community.  [facility] offers the following classes for new moms and their little ones:  Baby and Me (Birth to Crawling) and Breastfeeding Support Group. Go to  www.conehealthybaby.com or call [phone] for more information. Time for your Relationship It's easy to get so caught up in meeting baby's immediate needs that it's hard to find time to connect with your partner, and meet the needs of your relationship. It's also easy to forget what "quality time with your partner" actually looks like. If you take your baby on a date, you'd be amazed how much of your couple time is spent feeding the baby, diapering the baby, admiring the baby, and talking about the baby. Dating: Try to take time for just the two of you. Babysitter tip: Sometimes when moms are breastfeeding a newborn, they find it hard to figure out how to schedule outings around baby's unpredictable feeding schedules. Have the babysitter come for a three hour period. When she comes over, if baby has just eaten, you can leave right away, and come back in two hours. If baby hasn't fed recently, you start the date at home. Once baby gets hungry and gets a good feeding in, you can head out for the rest of your date time. Date Nights at Home: If you can't get out, at least set aside one evening a week to prioritize your relationship: whenever baby dozes off or doesn't have any immediate needs, spend a little time focusing on each other. Potential conflicts: The main relationship conflicts that come up for new parents are: issues related to sexuality, financial stresses, a feeling of an unfair division of household tasks, and conflicts in parenting styles. The more you can work on these issues before baby arrives, the better!  Fun and Frills (Don't forget these. and don't feel guilty for indulging in them!) Everyone has something in life that is a fun little treat that they do just for themselves. It may be: reading the morning paper, or going for a daily jog, or having coffee with a friend once a week, or going to a movie on Friday nights,   or fine chocolates, or bubble baths, or curling up with a good  book. Unless you do fun things for yourself every now and then, it's hard to have the energy for fun with your baby. Whatever your "special" treats are, make sure you find a way to continue to indulge in them after your baby is born. These special moments can recharge you, and allow you to return to baby with a new joy   PERINATAL MOOD DISORDERS: MATERNAL MENTAL HEALTH FROM CONCEPTION THROUGH THE POSTPARTUM PERIOD   _________________________________________Emergency and Crisis Resources If you are an imminent risk to self or others, are experiencing intense personal distress, and/or have noticed significant changes in activities of daily living, call:  911 [facility]: [phone]  [address], [place], [place], [address] Mobile Crisis: 877-626-1772 National Suicide Hotline: 988 Or visit the following crisis centers: Local Emergency Departments [facility]: [address], [place]  [phone]. Hours: 8:30AM-5PM. Insurance Accepted: Medicaid, Medicare, and Uninsured.  [facility]:  [address], [place]  Mon-Friday 8am-3pm, [phone]                                                                                  ___________ Non-Crisis Resources To identify specific providers that are covered by your insurance, contact your insurance company or local agencies:  [facility]: 1-800-256-2452 [facility]: 888-581-9988 [facility]: 1-800-939-5911 Postpartum Support International- Warm-line: 1-800-944-4773                                                      __Outpatient Therapy and Medication Management   Providers:  [facility]: [phone] Hours: 9AM-5PM  Insurance Accepted: AARP, Aetna, BCBS, Cigna, [employer], Humana, Medicare  [facility] [phone] Hours: 8AM-5:30PM  nsurance Accepted: All insurances EXCEPT AARP, Aetna,  [employer], and Humana [facility]: [phone] Hours: 8AM-8PM Insurance Accepted: Aetna, BCBS, Cigna, [employer], Medicaid, Medicare, Uninsured [facility]: [phone] [facility]: [phone] Hours: 8:30AM-7PM Insurance Accepted: Aetna, BCBS, Medicaid, Medicare, Tricare, United Healthcare [facility]:  [phone]   Hours:9AM-5PM Insurance Accepted:  Aetna, BCBS, [facility], Medicaid, United Health Care  [facility]: [phone] Hours: 9AM-5:30PM Insurance Accepted: AARP, Aetna, BCBS, Cigna, and Medicaid, Medicare, United Health Care [facility]:  [phone] Hours: 9am-5pm Insurance Accepted: BCBS; they do not accept Medicaid/Medicare [facility]: [phone] Hours: 9am-9pm Insurance Accepted: All major insurance including Medicaid and Medicare [facility]: [phone] Hours: 9AM- 5PM Insurance Accepted: All insurances EXCEPT Medicaid and Medicare. [facility]: [phone]   ____________                                                                       Parenting Support Groups [facility]: [phone] [facility]:  [phone] Family Support Network: (support for children in the NICU and/or with special needs), [phone]   ___________                                                                 Mental Health Support Groups Mental Health Association: [phone]    _____________                                                                                  Online Resources Postpartum Support International: http://www.postpartum.net/  800-944-4PPD 2Moms Supporting Moms:  www.momssupportingmoms.net    

## 2023-09-07 ENCOUNTER — Encounter: Payer: Self-pay | Admitting: *Deleted

## 2023-09-14 ENCOUNTER — Encounter: Payer: Medicaid Other | Admitting: Obstetrics and Gynecology

## 2023-09-14 ENCOUNTER — Other Ambulatory Visit: Payer: Self-pay

## 2023-09-14 ENCOUNTER — Other Ambulatory Visit: Payer: Self-pay | Admitting: *Deleted

## 2023-09-14 ENCOUNTER — Ambulatory Visit (INDEPENDENT_AMBULATORY_CARE_PROVIDER_SITE_OTHER): Payer: Medicaid Other | Admitting: Family Medicine

## 2023-09-14 ENCOUNTER — Ambulatory Visit: Payer: Medicaid Other | Attending: Maternal & Fetal Medicine

## 2023-09-14 VITALS — BP 112/74 | HR 88 | Wt 258.5 lb

## 2023-09-14 DIAGNOSIS — O99212 Obesity complicating pregnancy, second trimester: Secondary | ICD-10-CM | POA: Diagnosis not present

## 2023-09-14 DIAGNOSIS — O09522 Supervision of elderly multigravida, second trimester: Secondary | ICD-10-CM

## 2023-09-14 DIAGNOSIS — O3412 Maternal care for benign tumor of corpus uteri, second trimester: Secondary | ICD-10-CM

## 2023-09-14 DIAGNOSIS — O09292 Supervision of pregnancy with other poor reproductive or obstetric history, second trimester: Secondary | ICD-10-CM | POA: Diagnosis not present

## 2023-09-14 DIAGNOSIS — O23592 Infection of other part of genital tract in pregnancy, second trimester: Secondary | ICD-10-CM | POA: Diagnosis not present

## 2023-09-14 DIAGNOSIS — Z9889 Other specified postprocedural states: Secondary | ICD-10-CM

## 2023-09-14 DIAGNOSIS — O0992 Supervision of high risk pregnancy, unspecified, second trimester: Secondary | ICD-10-CM | POA: Diagnosis not present

## 2023-09-14 DIAGNOSIS — O99012 Anemia complicating pregnancy, second trimester: Secondary | ICD-10-CM

## 2023-09-14 DIAGNOSIS — O402XX0 Polyhydramnios, second trimester, not applicable or unspecified: Secondary | ICD-10-CM | POA: Diagnosis not present

## 2023-09-14 DIAGNOSIS — Z8632 Personal history of gestational diabetes: Secondary | ICD-10-CM

## 2023-09-14 DIAGNOSIS — O0990 Supervision of high risk pregnancy, unspecified, unspecified trimester: Secondary | ICD-10-CM

## 2023-09-14 DIAGNOSIS — D259 Leiomyoma of uterus, unspecified: Secondary | ICD-10-CM

## 2023-09-14 DIAGNOSIS — O09299 Supervision of pregnancy with other poor reproductive or obstetric history, unspecified trimester: Secondary | ICD-10-CM

## 2023-09-14 DIAGNOSIS — Z362 Encounter for other antenatal screening follow-up: Secondary | ICD-10-CM | POA: Insufficient documentation

## 2023-09-14 DIAGNOSIS — Z3A24 24 weeks gestation of pregnancy: Secondary | ICD-10-CM

## 2023-09-14 DIAGNOSIS — O99512 Diseases of the respiratory system complicating pregnancy, second trimester: Secondary | ICD-10-CM

## 2023-09-14 DIAGNOSIS — D563 Thalassemia minor: Secondary | ICD-10-CM

## 2023-09-14 DIAGNOSIS — A5901 Trichomonal vulvovaginitis: Secondary | ICD-10-CM

## 2023-09-14 DIAGNOSIS — J45909 Unspecified asthma, uncomplicated: Secondary | ICD-10-CM

## 2023-09-14 DIAGNOSIS — E669 Obesity, unspecified: Secondary | ICD-10-CM

## 2023-09-14 DIAGNOSIS — O09899 Supervision of other high risk pregnancies, unspecified trimester: Secondary | ICD-10-CM

## 2023-09-14 NOTE — Progress Notes (Signed)
   PRENATAL VISIT NOTE  Subjective:  Sandra Krueger is a 38 y.o. [phone] at [redacted]w[redacted]d being seen today for ongoing prenatal care.  She is currently monitored for the following issues for this high-risk pregnancy and has BMI 40.0-44.9, adult (HCC); Low TSH level; Alpha thalassemia silent carrier; History of primary cesarean section; Supervision of high risk pregnancy, antepartum; AMA (advanced maternal age) multigravida 35+; History of gestational diabetes mellitus (GDM) in prior pregnancy, currently pregnant; Asthma; Short interval between pregnancies affecting pregnancy, antepartum; Fibroids; History of myomectomy; and Trichomonal vaginitis during pregnancy in second trimester on their problem list.  Patient reports no bleeding, no cramping, no leaking, and Braxton-Hicks and L>R sciatica .  Contractions: Irritability. Vag. Bleeding: None.  Movement: Present. Denies leaking of fluid.   The following portions of the patient's history were reviewed and updated as appropriate: allergies, current medications, past family history, past medical history, past social history, past surgical history and problem list.   Objective:   Vitals:   09/14/23 1109  BP: 112/74  Pulse: 88  Weight: 258 lb 8 oz (117.3 kg)    Fetal Status: Fetal Heart Rate (bpm): 150   Movement: Present     General:  Alert, oriented and cooperative. Patient is in no acute distress.  Skin: Skin is warm and dry. No rash noted.   Cardiovascular: Normal heart rate noted  Respiratory: Normal respiratory effort, no problems with respiration noted  Abdomen: Soft, gravid, appropriate for gestational age.  Pain/Pressure: Present     Pelvic: Cervical exam deferred        Extremities: Normal range of motion.  Edema: Trace (feet)  Mental Status: Normal mood and affect. Normal behavior. Normal judgment and thought content.   Assessment and Plan:  Pregnancy: [id number] at [redacted]w[redacted]d 1. Trichomonal vaginitis during pregnancy in second trimester  (Primary) - patient and partner plan to pick up medication next week to complete treatment - plan for TOC at 28 weeks  2. Supervision of high risk pregnancy, antepartum - counseled on importance of restarting aspirin before timeframe of evidence-based benefit - follow up in 4 weeks  3. [redacted] weeks gestation of pregnancy  4. History of myomectomy - myomectomy done between 1st and 2nd pregnancy with vaginal delivery after myomectomy, has not been told that she could not have a vaginal delivery afterwards - discuss TOLAC at follow up visit  5. History of gestational diabetes mellitus (GDM) in prior pregnancy, currently pregnant - GTT at 28 week appointment  Preterm labor symptoms and general obstetric precautions including but not limited to vaginal bleeding, contractions, leaking of fluid and fetal movement were reviewed in detail with the patient. Please refer to After Visit Summary for other counseling recommendations.   No follow-ups on file.  No future appointments.  Estrella Deeds, Medical Student

## 2023-09-20 ENCOUNTER — Telehealth: Payer: Self-pay | Admitting: Clinical

## 2023-09-20 NOTE — Telephone Encounter (Signed)
Attempt to reschedule cancelled appointment; Left HIPPA-compliant message to call back Asher Muir from [employer] for [employer] at [facility] at  [phone] [facility] office).

## 2023-10-10 ENCOUNTER — Inpatient Hospital Stay (HOSPITAL_BASED_OUTPATIENT_CLINIC_OR_DEPARTMENT_OTHER): Payer: Medicaid Other

## 2023-10-10 ENCOUNTER — Inpatient Hospital Stay (HOSPITAL_COMMUNITY)
Admission: AD | Admit: 2023-10-10 | Discharge: 2023-10-10 | Disposition: A | Discharge status: Home / Self Care | Payer: Medicaid Other | Attending: Obstetrics & Gynecology | Admitting: Obstetrics & Gynecology

## 2023-10-10 ENCOUNTER — Encounter (HOSPITAL_COMMUNITY): Payer: Self-pay | Admitting: Obstetrics & Gynecology

## 2023-10-10 DIAGNOSIS — O09899 Supervision of other high risk pregnancies, unspecified trimester: Secondary | ICD-10-CM

## 2023-10-10 DIAGNOSIS — O0990 Supervision of high risk pregnancy, unspecified, unspecified trimester: Secondary | ICD-10-CM | POA: Diagnosis not present

## 2023-10-10 DIAGNOSIS — O3412 Maternal care for benign tumor of corpus uteri, second trimester: Secondary | ICD-10-CM | POA: Insufficient documentation

## 2023-10-10 DIAGNOSIS — Z148 Genetic carrier of other disease: Secondary | ICD-10-CM | POA: Diagnosis not present

## 2023-10-10 DIAGNOSIS — O09299 Supervision of pregnancy with other poor reproductive or obstetric history, unspecified trimester: Secondary | ICD-10-CM

## 2023-10-10 DIAGNOSIS — O09522 Supervision of elderly multigravida, second trimester: Secondary | ICD-10-CM | POA: Insufficient documentation

## 2023-10-10 DIAGNOSIS — O09292 Supervision of pregnancy with other poor reproductive or obstetric history, second trimester: Secondary | ICD-10-CM | POA: Diagnosis not present

## 2023-10-10 DIAGNOSIS — Z3A27 27 weeks gestation of pregnancy: Secondary | ICD-10-CM | POA: Insufficient documentation

## 2023-10-10 DIAGNOSIS — J45909 Unspecified asthma, uncomplicated: Secondary | ICD-10-CM | POA: Diagnosis not present

## 2023-10-10 DIAGNOSIS — Z3492 Encounter for supervision of normal pregnancy, unspecified, second trimester: Secondary | ICD-10-CM

## 2023-10-10 DIAGNOSIS — O9A212 Injury, poisoning and certain other consequences of external causes complicating pregnancy, second trimester: Secondary | ICD-10-CM

## 2023-10-10 DIAGNOSIS — O99512 Diseases of the respiratory system complicating pregnancy, second trimester: Secondary | ICD-10-CM | POA: Insufficient documentation

## 2023-10-10 DIAGNOSIS — O3429 Maternal care due to uterine scar from other previous surgery: Secondary | ICD-10-CM | POA: Insufficient documentation

## 2023-10-10 DIAGNOSIS — O43102 Malformation of placenta, unspecified, second trimester: Secondary | ICD-10-CM | POA: Insufficient documentation

## 2023-10-10 DIAGNOSIS — D219 Benign neoplasm of connective and other soft tissue, unspecified: Secondary | ICD-10-CM

## 2023-10-10 DIAGNOSIS — O99212 Obesity complicating pregnancy, second trimester: Secondary | ICD-10-CM | POA: Diagnosis present

## 2023-10-10 DIAGNOSIS — O24112 Pre-existing diabetes mellitus, type 2, in pregnancy, second trimester: Secondary | ICD-10-CM | POA: Insufficient documentation

## 2023-10-10 DIAGNOSIS — M7918 Myalgia, other site: Secondary | ICD-10-CM

## 2023-10-10 DIAGNOSIS — D259 Leiomyoma of uterus, unspecified: Secondary | ICD-10-CM | POA: Diagnosis not present

## 2023-10-10 LAB — URINALYSIS, ROUTINE W REFLEX MICROSCOPIC
Bilirubin Urine: NEGATIVE
Glucose, UA: NEGATIVE mg/dL
Hgb urine dipstick: NEGATIVE
Ketones, ur: 20 mg/dL — AB
Leukocytes,Ua: NEGATIVE
Nitrite: NEGATIVE
Protein, ur: NEGATIVE mg/dL
Specific Gravity, Urine: 1.012 (ref 1.005–1.030)
pH: 6 (ref 5.0–8.0)

## 2023-10-10 MED ORDER — ACETAMINOPHEN 500 MG PO TABS
1000.0000 mg | ORAL_TABLET | Freq: Once | ORAL | Status: AC
  Administered 2023-10-10: 1000 mg via ORAL
  Filled 2023-10-10: qty 2

## 2023-10-10 MED ORDER — CYCLOBENZAPRINE HCL 5 MG PO TABS
10.0000 mg | ORAL_TABLET | Freq: Once | ORAL | Status: AC
  Administered 2023-10-10: 10 mg via ORAL
  Filled 2023-10-10: qty 2

## 2023-10-10 MED ORDER — CYCLOBENZAPRINE HCL 10 MG PO TABS: 10.0000 mg | ORAL_TABLET | Freq: Three times a day (TID) | ORAL | 2 refills | Status: DC | PRN
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
## 2023-10-12 ENCOUNTER — Other Ambulatory Visit: Payer: Self-pay

## 2023-10-12 ENCOUNTER — Other Ambulatory Visit (HOSPITAL_COMMUNITY)
Admission: RE | Admit: 2023-10-12 | Discharge: 2023-10-12 | Disposition: A | Discharge status: Home / Self Care | Payer: Medicaid Other | Source: Ambulatory Visit | Attending: Obstetrics and Gynecology | Admitting: Obstetrics and Gynecology

## 2023-10-12 ENCOUNTER — Ambulatory Visit: Payer: Medicaid Other | Admitting: Obstetrics and Gynecology

## 2023-10-12 VITALS — BP 105/71 | HR 96 | Wt 258.0 lb

## 2023-10-12 DIAGNOSIS — O09523 Supervision of elderly multigravida, third trimester: Secondary | ICD-10-CM

## 2023-10-12 DIAGNOSIS — O09299 Supervision of pregnancy with other poor reproductive or obstetric history, unspecified trimester: Secondary | ICD-10-CM

## 2023-10-12 DIAGNOSIS — O0993 Supervision of high risk pregnancy, unspecified, third trimester: Secondary | ICD-10-CM | POA: Diagnosis not present

## 2023-10-12 DIAGNOSIS — Z6841 Body Mass Index (BMI) 40.0 and over, adult: Secondary | ICD-10-CM

## 2023-10-12 DIAGNOSIS — A5901 Trichomonal vulvovaginitis: Secondary | ICD-10-CM

## 2023-10-12 DIAGNOSIS — Z113 Encounter for screening for infections with a predominantly sexual mode of transmission: Secondary | ICD-10-CM | POA: Insufficient documentation

## 2023-10-12 DIAGNOSIS — O23593 Infection of other part of genital tract in pregnancy, third trimester: Secondary | ICD-10-CM

## 2023-10-12 DIAGNOSIS — Z9889 Other specified postprocedural states: Secondary | ICD-10-CM

## 2023-10-12 DIAGNOSIS — D563 Thalassemia minor: Secondary | ICD-10-CM

## 2023-10-12 DIAGNOSIS — O09293 Supervision of pregnancy with other poor reproductive or obstetric history, third trimester: Secondary | ICD-10-CM

## 2023-10-12 DIAGNOSIS — O0990 Supervision of high risk pregnancy, unspecified, unspecified trimester: Secondary | ICD-10-CM

## 2023-10-12 DIAGNOSIS — O09899 Supervision of other high risk pregnancies, unspecified trimester: Secondary | ICD-10-CM

## 2023-10-12 DIAGNOSIS — Z3A28 28 weeks gestation of pregnancy: Secondary | ICD-10-CM | POA: Diagnosis not present

## 2023-10-12 DIAGNOSIS — O09893 Supervision of other high risk pregnancies, third trimester: Secondary | ICD-10-CM

## 2023-10-12 DIAGNOSIS — Z8632 Personal history of gestational diabetes: Secondary | ICD-10-CM

## 2023-10-12 DIAGNOSIS — Z98891 History of uterine scar from previous surgery: Secondary | ICD-10-CM

## 2023-10-12 NOTE — Progress Notes (Signed)
   PRENATAL VISIT NOTE  Subjective:  Sandra Krueger is a 38 y.o. [phone] at [redacted]w[redacted]d being seen today for ongoing prenatal care.  She is currently monitored for the following issues for this high-risk pregnancy and has BMI 40.0-44.9, adult (HCC); Low TSH level; Alpha thalassemia silent carrier; History of primary cesarean section; Supervision of high risk pregnancy, antepartum; AMA (advanced maternal age) multigravida 35+; History of gestational diabetes mellitus (GDM) in prior pregnancy, currently pregnant; Asthma; Short interval between pregnancies affecting pregnancy, antepartum; Fibroids; History of myomectomy; and Trichomonal vaginitis during pregnancy in second trimester on their problem list.  Patient doing well with no acute concerns today. She reports no complaints.  Contractions: Not present. Vag. Bleeding: None.  Movement: Present. Denies leaking of fluid.   The following portions of the patient's history were reviewed and updated as appropriate: allergies, current medications, past family history, past medical history, past social history, past surgical history and problem list. Problem list updated.  Objective:   Vitals:   10/12/23 1124  BP: 105/71  Pulse: 96  Weight: 258 lb (117 kg)    Fetal Status: Fetal Heart Rate (bpm): 142   Movement: Present     General:  Alert, oriented and cooperative. Patient is in no acute distress.  Skin: Skin is warm and dry. No rash noted.   Cardiovascular: Normal heart rate noted  Respiratory: Normal respiratory effort, no problems with respiration noted  Abdomen: Soft, gravid, appropriate for gestational age.  Pain/Pressure: Absent     Pelvic: Cervical exam deferred        Extremities: Normal range of motion.  Edema: Trace  Mental Status:  Normal mood and affect. Normal behavior. Normal judgment and thought content.   Assessment and Plan:  Pregnancy: G5P3013 at [redacted]w[redacted]d  1. Screen for STD (sexually transmitted disease) (Primary) Self swab taken  for TOC trich - Cervicovaginal ancillary only( [place])  2. Supervision of high risk pregnancy, antepartum Continue routine prenatal care Pt will need lab only visit for 2 hour GTT  - Culture, OB Urine - CBC; Future - Glucose Tolerance, 2 Hours w/1 Hour; Future - HIV Antibody (routine testing w rflx); Future - RPR; Future  3. [redacted] weeks gestation of pregnancy   4. Trichomonal vaginitis during pregnancy in second trimester TOC today  5. Alpha thalassemia silent carrier   6. Multigravida of advanced maternal age in third trimester   7. BMI 40.0-44.9, adult (HCC)   8. History of gestational diabetes mellitus (GDM) in prior pregnancy, currently pregnant Pt needs to schedule for 2 hour GTT  9. History of myomectomy Pt states she had 2 SVD post myomectomy done in 2015  10. History of primary cesarean section VBAC info sheet given.  Pt wishes to review pros and cons before making a decision  11. Short interval between pregnancies affecting pregnancy, antepartum   Preterm labor symptoms and general obstetric precautions including but not limited to vaginal bleeding, contractions, leaking of fluid and fetal movement were reviewed in detail with the patient.  Please refer to After Visit Summary for other counseling recommendations.   Return in about 2 weeks (around 10/26/2023) for [facility], in person.   Mariel Aloe, MD Faculty Attending Center for [facility]

## 2023-10-14 LAB — CULTURE, OB URINE

## 2023-10-14 LAB — URINE CULTURE, OB REFLEX

## 2023-10-15 ENCOUNTER — Other Ambulatory Visit: Payer: Self-pay | Admitting: *Deleted

## 2023-10-15 ENCOUNTER — Encounter: Payer: Self-pay | Admitting: Obstetrics and Gynecology

## 2023-10-15 ENCOUNTER — Ambulatory Visit: Payer: Medicaid Other

## 2023-10-15 ENCOUNTER — Ambulatory Visit: Payer: Medicaid Other | Attending: Obstetrics | Admitting: *Deleted

## 2023-10-15 VITALS — BP 115/65 | HR 90

## 2023-10-15 DIAGNOSIS — O34219 Maternal care for unspecified type scar from previous cesarean delivery: Secondary | ICD-10-CM

## 2023-10-15 DIAGNOSIS — O99212 Obesity complicating pregnancy, second trimester: Secondary | ICD-10-CM

## 2023-10-15 DIAGNOSIS — O43103 Malformation of placenta, unspecified, third trimester: Secondary | ICD-10-CM

## 2023-10-15 DIAGNOSIS — O09293 Supervision of pregnancy with other poor reproductive or obstetric history, third trimester: Secondary | ICD-10-CM | POA: Diagnosis not present

## 2023-10-15 DIAGNOSIS — O99013 Anemia complicating pregnancy, third trimester: Secondary | ICD-10-CM

## 2023-10-15 DIAGNOSIS — O09523 Supervision of elderly multigravida, third trimester: Secondary | ICD-10-CM | POA: Diagnosis not present

## 2023-10-15 DIAGNOSIS — O24113 Pre-existing diabetes mellitus, type 2, in pregnancy, third trimester: Secondary | ICD-10-CM | POA: Diagnosis not present

## 2023-10-15 DIAGNOSIS — O99213 Obesity complicating pregnancy, third trimester: Secondary | ICD-10-CM | POA: Insufficient documentation

## 2023-10-15 DIAGNOSIS — O43102 Malformation of placenta, unspecified, second trimester: Secondary | ICD-10-CM | POA: Diagnosis not present

## 2023-10-15 DIAGNOSIS — O99513 Diseases of the respiratory system complicating pregnancy, third trimester: Secondary | ICD-10-CM | POA: Insufficient documentation

## 2023-10-15 DIAGNOSIS — Z3A28 28 weeks gestation of pregnancy: Secondary | ICD-10-CM | POA: Diagnosis not present

## 2023-10-15 DIAGNOSIS — O3429 Maternal care due to uterine scar from other previous surgery: Secondary | ICD-10-CM

## 2023-10-15 DIAGNOSIS — O09299 Supervision of pregnancy with other poor reproductive or obstetric history, unspecified trimester: Secondary | ICD-10-CM

## 2023-10-15 DIAGNOSIS — J45909 Unspecified asthma, uncomplicated: Secondary | ICD-10-CM | POA: Insufficient documentation

## 2023-10-15 DIAGNOSIS — D259 Leiomyoma of uterus, unspecified: Secondary | ICD-10-CM

## 2023-10-15 DIAGNOSIS — O09893 Supervision of other high risk pregnancies, third trimester: Secondary | ICD-10-CM | POA: Insufficient documentation

## 2023-10-15 DIAGNOSIS — D219 Benign neoplasm of connective and other soft tissue, unspecified: Secondary | ICD-10-CM

## 2023-10-15 DIAGNOSIS — E669 Obesity, unspecified: Secondary | ICD-10-CM | POA: Diagnosis not present

## 2023-10-15 DIAGNOSIS — O09522 Supervision of elderly multigravida, second trimester: Secondary | ICD-10-CM

## 2023-10-15 DIAGNOSIS — O09899 Supervision of other high risk pregnancies, unspecified trimester: Secondary | ICD-10-CM

## 2023-10-15 DIAGNOSIS — O3413 Maternal care for benign tumor of corpus uteri, third trimester: Secondary | ICD-10-CM | POA: Diagnosis not present

## 2023-10-15 DIAGNOSIS — O0990 Supervision of high risk pregnancy, unspecified, unspecified trimester: Secondary | ICD-10-CM

## 2023-10-15 DIAGNOSIS — D573 Sickle-cell trait: Secondary | ICD-10-CM

## 2023-10-15 LAB — CERVICOVAGINAL ANCILLARY ONLY
Comment: NEGATIVE
Trichomonas: NEGATIVE

## 2023-10-18 ENCOUNTER — Other Ambulatory Visit: Payer: Medicaid Other

## 2023-10-18 ENCOUNTER — Other Ambulatory Visit: Payer: Self-pay

## 2023-10-18 DIAGNOSIS — O0990 Supervision of high risk pregnancy, unspecified, unspecified trimester: Secondary | ICD-10-CM

## 2023-10-19 LAB — CBC
Hematocrit: 34.7 % (ref 34.0–46.6)
Hemoglobin: 11.3 g/dL (ref 11.1–15.9)
MCH: 27.4 pg (ref 26.6–33.0)
MCHC: 32.6 g/dL (ref 31.5–35.7)
MCV: 84 fL (ref 79–97)
Platelets: 212 10*3/uL (ref 150–450)
RBC: 4.13 x10E6/uL (ref 3.77–5.28)
RDW: 13.7 % (ref 11.7–15.4)
WBC: 5.6 10*3/uL (ref 3.4–10.8)

## 2023-10-19 LAB — GLUCOSE TOLERANCE, 2 HOURS W/ 1HR
Glucose, 1 hour: 171 mg/dL (ref 70–179)
Glucose, 2 hour: 98 mg/dL (ref 70–152)
Glucose, Fasting: 81 mg/dL (ref 70–91)

## 2023-10-19 LAB — RPR: RPR Ser Ql: NONREACTIVE

## 2023-10-19 LAB — HIV ANTIBODY (ROUTINE TESTING W REFLEX): HIV Screen 4th Generation wRfx: NONREACTIVE

## 2023-10-28 NOTE — Progress Notes (Deleted)
   PRENATAL VISIT NOTE  Subjective:  Sandra Krueger is a 38 y.o. [phone] at [redacted]w[redacted]d being seen today for ongoing prenatal care.  She is currently monitored for the following issues for this {Blank single:19197::"high-risk","low-risk"} pregnancy and has BMI 40.0-44.9, adult (HCC); Low TSH level; Alpha thalassemia silent carrier; History of primary cesarean section; Supervision of high risk pregnancy, antepartum; AMA (advanced maternal age) multigravida 35+; History of gestational diabetes mellitus (GDM) in prior pregnancy, currently pregnant; Asthma; Short interval between pregnancies affecting pregnancy, antepartum; Fibroids; History of myomectomy; and Trichomonal vaginitis during pregnancy in second trimester on their problem list.  Patient reports {sx:14538}.   .  .   . Denies leaking of fluid.   The following portions of the patient's history were reviewed and updated as appropriate: allergies, current medications, past family history, past medical history, past social history, past surgical history and problem list.   Objective:  There were no vitals filed for this visit.  Fetal Status:           General:  Alert, oriented and cooperative. Patient is in no acute distress.  Skin: Skin is warm and dry. No rash noted.   Cardiovascular: Normal heart rate noted  Respiratory: Normal respiratory effort, no problems with respiration noted  Abdomen: Soft, gravid, appropriate for gestational age.        Pelvic: {Blank single:19197::"Cervical exam performed in the presence of a chaperone","Cervical exam deferred"}        Extremities: Normal range of motion.     Mental Status: Normal mood and affect. Normal behavior. Normal judgment and thought content.   Assessment and Plan:  Pregnancy: [id number] at [redacted]w[redacted]d 1. Supervision of high risk pregnancy, antepartum (Primary)   2. [redacted] weeks gestation of pregnancy   3. Alpha thalassemia silent carrier   4. Multigravida of advanced maternal age in third  trimester Following mfm Antenatal testing to begin 34 weeks   5. History of gestational diabetes mellitus (GDM) in prior pregnancy, currently pregnant Normal GTT current pregnancy  6. History of myomectomy Has had 2 vaginal births post myomectomy  7. History of cesarean section   {Blank single:19197::"Term","Preterm"} labor symptoms and general obstetric precautions including but not limited to vaginal bleeding, contractions, leaking of fluid and fetal movement were reviewed in detail with the patient. Please refer to After Visit Summary for other counseling recommendations.   No follow-ups on file.  Future Appointments  Date Time Provider Department Center  10/29/2023 10:15 AM Sue Lush, FNP [facility]  11/13/2023  9:30 AM [facility] US5 [facility]  11/27/2023  8:30 AM [facility] US4 [facility]  12/03/2023  8:30 AM [facility] US4 [facility]  12/11/2023  8:30 AM [facility] US4 [facility]    Albertine Grates, FNP

## 2023-10-29 ENCOUNTER — Encounter: Payer: Self-pay | Admitting: Obstetrics and Gynecology

## 2023-10-29 DIAGNOSIS — O0990 Supervision of high risk pregnancy, unspecified, unspecified trimester: Secondary | ICD-10-CM

## 2023-10-29 DIAGNOSIS — O09523 Supervision of elderly multigravida, third trimester: Secondary | ICD-10-CM

## 2023-10-29 DIAGNOSIS — Z9889 Other specified postprocedural states: Secondary | ICD-10-CM

## 2023-10-29 DIAGNOSIS — Z98891 History of uterine scar from previous surgery: Secondary | ICD-10-CM

## 2023-10-29 DIAGNOSIS — Z3A30 30 weeks gestation of pregnancy: Secondary | ICD-10-CM

## 2023-10-29 DIAGNOSIS — O09299 Supervision of pregnancy with other poor reproductive or obstetric history, unspecified trimester: Secondary | ICD-10-CM

## 2023-10-29 DIAGNOSIS — D563 Thalassemia minor: Secondary | ICD-10-CM

## 2023-11-05 ENCOUNTER — Encounter: Admitting: Advanced Practice Midwife

## 2023-11-13 ENCOUNTER — Ambulatory Visit: Attending: Obstetrics and Gynecology | Admitting: Obstetrics and Gynecology

## 2023-11-13 ENCOUNTER — Other Ambulatory Visit: Payer: Self-pay

## 2023-11-13 ENCOUNTER — Ambulatory Visit: Attending: Obstetrics and Gynecology

## 2023-11-13 DIAGNOSIS — O99213 Obesity complicating pregnancy, third trimester: Secondary | ICD-10-CM | POA: Insufficient documentation

## 2023-11-13 DIAGNOSIS — O09523 Supervision of elderly multigravida, third trimester: Secondary | ICD-10-CM | POA: Insufficient documentation

## 2023-11-13 DIAGNOSIS — O3429 Maternal care due to uterine scar from other previous surgery: Secondary | ICD-10-CM | POA: Diagnosis not present

## 2023-11-13 DIAGNOSIS — Z3A32 32 weeks gestation of pregnancy: Secondary | ICD-10-CM

## 2023-11-13 DIAGNOSIS — E669 Obesity, unspecified: Secondary | ICD-10-CM | POA: Diagnosis not present

## 2023-11-13 DIAGNOSIS — O3413 Maternal care for benign tumor of corpus uteri, third trimester: Secondary | ICD-10-CM | POA: Insufficient documentation

## 2023-11-13 DIAGNOSIS — D259 Leiomyoma of uterus, unspecified: Secondary | ICD-10-CM | POA: Insufficient documentation

## 2023-11-13 DIAGNOSIS — O34219 Maternal care for unspecified type scar from previous cesarean delivery: Secondary | ICD-10-CM

## 2023-11-13 DIAGNOSIS — E6689 Other obesity not elsewhere classified: Secondary | ICD-10-CM

## 2023-11-13 DIAGNOSIS — Z6841 Body Mass Index (BMI) 40.0 and over, adult: Secondary | ICD-10-CM

## 2023-11-13 NOTE — Progress Notes (Signed)
  Maternal-Fetal Medicine Consultation Name: Sandra Krueger MRN: [id number]   G5 P3013 at 32w 4d gestation.  Patient is here for fetal growth assessment.  She does not have gestational diabetes. Obstetric history is significant for 2 term vaginal deliveries followed by a term cesarean delivery in 2023 because of breech presentation. Past surgical history significant for laparoscopic myomectomy in 2015 at [facility], [place], [place].  Patient had 2 vaginal deliveries following myomectomy.  She is keen on VBAC.  Ultrasound Fetal growth is appropriate for gestational age.  Amniotic fluid is normal and good fetal activity seen.  Placenta is posterior and there is no evidence of previa or placenta accreta spectrum.  I reassured the patient of normal fetal growth assessment.  We discussed the benefits and risks of VBAC.  The risk of uterine scar rupture with trial of labor is about 1%.  We do not have detailed operative note of myomectomy performed in 2015 at [facility].  You are office had requested records, but operative note was not sent over.   However, patient had 2 successful vaginal deliveries following myomectomy surgery. She is very keen on vaginal delivery.  I discussed the significance of antenatal testing from [redacted] weeks gestation.  Grade 3 obesity is associated with a slight increase in stillbirth (2.5-to 3-fold) but the absolute risk is very small.  Recommendations -Weekly antenatal testing from [redacted] weeks gestation till delivery. -VBAC consent at your office.  Consultation including face-to-face (more than 50%) counseling 30 minutes.

## 2023-11-22 ENCOUNTER — Inpatient Hospital Stay (HOSPITAL_COMMUNITY)
Admission: AD | Admit: 2023-11-22 | Discharge: 2023-11-22 | Disposition: A | Discharge status: Home / Self Care | Attending: Obstetrics & Gynecology | Admitting: Obstetrics & Gynecology

## 2023-11-22 ENCOUNTER — Telehealth: Payer: Self-pay | Admitting: General Practice

## 2023-11-22 ENCOUNTER — Encounter (HOSPITAL_COMMUNITY): Payer: Self-pay | Admitting: Obstetrics & Gynecology

## 2023-11-22 DIAGNOSIS — O99213 Obesity complicating pregnancy, third trimester: Secondary | ICD-10-CM | POA: Diagnosis not present

## 2023-11-22 DIAGNOSIS — O99893 Other specified diseases and conditions complicating puerperium: Secondary | ICD-10-CM | POA: Diagnosis not present

## 2023-11-22 DIAGNOSIS — Z3A33 33 weeks gestation of pregnancy: Secondary | ICD-10-CM | POA: Diagnosis not present

## 2023-11-22 DIAGNOSIS — E66813 Obesity, class 3: Secondary | ICD-10-CM | POA: Diagnosis not present

## 2023-11-22 DIAGNOSIS — R102 Pelvic and perineal pain: Secondary | ICD-10-CM | POA: Insufficient documentation

## 2023-11-22 DIAGNOSIS — O26899 Other specified pregnancy related conditions, unspecified trimester: Secondary | ICD-10-CM

## 2023-11-22 DIAGNOSIS — Z6841 Body Mass Index (BMI) 40.0 and over, adult: Secondary | ICD-10-CM

## 2023-11-22 LAB — URINALYSIS, ROUTINE W REFLEX MICROSCOPIC
Bilirubin Urine: NEGATIVE
Glucose, UA: NEGATIVE mg/dL
Hgb urine dipstick: NEGATIVE
Ketones, ur: 20 mg/dL — AB
Leukocytes,Ua: NEGATIVE
Nitrite: NEGATIVE
Protein, ur: NEGATIVE mg/dL
Specific Gravity, Urine: 1.008 (ref 1.005–1.030)
pH: 7 (ref 5.0–8.0)

## 2023-11-22 LAB — WET PREP, GENITAL
Clue Cells Wet Prep HPF POC: NONE SEEN
Sperm: NONE SEEN
Trich, Wet Prep: NONE SEEN
WBC, Wet Prep HPF POC: >=10 — AB (ref <10)
Yeast Wet Prep HPF POC: NONE SEEN

## 2023-11-22 MED ORDER — CYCLOBENZAPRINE HCL 10 MG PO TABS: 10.0000 mg | ORAL_TABLET | Freq: Two times a day (BID) | ORAL | 0 refills | Status: DC | PRN

## 2023-11-22 MED ORDER — ACETAMINOPHEN 325 MG PO TABS: 650.0000 mg | ORAL_TABLET | Freq: Four times a day (QID) | ORAL | 0 refills | Status: AC | PRN

## 2023-11-22 MED ORDER — CYCLOBENZAPRINE HCL 10 MG PO TABS: 10.0000 mg | ORAL_TABLET | Freq: Three times a day (TID) | ORAL | 0 refills | Status: DC | PRN

## 2023-11-22 MED ORDER — CYCLOBENZAPRINE HCL 5 MG PO TABS
10.0000 mg | ORAL_TABLET | Freq: Once | ORAL | Status: AC
  Administered 2023-11-22: 10 mg via ORAL
  Filled 2023-11-22: qty 2

## 2023-11-22 NOTE — MAU Note (Signed)
 Sandra Krueger is a 38 y.o. at [redacted]w[redacted]d here in MAU reporting: ctx that have been occurring since Tuesday but they got worse last night throughout today. Pt states ctx have been happening all day today but she doesn't know how far apart they are. Pt states she has had  pelvic pressure all day that is worse when she stands up and walks. Pt states she has noticed that baby has dropped since her baby shower the other day. Pt denies LOF or VB. +FM.   Onset of complaint: today  Pain score: 7/10 lower abdomen  7/10 pelvic pressure  Vitals:   11/22/23 2019  BP: (!) 110/58  Pulse: 89  Resp: 18  Temp: 98.5 F (36.9 C)  SpO2: 97%     FHT:156 Lab orders placed from triage:  UA

## 2023-11-22 NOTE — Telephone Encounter (Signed)
 Patient called into front office reporting her belly has dropped since the weekend, she's peeing a lot more, has pressure in her abdomen like the baby is pushing out & has been having contractions. Asked patient how often she's having contractions and what would she rate them on a pain scale. Patient states she is currently working from home so she's not really sure how often she is getting them & she also has a high pain tolerance. Reports she is adequately hydrated and drinks 8-10 large bottles of water a day. Asked patient if she has done anything to make the contractions go away and she states no. Asked patient if what she is feeling is similar to when she's in the hospital ready to have the baby or what she experiences before she goes. Patient states before she goes to the hospital type of sensations. Recommended she keep track of her contractions to know how far apart they are and ideally take two tylenol and lay down for an hour to see if that helps. Advised if contractions are 3-4 minutes apart, getting closer together or having a lot of pain, she should go to the hospital for evaluation. Patient verbalized understanding.

## 2023-11-22 NOTE — MAU Provider Note (Addendum)
 History     CSN: [id number]  Arrival date and time: 11/22/23 1941   Event Date/Time   First Provider Initiated Contact with Patient    No chief complaint on file.   HPI  Sandra Krueger is a 38 y.o. [id number] at [redacted]w[redacted]d who presents to the MAU for pelvic pressure. Patient reports ongoing issues w pelvic pressure, started on Tuesday, worse throughout today. She cannot identify how often she is having pressure/contractions. States worse when changing position (esp when she stands up from seated position). No injury/trauma. Feels that a lot of her pressure is since her "belly dropped." She has not tried anything for the pain. No VB, LOF. Has normal FM. Has known hx sciatica. States she has a maternity belt but has not been using it.   Past Medical History:  Diagnosis Date   Asthma    Complication of anesthesia    COVID-19 affecting pregnancy in first trimester 03/24/2021   Late July 2022, (see care everywhere for test)   Fibroids    Gestational diabetes    Headache    History of COVID-19 03/17/2021   During early pregnancy 02/2021        Panic attack    last episode approx two months ago per pt    Past Surgical History:  Procedure Laterality Date   CESAREAN SECTION N/A 09/29/2021   Procedure: CESAREAN SECTION;  Surgeon: Cornelius Bing, MD;  Location: [facility];  Service: Obstetrics;  Laterality: N/A;   OTHER SURGICAL HISTORY     fibroids removed   OVARY SURGERY     cyst removed from ? right ovary    Family History  Problem Relation Age of Onset   Hypertension Mother    Hypertension Father    Diabetes Maternal Aunt    Diabetes Maternal Uncle     Social History   Tobacco Use   Smoking status: Never   Smokeless tobacco: Never  Vaping Use   Vaping status: Never Used  Substance Use Topics   Alcohol use: Not Currently    Comment: wine occasionally   Drug use: Not Currently    Types: Marijuana    Comment: as a teenager    Allergies:  Allergies  Allergen Reactions    Bee Venom Anaphylaxis   Other Anaphylaxis    All nuts   Tree Extract Anaphylaxis   Gramineae Pollens Rash    Medications Prior to Admission  Medication Sig Dispense Refill Last Dose/Taking   acetaminophen (TYLENOL) 500 MG tablet Take 2 tablets (1,000 mg total) by mouth every 6 (six) hours. (Patient not taking: Reported on 08/13/2023) 30 tablet 0    albuterol (VENTOLIN HFA) 108 (90 Base) MCG/ACT inhaler Inhale 2 puffs into the lungs every 6 (six) hours as needed for wheezing or shortness of breath. (Patient not taking: Reported on 09/14/2023) 8 g 3    aspirin EC 81 MG tablet Take 1 tablet (81 mg total) by mouth daily. (Patient not taking: Reported on 09/14/2023) 90 tablet 3    Blood Pressure Monitoring (BLOOD PRESSURE KIT) DEVI 1 Device by Does not apply route as needed. (Patient not taking: Reported on 08/13/2023) 1 each 0    cyclobenzaprine (FLEXERIL) 10 MG tablet Take 1 tablet (10 mg total) by mouth 3 (three) times daily as needed for muscle spasms. 30 tablet 2    Doxylamine-Pyridoxine (DICLEGIS) 10-10 MG TBEC Take 2 tablets by mouth at bedtime. May add 1 tablet at breakfast and 1 tablet at lunch if needed. (Patient not  taking: Reported on 08/13/2023) 100 tablet 2    famotidine (PEPCID) 40 MG tablet Take 1 tablet (40 mg total) by mouth daily as needed for heartburn or indigestion. (Patient not taking: Reported on 09/14/2023) 30 tablet 2    hyoscyamine (LEVSIN/SL) 0.125 MG SL tablet Place 1 tablet (0.125 mg total) under the tongue every 6 (six) hours as needed for cramping. (Patient not taking: Reported on 08/13/2023) 10 tablet 0    Misc. Devices (GOJJI WEIGHT SCALE) MISC 1 Device by Does not apply route once a week. (Patient not taking: Reported on 08/13/2023) 1 each 0    ondansetron (ZOFRAN-ODT) 4 MG disintegrating tablet Take 1 tablet (4 mg total) by mouth every 6 (six) hours as needed for nausea. (Patient not taking: Reported on 08/13/2023) 20 tablet 2    Prenatal Vit-Fe Fumarate-FA (PRENATAL  VITAMINS PO) Take 1 tablet by mouth daily at 6 (six) AM. (Patient not taking: Reported on 08/13/2023)      promethazine (PHENERGAN) 25 MG tablet Take 1 tablet (25 mg total) by mouth every 6 (six) hours as needed for nausea or vomiting. (Patient not taking: Reported on 08/13/2023) 30 tablet 2    simethicone (MYLICON) 80 MG chewable tablet Chew 1 tablet (80 mg total) by mouth every 6 (six) hours as needed. (Patient not taking: Reported on 08/13/2023) 30 tablet 0     ROS reviewed and pertinent positives and negatives as documented in HPI.  Physical Exam   unknown if currently breastfeeding.  Physical Exam Exam conducted with a chaperone present.  Constitutional:      General: She is not in acute distress.    Appearance: Normal appearance. She is not ill-appearing.  HENT:     Head: Normocephalic and atraumatic.  Cardiovascular:     Rate and Rhythm: Normal rate.  Pulmonary:     Effort: Pulmonary effort is normal.     Breath sounds: Normal breath sounds.  Abdominal:     Palpations: Abdomen is soft.     Tenderness: There is no abdominal tenderness. There is no guarding.  Genitourinary:    Comments: Cervix visually closed on speculum exam, scant thin white discharge present in vagina Musculoskeletal:        General: Normal range of motion.  Skin:    General: Skin is warm and dry.     Findings: No rash.  Neurological:     General: No focal deficit present.     Mental Status: She is alert and oriented to person, place, and time.   EFM: 140/mod/+a/-d  MAU Course  Procedures  MDM 37 y.o. [id number] at [redacted]w[redacted]d presenting for pelvic pain/pressure. Symptoms most concerning and consistent with round ligament pain given description that pain is worse w certain positions, but cannot rule out preterm contractions/labor. She appears uncomfortable, but otherwise stable, cervix is visually closed. Wet prep and UA ordered. Ordered Flexeril, pt considering if she would want to take it. Encouraged  increased PO hydration. Will await results and re-eval after Flexeril if she takes it.  8:47 PM  Patient handed off to Sandra Barlow, NP at 2047.  Sandra Aland, MD OB Fellow, Faculty Practice [facility], Center for [facility]   I resumed care of this patient at 2100 --------------------  MDM  HIGH  - No evidence of PTL  - Wet Prep and U/A negative - Likely MSK pain vs Round ligament pain   NST reactive  FHR : Cat  1 reactive at 2104 Baseline: 130 Variability: Moderate   Accelerations: present  Decelerations: absent Toco: irregular ctx vs irritability    I have reviewed the patient chart and performed the physical exam . I have ordered & interpreted the lab results and reviewed and interpreted the NST Medications ordered as stated below.  A/P as described below.  Counseling and education provided and patient agreeable  with plan as described below. Verbalized understanding.     Assessment and Plan   1. Pelvic pressure in pregnancy (Primary)  2. Class 3 severe obesity due to excess calories with body mass index (BMI) of 40.0 to 44.9 in adult, unspecified whether serious comorbidity present (HCC)  3. Pain of round ligament affecting pregnancy, antepartum  4. [redacted] weeks gestation of pregnancy   Meds ordered this encounter  Medications   cyclobenzaprine (FLEXERIL) tablet 10 mg     Patient discharged from MAU in stable condition with return precautions Belly band encouraged   Future Appointments  Date Time Provider Department Center  11/27/2023  8:45 AM [facility] NST [facility]  12/03/2023  8:00 AM [facility] PROVIDER 1 [facility]  12/03/2023  8:30 AM [facility] US4 [facility]  12/11/2023  8:00 AM [facility] PROVIDER 1 [facility]  12/11/2023  8:30 AM [facility] US4 [facility]  12/18/2023  9:00 AM [facility] PROVIDER 1 [facility]  12/18/2023  9:30 AM [facility] US3 [facility]    Sandra Barlow, MSN, WHNP-BC [facility], Center for [employer]

## 2023-11-23 LAB — GC/CHLAMYDIA PROBE AMP (~~LOC~~) NOT AT ARMC
Chlamydia: NEGATIVE
Comment: NEGATIVE
Comment: NORMAL
Neisseria Gonorrhea: NEGATIVE

## 2023-11-27 ENCOUNTER — Ambulatory Visit

## 2023-11-27 ENCOUNTER — Ambulatory Visit (HOSPITAL_BASED_OUTPATIENT_CLINIC_OR_DEPARTMENT_OTHER): Admitting: *Deleted

## 2023-11-27 ENCOUNTER — Ambulatory Visit: Attending: Obstetrics and Gynecology

## 2023-11-27 DIAGNOSIS — Z8632 Personal history of gestational diabetes: Secondary | ICD-10-CM | POA: Diagnosis not present

## 2023-11-27 DIAGNOSIS — O09293 Supervision of pregnancy with other poor reproductive or obstetric history, third trimester: Secondary | ICD-10-CM | POA: Insufficient documentation

## 2023-11-27 DIAGNOSIS — Z3A34 34 weeks gestation of pregnancy: Secondary | ICD-10-CM | POA: Diagnosis not present

## 2023-11-27 DIAGNOSIS — O09299 Supervision of pregnancy with other poor reproductive or obstetric history, unspecified trimester: Secondary | ICD-10-CM | POA: Diagnosis not present

## 2023-11-27 DIAGNOSIS — O99213 Obesity complicating pregnancy, third trimester: Secondary | ICD-10-CM

## 2023-11-27 NOTE — Procedures (Signed)
 MERSEDES ALBER 1986/03/12 [redacted]w[redacted]d  Fetus A Non-Stress Test Interpretation for 11/27/23- nst only  Indication:  hx GDM, obese  Fetal Heart Rate A Mode: External Baseline Rate (A): 140 bpm Variability: Moderate Accelerations: 15 x 15 Decelerations: None Multiple birth?: No  Uterine Activity Mode: Toco Contraction Frequency (min): none Resting Tone Palpated: Relaxed  Interpretation (Fetal Testing) Nonstress Test Interpretation: Reactive Comments: Traicng reviiewed byDR Arnie Bibber

## 2023-12-03 ENCOUNTER — Ambulatory Visit (HOSPITAL_BASED_OUTPATIENT_CLINIC_OR_DEPARTMENT_OTHER): Admitting: Obstetrics

## 2023-12-03 ENCOUNTER — Ambulatory Visit: Attending: Obstetrics and Gynecology

## 2023-12-03 VITALS — BP 111/67

## 2023-12-03 DIAGNOSIS — O99213 Obesity complicating pregnancy, third trimester: Secondary | ICD-10-CM | POA: Insufficient documentation

## 2023-12-03 DIAGNOSIS — D259 Leiomyoma of uterus, unspecified: Secondary | ICD-10-CM | POA: Diagnosis present

## 2023-12-03 DIAGNOSIS — J45909 Unspecified asthma, uncomplicated: Secondary | ICD-10-CM | POA: Insufficient documentation

## 2023-12-03 DIAGNOSIS — O09899 Supervision of other high risk pregnancies, unspecified trimester: Secondary | ICD-10-CM | POA: Diagnosis present

## 2023-12-03 DIAGNOSIS — O09299 Supervision of pregnancy with other poor reproductive or obstetric history, unspecified trimester: Secondary | ICD-10-CM | POA: Insufficient documentation

## 2023-12-03 DIAGNOSIS — O09523 Supervision of elderly multigravida, third trimester: Secondary | ICD-10-CM

## 2023-12-03 DIAGNOSIS — Z8632 Personal history of gestational diabetes: Secondary | ICD-10-CM | POA: Diagnosis present

## 2023-12-03 DIAGNOSIS — Z3A35 35 weeks gestation of pregnancy: Secondary | ICD-10-CM | POA: Insufficient documentation

## 2023-12-03 DIAGNOSIS — O99013 Anemia complicating pregnancy, third trimester: Secondary | ICD-10-CM

## 2023-12-03 DIAGNOSIS — E669 Obesity, unspecified: Secondary | ICD-10-CM

## 2023-12-03 DIAGNOSIS — O0990 Supervision of high risk pregnancy, unspecified, unspecified trimester: Secondary | ICD-10-CM

## 2023-12-03 DIAGNOSIS — O3413 Maternal care for benign tumor of corpus uteri, third trimester: Secondary | ICD-10-CM | POA: Diagnosis present

## 2023-12-03 DIAGNOSIS — D563 Thalassemia minor: Secondary | ICD-10-CM

## 2023-12-03 DIAGNOSIS — D219 Benign neoplasm of connective and other soft tissue, unspecified: Secondary | ICD-10-CM | POA: Diagnosis present

## 2023-12-03 DIAGNOSIS — O34219 Maternal care for unspecified type scar from previous cesarean delivery: Secondary | ICD-10-CM

## 2023-12-03 DIAGNOSIS — O99513 Diseases of the respiratory system complicating pregnancy, third trimester: Secondary | ICD-10-CM

## 2023-12-03 DIAGNOSIS — O09293 Supervision of pregnancy with other poor reproductive or obstetric history, third trimester: Secondary | ICD-10-CM

## 2023-12-03 NOTE — Progress Notes (Signed)
 MFM Consult Note  Sandra Krueger is currently at 35 weeks and 3 days.  She has been followed due to advanced maternal age (38 years old) and maternal obesity with a BMI of 47.  She denies any problems since her last exam and has screened negative for gestational diabetes in her current pregnancy.    A biophysical profile performed today was 8/8.   There was normal amniotic fluid noted with a total AFI of 10.05 centimeters.    The fetus is in the vertex presentation.    Due to maternal obesity, delivery is recommended at around 39 weeks.    She will return in 1 week for another BPP and growth scan.    The patient stated that all of her questions were answered today.  A total of 10 minutes was spent counseling and coordinating the care for this patient.  Greater than 50% of the time was spent in direct face-to-face contact.

## 2023-12-11 ENCOUNTER — Other Ambulatory Visit: Payer: Self-pay | Admitting: *Deleted

## 2023-12-11 ENCOUNTER — Encounter: Payer: Self-pay | Admitting: Advanced Practice Midwife

## 2023-12-11 ENCOUNTER — Ambulatory Visit: Attending: Obstetrics and Gynecology

## 2023-12-11 ENCOUNTER — Ambulatory Visit (HOSPITAL_BASED_OUTPATIENT_CLINIC_OR_DEPARTMENT_OTHER): Admitting: Obstetrics

## 2023-12-11 ENCOUNTER — Ambulatory Visit: Admitting: Advanced Practice Midwife

## 2023-12-11 VITALS — BP 113/77 | HR 97 | Wt 256.6 lb

## 2023-12-11 DIAGNOSIS — D259 Leiomyoma of uterus, unspecified: Secondary | ICD-10-CM | POA: Diagnosis present

## 2023-12-11 DIAGNOSIS — O09899 Supervision of other high risk pregnancies, unspecified trimester: Secondary | ICD-10-CM

## 2023-12-11 DIAGNOSIS — O3413 Maternal care for benign tumor of corpus uteri, third trimester: Secondary | ICD-10-CM | POA: Insufficient documentation

## 2023-12-11 DIAGNOSIS — O99213 Obesity complicating pregnancy, third trimester: Secondary | ICD-10-CM

## 2023-12-11 DIAGNOSIS — O09523 Supervision of elderly multigravida, third trimester: Secondary | ICD-10-CM

## 2023-12-11 DIAGNOSIS — E669 Obesity, unspecified: Secondary | ICD-10-CM | POA: Diagnosis not present

## 2023-12-11 DIAGNOSIS — O3429 Maternal care due to uterine scar from other previous surgery: Secondary | ICD-10-CM

## 2023-12-11 DIAGNOSIS — O09893 Supervision of other high risk pregnancies, third trimester: Secondary | ICD-10-CM | POA: Diagnosis not present

## 2023-12-11 DIAGNOSIS — D563 Thalassemia minor: Secondary | ICD-10-CM

## 2023-12-11 DIAGNOSIS — Z98891 History of uterine scar from previous surgery: Secondary | ICD-10-CM | POA: Diagnosis not present

## 2023-12-11 DIAGNOSIS — O34219 Maternal care for unspecified type scar from previous cesarean delivery: Secondary | ICD-10-CM

## 2023-12-11 DIAGNOSIS — O09299 Supervision of pregnancy with other poor reproductive or obstetric history, unspecified trimester: Secondary | ICD-10-CM

## 2023-12-11 DIAGNOSIS — O0993 Supervision of high risk pregnancy, unspecified, third trimester: Secondary | ICD-10-CM | POA: Diagnosis not present

## 2023-12-11 DIAGNOSIS — Z8632 Personal history of gestational diabetes: Secondary | ICD-10-CM

## 2023-12-11 DIAGNOSIS — Z3A36 36 weeks gestation of pregnancy: Secondary | ICD-10-CM

## 2023-12-11 DIAGNOSIS — Z9889 Other specified postprocedural states: Secondary | ICD-10-CM | POA: Diagnosis not present

## 2023-12-11 DIAGNOSIS — Z6841 Body Mass Index (BMI) 40.0 and over, adult: Secondary | ICD-10-CM

## 2023-12-11 DIAGNOSIS — O09293 Supervision of pregnancy with other poor reproductive or obstetric history, third trimester: Secondary | ICD-10-CM

## 2023-12-11 DIAGNOSIS — O0990 Supervision of high risk pregnancy, unspecified, unspecified trimester: Secondary | ICD-10-CM

## 2023-12-11 NOTE — Progress Notes (Signed)
 MFM Consult Note  Sandra Krueger is currently at 36 weeks and 4 days. She has been followed due to maternal obesity with a BMI of 47 and advanced maternal age.  She denies any problems since her last exam and has screened negative for gestational diabetes in her current pregnancy.  On today's exam, the overall EFW of 6 pounds 8 ounces measures at the 52nd percentile for her gestational age.    There was normal amniotic fluid noted with a total AFI of 14.78 cm.  Due to maternal obesity delivery is recommended at around 39 weeks.  The patient has stated that she would like to attempt a VBAC.    Her past obstetrical and gynecological history includes the following:  1.  NSVD at 41 weeks. 2.  Robotic myomectomy for treatment of fibroids. 3.  NSVD at 39+ weeks. 4.  C-section at 39 weeks due to breech presentation.  The patient reports that she was initially brought in for induction, however the fetus turned during the induction and she required a C-section.  Based on this history, a VBAC may be attempted as long as she is willing to accept the risk of uterine rupture (roughly 1%).    The patient will discuss scheduling an induction with you at around 39 weeks during her next prenatal visit.  She would prefer to schedule the induction on Dec 28, 2023.    She will return in 1 week for another BPP.    The patient stated that all of her questions were answered today.  A total of 20 minutes was spent counseling and coordinating the care for this patient.  Greater than 50% of the time was spent in direct face-to-face contact.

## 2023-12-11 NOTE — Progress Notes (Signed)
   PRENATAL VISIT NOTE  Subjective:  Sandra Krueger is a 38 y.o. [phone] at [redacted]w[redacted]d being seen today for ongoing prenatal care.  She is currently monitored for the following issues for this low-risk pregnancy and has BMI 40.0-44.9, adult (HCC); Low TSH level; Alpha thalassemia silent carrier; History of primary cesarean section; Supervision of high risk pregnancy, antepartum; AMA (advanced maternal age) multigravida 35+; History of gestational diabetes mellitus (GDM) in prior pregnancy, currently pregnant; Asthma; Short interval between pregnancies affecting pregnancy, antepartum; Fibroids; History of myomectomy; and Trichomonal vaginitis during pregnancy in second trimester on their problem list.   Patient reports occasional contractions.  Contractions: Regular.  .  Movement: Present. Denies leaking of fluid.   The following portions of the patient's history were reviewed and updated as appropriate: allergies, current medications, past family history, past medical history, past social history, past surgical history and problem list.   Objective:   Vitals:   12/11/23 1117  BP: 113/77  Pulse: 97  Weight: 256 lb 9.6 oz (116.4 kg)    Fetal Status: Fetal Heart Rate (bpm): 155   Movement: Present     General:  Alert, oriented and cooperative. Patient is in no acute distress.  Skin: Skin is warm and dry. No rash noted.   Cardiovascular: Normal heart rate noted  Respiratory: Normal respiratory effort, no problems with respiration noted  Abdomen: Soft, gravid, appropriate for gestational age.  Pain/Pressure: Present     Pelvic: Cervical exam performed in the presence of a chaperone Dilation: 1 Effacement (%): 0 Station: Ballotable  Extremities: Normal range of motion.  Edema: Trace  Mental Status: Normal mood and affect. Normal behavior. Normal judgment and thought content.   Assessment and Plan:  Pregnancy: [id number] at [redacted]w[redacted]d 1. Supervision of high risk pregnancy, antepartum (Primary) -  Culture, beta strep (group b only)  2. Short interval between pregnancies affecting pregnancy, antepartum  3. History of myomectomy- - We do not have detailed operative note of myomectomy performed in 2015 at [facility]. However, patient had 2 successful vaginal deliveries following myomectomy surgery.  4. History of primary cesarean section - For breech. Desires TOLAC - Has discussed w/ MFM at length. Schedule w/ MD for Consent.    5. History of gestational diabetes mellitus (GDM) in prior pregnancy, currently pregnant - Nml GT this pregnancy   6. Multigravida of advanced maternal age in third trimester - Antenatal testing per MFM  7. BMI 40.0-44.9, adult (HCC) - Antenatal testing per MFM - MFM recommends 39 week IOL. Scheduled for 5/16. Orders placed.    8. Alpha thalassemia silent carrier - Pt declines any further testing.   9. [redacted] weeks gestation of pregnancy  Preterm labor symptoms and general obstetric precautions including but not limited to vaginal bleeding, contractions, leaking of fluid and fetal movement were reviewed in detail with the patient. Please refer to After Visit Summary for other counseling recommendations.   Return in about 1 week (around 12/18/2023) for [facility] MD for [facility] consent .  Future Appointments  Date Time Provider Department Center  12/18/2023  9:00 AM [facility] PROVIDER 1 [facility]  12/18/2023  9:30 AM [facility] US3 [facility]  12/18/2023 10:35 AM Jan Mcgill, MD [facility]    [name]  Felipe Horton, CNM [facility] for [facility]

## 2023-12-13 ENCOUNTER — Encounter (HOSPITAL_COMMUNITY): Payer: Self-pay | Admitting: *Deleted

## 2023-12-13 ENCOUNTER — Telehealth (HOSPITAL_COMMUNITY): Payer: Self-pay | Admitting: *Deleted

## 2023-12-13 NOTE — Telephone Encounter (Signed)
 Preadmission screen

## 2023-12-14 LAB — CULTURE, BETA STREP (GROUP B ONLY): Strep Gp B Culture: NEGATIVE

## 2023-12-18 ENCOUNTER — Ambulatory Visit: Admitting: Obstetrics and Gynecology

## 2023-12-18 ENCOUNTER — Ambulatory Visit: Attending: Obstetrics and Gynecology | Admitting: Maternal & Fetal Medicine

## 2023-12-18 ENCOUNTER — Ambulatory Visit

## 2023-12-18 ENCOUNTER — Encounter: Payer: Self-pay | Admitting: Obstetrics and Gynecology

## 2023-12-18 VITALS — BP 115/66 | HR 94

## 2023-12-18 VITALS — BP 112/77 | HR 92 | Wt 254.0 lb

## 2023-12-18 DIAGNOSIS — O09299 Supervision of pregnancy with other poor reproductive or obstetric history, unspecified trimester: Secondary | ICD-10-CM

## 2023-12-18 DIAGNOSIS — O09893 Supervision of other high risk pregnancies, third trimester: Secondary | ICD-10-CM

## 2023-12-18 DIAGNOSIS — O0993 Supervision of high risk pregnancy, unspecified, third trimester: Secondary | ICD-10-CM

## 2023-12-18 DIAGNOSIS — O0990 Supervision of high risk pregnancy, unspecified, unspecified trimester: Secondary | ICD-10-CM

## 2023-12-18 DIAGNOSIS — Z9889 Other specified postprocedural states: Secondary | ICD-10-CM

## 2023-12-18 DIAGNOSIS — O09899 Supervision of other high risk pregnancies, unspecified trimester: Secondary | ICD-10-CM

## 2023-12-18 DIAGNOSIS — J45909 Unspecified asthma, uncomplicated: Secondary | ICD-10-CM

## 2023-12-18 DIAGNOSIS — O99213 Obesity complicating pregnancy, third trimester: Secondary | ICD-10-CM | POA: Insufficient documentation

## 2023-12-18 DIAGNOSIS — O09523 Supervision of elderly multigravida, third trimester: Secondary | ICD-10-CM

## 2023-12-18 DIAGNOSIS — E669 Obesity, unspecified: Secondary | ICD-10-CM

## 2023-12-18 DIAGNOSIS — Z8632 Personal history of gestational diabetes: Secondary | ICD-10-CM

## 2023-12-18 DIAGNOSIS — Z3A37 37 weeks gestation of pregnancy: Secondary | ICD-10-CM

## 2023-12-18 DIAGNOSIS — Z6841 Body Mass Index (BMI) 40.0 and over, adult: Secondary | ICD-10-CM | POA: Diagnosis not present

## 2023-12-18 DIAGNOSIS — Z98891 History of uterine scar from previous surgery: Secondary | ICD-10-CM

## 2023-12-18 DIAGNOSIS — O09293 Supervision of pregnancy with other poor reproductive or obstetric history, third trimester: Secondary | ICD-10-CM

## 2023-12-18 DIAGNOSIS — O34219 Maternal care for unspecified type scar from previous cesarean delivery: Secondary | ICD-10-CM

## 2023-12-18 DIAGNOSIS — D219 Benign neoplasm of connective and other soft tissue, unspecified: Secondary | ICD-10-CM

## 2023-12-18 DIAGNOSIS — R7989 Other specified abnormal findings of blood chemistry: Secondary | ICD-10-CM | POA: Diagnosis not present

## 2023-12-18 DIAGNOSIS — D563 Thalassemia minor: Secondary | ICD-10-CM

## 2023-12-18 NOTE — Progress Notes (Signed)
   PRENATAL VISIT NOTE  Subjective:  Sandra Krueger is a 38 y.o. [phone] at [redacted]w[redacted]d being seen today for ongoing prenatal care.  She is currently monitored for the following issues for this high-risk pregnancy and has BMI 40.0-44.9, adult (HCC); Low TSH level; Alpha thalassemia silent carrier; History of primary cesarean section; Supervision of high risk pregnancy, antepartum; AMA (advanced maternal age) multigravida 35+; History of gestational diabetes mellitus (GDM) in prior pregnancy, currently pregnant; Asthma; Short interval between pregnancies affecting pregnancy, antepartum; Fibroids; History of myomectomy; and Trichomonal vaginitis during pregnancy in second trimester on their problem list.  Patient reports occasional contractions.  Contractions: Not present. Vag. Bleeding: None.  Movement: Present. Denies leaking of fluid.   The following portions of the patient's history were reviewed and updated as appropriate: allergies, current medications, past family history, past medical history, past social history, past surgical history and problem list.   Objective:   Vitals:   12/18/23 1107  BP: 112/77  Pulse: 92  Weight: 254 lb (115.2 kg)    Fetal Status: Fetal Heart Rate (bpm): 148   Movement: Present     General:  Alert, oriented and cooperative. Patient is in no acute distress.  Skin: Skin is warm and dry. No rash noted.   Cardiovascular: Normal heart rate noted  Respiratory: Normal respiratory effort, no problems with respiration noted  Abdomen: Soft, gravid, appropriate for gestational age.  Pain/Pressure: Present     Pelvic: Cervical exam performed in the presence of a chaperone Dilation: 1 Effacement (%): 50 Station: -3  Extremities: Normal range of motion.  Edema: None  Mental Status: Normal mood and affect. Normal behavior. Normal judgment and thought content.   Assessment and Plan:  Pregnancy: G5P3013 at [redacted]w[redacted]d  1. [redacted] weeks gestation of pregnancy (Primary)  2. Low TSH  level  3. BMI 40.0-44.9, adult (HCC)  4. Alpha thalassemia silent carrier  5. History of primary cesarean section For breech Reviewed risks/benefits of TOLAC versus RCS in detail. Patient counseled regarding potential vaginal delivery, chance of success, future implications, possible uterine rupture and need for urgent/emergent repeat cesarean. Counseled regarding potential need for repeat c-section for reasons unrelated to first c-section. Counseled regarding scheduled repeat cesarean including risks of bleeding, infection, damage to surrounding tissue, abnormal placentation, implications for future pregnancies. All questions answered.  Patient desires TOLAC, consent signed 12/18/2023.  6. History of gestational diabetes mellitus (GDM) in prior pregnancy, currently pregnant Normal GTT this pregnancy  7. Multigravida of advanced maternal age in third trimester  8. Supervision of high risk pregnancy, antepartum Has induction scheduled for 39 weeks already  9. Short interval between pregnancies affecting pregnancy, antepartum  10. History of myomectomy Laparoscopic myomectomy 2015 Subsequent 2 SVD  Term labor symptoms and general obstetric precautions including but not limited to vaginal bleeding, contractions, leaking of fluid and fetal movement were reviewed in detail with the patient. Please refer to After Visit Summary for other counseling recommendations.   Return in about 1 week (around 12/25/2023) for high OB.  Future Appointments  Date Time Provider Department Center  12/28/2023  6:45 AM [facility] SCHED ROOM [facility] None    Jan Mcgill, MD

## 2023-12-18 NOTE — Progress Notes (Signed)
 After review, MFM consult with provider is not indicated for today  Penney Bowling, DO 12/18/2023 9:48 AM  [facility]

## 2023-12-19 ENCOUNTER — Other Ambulatory Visit: Payer: Self-pay | Admitting: *Deleted

## 2023-12-19 DIAGNOSIS — O09523 Supervision of elderly multigravida, third trimester: Secondary | ICD-10-CM

## 2023-12-19 DIAGNOSIS — Z98891 History of uterine scar from previous surgery: Secondary | ICD-10-CM

## 2023-12-19 DIAGNOSIS — Z9889 Other specified postprocedural states: Secondary | ICD-10-CM

## 2023-12-19 DIAGNOSIS — O09899 Supervision of other high risk pregnancies, unspecified trimester: Secondary | ICD-10-CM

## 2023-12-24 ENCOUNTER — Ambulatory Visit (INDEPENDENT_AMBULATORY_CARE_PROVIDER_SITE_OTHER)

## 2023-12-24 DIAGNOSIS — Z98891 History of uterine scar from previous surgery: Secondary | ICD-10-CM | POA: Diagnosis not present

## 2023-12-24 DIAGNOSIS — Z9889 Other specified postprocedural states: Secondary | ICD-10-CM

## 2023-12-24 DIAGNOSIS — O09523 Supervision of elderly multigravida, third trimester: Secondary | ICD-10-CM

## 2023-12-24 DIAGNOSIS — O09893 Supervision of other high risk pregnancies, third trimester: Secondary | ICD-10-CM

## 2023-12-24 DIAGNOSIS — Z3A38 38 weeks gestation of pregnancy: Secondary | ICD-10-CM

## 2023-12-24 DIAGNOSIS — O09899 Supervision of other high risk pregnancies, unspecified trimester: Secondary | ICD-10-CM

## 2023-12-28 ENCOUNTER — Encounter (HOSPITAL_COMMUNITY): Payer: Self-pay | Admitting: Anesthesiology

## 2023-12-28 ENCOUNTER — Inpatient Hospital Stay (HOSPITAL_COMMUNITY)

## 2023-12-28 ENCOUNTER — Inpatient Hospital Stay (HOSPITAL_COMMUNITY)
Admission: AD | Admit: 2023-12-28 | Discharge: 2023-12-28 | Disposition: A | Discharge status: Home / Self Care | Source: Home / Self Care | Attending: Obstetrics and Gynecology | Admitting: Obstetrics and Gynecology

## 2023-12-28 ENCOUNTER — Other Ambulatory Visit: Payer: Self-pay

## 2023-12-28 ENCOUNTER — Encounter (HOSPITAL_COMMUNITY): Payer: Self-pay

## 2023-12-28 ENCOUNTER — Encounter (HOSPITAL_COMMUNITY): Payer: Self-pay | Admitting: Family Medicine

## 2023-12-28 ENCOUNTER — Inpatient Hospital Stay (HOSPITAL_COMMUNITY)
Admission: RE | Admit: 2023-12-28 | Discharge: 2023-12-30 | DRG: 806 | Disposition: A | Discharge status: Home / Self Care | Attending: Obstetrics and Gynecology | Admitting: Obstetrics and Gynecology

## 2023-12-28 DIAGNOSIS — Z6841 Body Mass Index (BMI) 40.0 and over, adult: Secondary | ICD-10-CM

## 2023-12-28 DIAGNOSIS — Z148 Genetic carrier of other disease: Secondary | ICD-10-CM | POA: Diagnosis not present

## 2023-12-28 DIAGNOSIS — O09899 Supervision of other high risk pregnancies, unspecified trimester: Secondary | ICD-10-CM

## 2023-12-28 DIAGNOSIS — O479 False labor, unspecified: Secondary | ICD-10-CM

## 2023-12-28 DIAGNOSIS — Z3A39 39 weeks gestation of pregnancy: Secondary | ICD-10-CM | POA: Diagnosis not present

## 2023-12-28 DIAGNOSIS — Z8616 Personal history of COVID-19: Secondary | ICD-10-CM | POA: Diagnosis not present

## 2023-12-28 DIAGNOSIS — O471 False labor at or after 37 completed weeks of gestation: Secondary | ICD-10-CM | POA: Insufficient documentation

## 2023-12-28 DIAGNOSIS — O4593 Premature separation of placenta, unspecified, third trimester: Secondary | ICD-10-CM | POA: Diagnosis present

## 2023-12-28 DIAGNOSIS — Z8632 Personal history of gestational diabetes: Secondary | ICD-10-CM

## 2023-12-28 DIAGNOSIS — D62 Acute posthemorrhagic anemia: Secondary | ICD-10-CM | POA: Diagnosis not present

## 2023-12-28 DIAGNOSIS — O99214 Obesity complicating childbirth: Principal | ICD-10-CM | POA: Diagnosis present

## 2023-12-28 DIAGNOSIS — D219 Benign neoplasm of connective and other soft tissue, unspecified: Secondary | ICD-10-CM

## 2023-12-28 DIAGNOSIS — O34211 Maternal care for low transverse scar from previous cesarean delivery: Secondary | ICD-10-CM | POA: Diagnosis not present

## 2023-12-28 DIAGNOSIS — O34219 Maternal care for unspecified type scar from previous cesarean delivery: Secondary | ICD-10-CM | POA: Diagnosis present

## 2023-12-28 DIAGNOSIS — Z8249 Family history of ischemic heart disease and other diseases of the circulatory system: Secondary | ICD-10-CM

## 2023-12-28 DIAGNOSIS — Z98891 History of uterine scar from previous surgery: Secondary | ICD-10-CM

## 2023-12-28 DIAGNOSIS — O09523 Supervision of elderly multigravida, third trimester: Secondary | ICD-10-CM | POA: Diagnosis not present

## 2023-12-28 DIAGNOSIS — O99213 Obesity complicating pregnancy, third trimester: Principal | ICD-10-CM | POA: Diagnosis present

## 2023-12-28 DIAGNOSIS — Z833 Family history of diabetes mellitus: Secondary | ICD-10-CM | POA: Diagnosis not present

## 2023-12-28 DIAGNOSIS — Z9889 Other specified postprocedural states: Secondary | ICD-10-CM

## 2023-12-28 DIAGNOSIS — O26893 Other specified pregnancy related conditions, third trimester: Secondary | ICD-10-CM | POA: Diagnosis present

## 2023-12-28 DIAGNOSIS — O0990 Supervision of high risk pregnancy, unspecified, unspecified trimester: Secondary | ICD-10-CM

## 2023-12-28 DIAGNOSIS — O4590 Premature separation of placenta, unspecified, unspecified trimester: Secondary | ICD-10-CM | POA: Insufficient documentation

## 2023-12-28 DIAGNOSIS — O09299 Supervision of pregnancy with other poor reproductive or obstetric history, unspecified trimester: Secondary | ICD-10-CM

## 2023-12-28 DIAGNOSIS — O09529 Supervision of elderly multigravida, unspecified trimester: Secondary | ICD-10-CM

## 2023-12-28 LAB — CBC
HCT: 38.8 % (ref 36.0–46.0)
Hemoglobin: 12.3 g/dL (ref 12.0–15.0)
MCH: 26.3 pg (ref 26.0–34.0)
MCHC: 31.7 g/dL (ref 30.0–36.0)
MCV: 83.1 fL (ref 80.0–100.0)
Platelets: 243 10*3/uL (ref 150–400)
RBC: 4.67 MIL/uL (ref 3.87–5.11)
RDW: 14.6 % (ref 11.5–15.5)
WBC: 6.6 10*3/uL (ref 4.0–10.5)
nRBC: 0 % (ref 0.0–0.2)

## 2023-12-28 LAB — COMPREHENSIVE METABOLIC PANEL WITH GFR
ALT: 12 U/L (ref 0–44)
AST: 17 U/L (ref 15–41)
Albumin: 3.1 g/dL — ABNORMAL LOW (ref 3.5–5.0)
Alkaline Phosphatase: 1094 U/L — ABNORMAL HIGH (ref 38–126)
Anion gap: 11 (ref 5–15)
BUN: 8 mg/dL (ref 6–20)
CO2: 19 mmol/L — ABNORMAL LOW (ref 22–32)
Calcium: 9 mg/dL (ref 8.9–10.3)
Chloride: 104 mmol/L (ref 98–111)
Creatinine, Ser: 0.51 mg/dL (ref 0.44–1.00)
GFR, Estimated: >60 mL/min (ref >60)
Glucose, Bld: 73 mg/dL (ref 70–99)
Potassium: 3.5 mmol/L (ref 3.5–5.1)
Sodium: 134 mmol/L — ABNORMAL LOW (ref 135–145)
Total Bilirubin: 0.3 mg/dL (ref 0.0–1.2)
Total Protein: 7.3 g/dL (ref 6.5–8.1)

## 2023-12-28 LAB — TYPE AND SCREEN
ABO/RH(D): O POS
Antibody Screen: NEGATIVE

## 2023-12-28 MED ORDER — EPHEDRINE 5 MG/ML INJ: 10.0000 mg | INTRAVENOUS | Status: DC | PRN

## 2023-12-28 MED ORDER — OXYTOCIN-SODIUM CHLORIDE 30-0.9 UT/500ML-% IV SOLN
1.0000 m[IU]/min | INTRAVENOUS | Status: DC
  Administered 2023-12-28: 2 m[IU]/min via INTRAVENOUS
  Filled 2023-12-28: qty 500

## 2023-12-28 MED ORDER — DIPHENHYDRAMINE HCL 50 MG/ML IJ SOLN: 12.5000 mg | INTRAMUSCULAR | Status: DC | PRN

## 2023-12-28 MED ORDER — OXYTOCIN-SODIUM CHLORIDE 30-0.9 UT/500ML-% IV SOLN: 2.5000 [IU]/h | INTRAVENOUS | Status: DC

## 2023-12-28 MED ORDER — LACTATED RINGERS IV SOLN: INTRAVENOUS | Status: DC

## 2023-12-28 MED ORDER — FENTANYL-BUPIVACAINE-NACL 0.5-0.125-0.9 MG/250ML-% EP SOLN
EPIDURAL | Status: AC
  Filled 2023-12-28: qty 250

## 2023-12-28 MED ORDER — LACTATED RINGERS IV SOLN: 500.0000 mL | INTRAVENOUS | Status: DC | PRN

## 2023-12-28 MED ORDER — LIDOCAINE HCL (PF) 1 % IJ SOLN: 30.0000 mL | INTRAMUSCULAR | Status: DC | PRN

## 2023-12-28 MED ORDER — LACTATED RINGERS IV SOLN: 500.0000 mL | Freq: Once | INTRAVENOUS | Status: DC

## 2023-12-28 MED ORDER — PHENYLEPHRINE 80 MCG/ML (10ML) SYRINGE FOR IV PUSH (FOR BLOOD PRESSURE SUPPORT)
PREFILLED_SYRINGE | INTRAVENOUS | Status: AC
  Filled 2023-12-28: qty 10

## 2023-12-28 MED ORDER — PHENYLEPHRINE 80 MCG/ML (10ML) SYRINGE FOR IV PUSH (FOR BLOOD PRESSURE SUPPORT): 80.0000 ug | PREFILLED_SYRINGE | INTRAVENOUS | Status: DC | PRN

## 2023-12-28 MED ORDER — OXYCODONE-ACETAMINOPHEN 5-325 MG PO TABS: 2.0000 | ORAL_TABLET | ORAL | Status: DC | PRN

## 2023-12-28 MED ORDER — SOD CITRATE-CITRIC ACID 500-334 MG/5ML PO SOLN: 30.0000 mL | ORAL | Status: DC | PRN

## 2023-12-28 MED ORDER — ONDANSETRON HCL 4 MG/2ML IJ SOLN: 4.0000 mg | Freq: Four times a day (QID) | INTRAMUSCULAR | Status: DC | PRN

## 2023-12-28 MED ORDER — TERBUTALINE SULFATE 1 MG/ML IJ SOLN
0.2500 mg | Freq: Once | INTRAMUSCULAR | Status: AC | PRN
  Administered 2023-12-28: 0.25 mg via SUBCUTANEOUS
  Filled 2023-12-28: qty 1

## 2023-12-28 MED ORDER — ACETAMINOPHEN 325 MG PO TABS: 650.0000 mg | ORAL_TABLET | ORAL | Status: DC | PRN

## 2023-12-28 MED ORDER — OXYCODONE-ACETAMINOPHEN 5-325 MG PO TABS: 1.0000 | ORAL_TABLET | ORAL | Status: DC | PRN

## 2023-12-28 MED ORDER — FENTANYL-BUPIVACAINE-NACL 0.5-0.125-0.9 MG/250ML-% EP SOLN: 12.0000 mL/h | EPIDURAL | Status: DC | PRN

## 2023-12-28 MED ORDER — OXYTOCIN BOLUS FROM INFUSION
333.0000 mL | Freq: Once | INTRAVENOUS | Status: AC
  Administered 2023-12-29: 333 mL via INTRAVENOUS

## 2023-12-28 MED ORDER — FLEET ENEMA RE ENEM: 1.0000 | ENEMA | RECTAL | Status: DC | PRN

## 2023-12-28 MED ORDER — FENTANYL CITRATE (PF) 100 MCG/2ML IJ SOLN
50.0000 ug | INTRAMUSCULAR | Status: DC | PRN
  Administered 2023-12-28: 100 ug via INTRAVENOUS
  Administered 2023-12-28: 50 ug via INTRAVENOUS
  Filled 2023-12-28 (×2): qty 2

## 2023-12-28 NOTE — MAU Provider Note (Signed)
  S: [name] is a 38 y.o. [phone] at [redacted]w[redacted]d  who presents to MAU today complaining of irregular 3-4/10 contractions since 0600. She endorses bloody show with wiping, but no frank vaginal bleeding. She denies LOF. She reports normal fetal movement.    O: BP 119/74   Pulse 85   Temp 97.7 F (36.5 C)   Resp 18   Ht 5\' 4"  (1.626 m)   Wt 116.6 kg   LMP  (LMP Unknown)   SpO2 100%   BMI 44.11 kg/m  GENERAL: Well-developed, well-nourished female in no acute distress.  HEAD: Normocephalic, atraumatic.  CHEST: Normal effort of breathing, regular heart rate ABDOMEN: Soft, nontender, gravid  Cervical exam:  Dilation: 1.5 Effacement (%): 50 Cervical Position: Posterior Station: -3 Presentation: Vertex Exam by:: N. Adele Admire, RN   Fetal Monitoring: Baseline: 140 Variability: moderate Accelerations: present Decelerations: absent Contractions: rare   A: SIUP at [redacted]w[redacted]d  False labor  P: Discharge home with strict return precautions Continue prenatal care as scheduled (due to be called in for IOL anytime)  Daril Warga, MD 12/28/2023 7:40 AM

## 2023-12-28 NOTE — Progress Notes (Signed)
 Patient ID: MERVA MIRACLE, female   DOB: 1986-05-16, 38 y.o.   MRN: [id number]  Subjective: -Care assumed of 38 y.o. [id number] at [redacted]w[redacted]d who presents for IOL. In room to meet acquaintance of patient and family.  Patient reports rectal pressure and requests cervical exam.   Objective: BP 112/67   Pulse 82   Temp 97.8 F (36.6 C) (Oral)   Resp 19   Ht 5\' 4"  (1.626 m)   Wt 116.1 kg   LMP  (LMP Unknown)   SpO2 95%   BMI 43.94 kg/m  No intake/output data recorded. No intake/output data recorded.  Fetal Monitoring: FHT: 135 bpm, Mod Var, + Variable Decels, +Accels UC: Palpates mild in lower abdominal area    Physical Exam: General appearance: alert, well appearing, and in no distress. Chest: normal rate and regular rhythm.  not examined. Abdominal exam: Gravid, Soft RT. Extremities: No apparent edema Skin exam: Warm Dry  Vaginal Exam: SVE:   Dilation: 6.5 Effacement (%): 50 Station: -3, -2 Exam by:: Loetta Ringer, CNM Membranes:AROM x 3 hrs Internal Monitors: None  Augmentation/Induction: Pitocin :51mUn/min Cytotec : None  Assessment:  IUP at 39 weeks Cat II FT  Active Labor TOLAC  Plan: -Patient encouraged to change position to promote comfort and resolution of variables.  -Anticipate SVD  Wynell Heath Hollyann Pablo,MSN, CNM 12/28/2023, 8:45 PM

## 2023-12-28 NOTE — MAU Note (Signed)
 Sandra Krueger is a 38 y.o. at [redacted]w[redacted]d here in MAU reporting coming in for IOL at 0645 but when she arrived BS stated she had not been called yet. Pt had gone to the BR at 0600 and when she wiped she saw blood on the tissue. Reports baby has slowed down moving the past few wks but thinks due to baby getting bigger. She felt good movement this am walking from the parking lot. Denies LOF. Plans TOLAC - last del by c/s for breech.   LMP: n/a Onset of complaint: 0600 Pain score: 4 119/74 p-85 R-18   T 97.7 FHT: 140  Lab orders placed from triage: labor eval

## 2023-12-28 NOTE — Progress Notes (Signed)
 Labor Progress Note Sandra Krueger is a 38 y.o. [phone] at [redacted]w[redacted]d presented for IOL for BMI 44  S: No questions at this time  O:  BP (!) 128/90 (BP Location: Left Arm)   Pulse 84   Temp 98 F (36.7 C) (Oral)   Resp 18   Ht 5\' 4"  (1.626 m)   Wt 116.1 kg   LMP  (LMP Unknown)   SpO2 95%   BMI 43.94 kg/m  EFM: 135/mod/+a/-d  CVE: Dilation: 3.5 Effacement (%): 50 Station: -3 Presentation: Vertex Exam by:: Raelea Gosse MD   A&P: 38 y.o. [id number] [redacted]w[redacted]d here for IOL for BMI 44 #Labor: Progressing well. S/p FB. Discussed AROM w pt, pt verbally consented, AROM performed with scant amount of clear/bloody fluid. #Pain: Desires unmedicated delivery #FWB: Cat I #GBS negative  #TOLAC, hx myomectomy  Melanie Spires, MD 5:23 PM

## 2023-12-28 NOTE — H&P (Addendum)
 OBSTETRIC ADMISSION HISTORY AND PHYSICAL  Sandra Krueger is a 38 y.o. female [phone] with IUP at [redacted]w[redacted]d by 5 week US  presenting for IOL for . She reports +FMs, No LOF, no VB, no blurry vision, headaches or peripheral edema, and RUQ pain.  She plans on breast feeding. She request interval BTL for birth control. She received her prenatal care at [facility]   Dating: By 5 week US  --->  Estimated Date of Delivery: 01/04/24  Sono:   @[redacted]w[redacted]d , CWD, normal female anatomy, cephalic presentation, posterior placental lie, 2953 gm 6 lb 8 oz 52 %  EFW   Prenatal History/Complications:  - Hx of CS for breech with last delivery; desires TOLAC - Hx of GDM; normal OGTT this pregnancy - BMI 47 - Alpha thalassemia silent carrier  - Uterine fibroids with hx of robotic myomectomy (between 1st and 2nd SVD)  Past Medical History: Past Medical History:  Diagnosis Date   Asthma    Complication of anesthesia    COVID-19 affecting pregnancy in first trimester 03/24/2021   Late July 2022, (see care everywhere for test)   Fibroids    Gestational diabetes    Headache    History of COVID-19 03/17/2021   During early pregnancy 02/2021        Panic attack    last episode approx two months ago per pt    Past Surgical History: Past Surgical History:  Procedure Laterality Date   CESAREAN SECTION N/A 09/29/2021   Procedure: CESAREAN SECTION;  Surgeon: Raynell Caller, MD;  Location: [facility] LD [facility]S;  Service: Obstetrics;  Laterality: N/A;   OTHER SURGICAL HISTORY     fibroids removed   OVARY SURGERY     cyst removed from ? right ovary    Obstetrical History: OB History     Gravida  5   Para  3   Term  3   Preterm      AB  1   Living  3      SAB  1   IAB      Ectopic      Multiple  0   Live Births  3          5 Current               4 SAB 09/2022 [redacted]w[redacted]d             3 Term 09/29/21 [redacted]w[redacted]d  3660 g F CS-LTranv Spinal  Living 8 9  Name: Sandra Krueger  Birth Comments: GDM  Location: [facility] ([facility])  Delivering Clinician: Raynell Caller, MD    2 Term 06/29/18 [redacted]w[redacted]d / 0h 2m 3487 g F Vag-Spont None  Living 8 9  Name: Sandra Krueger,Sandra Krueger  Birth Comments: had BMZ at 36 wk due to subchorionic hemorrhage vs placental lake  Location: [facility] ([facility])  Delivering Clinician: Anice Kerbs, Student-MidWife    1 Term 02/10/09 [redacted]w[redacted]d  2438 g F Vag-Spont None  Living    Birth Comments: wnl- fast labor - 9cm when admitted to hospital  Location: [employer]      Social History Social History   Socioeconomic History   Marital status: Single    Spouse name: Not on file   Number of children: 1   Years of education: Not on file   Highest education level: Not on file  Occupational History   Occupation: daycare  Tobacco Use   Smoking status: Never   Smokeless tobacco:  Never  Vaping Use   Vaping status: Never Used  Substance and Sexual Activity   Alcohol use: Not Currently    Comment: wine occasionally   Drug use: Not Currently    Types: Marijuana    Comment: as a teenager   Sexual activity: Yes    Birth control/protection: None  Other Topics Concern   Not on file  Social History Narrative   Not on file   Social Drivers of Health   Financial Resource Strain: Low Risk  (08/27/2021)   Received from [facility], [facility]   Overall Financial Resource Strain (CARDIA)    Difficulty of Paying Living Expenses: Not hard at all  Food Insecurity: No Food Insecurity (08/27/2021)   Received from [facility], [facility]   Hunger Vital Sign    Worried About Running Out of Food in the Last Year: Never true    Ran Out of Food in the Last Year: Never true  Transportation Needs: No Transportation Needs (08/27/2021)   Received from [facility], [facility]   [facility] - Transportation    Lack of Transportation (Medical): No    Lack of Transportation (Non-Medical): No  Physical Activity:  Inactive (08/27/2021)   Received from [facility], [facility]   Exercise Vital Sign    Days of Exercise per Week: 0 days    Minutes of Exercise per Session: 0 min  Stress: No Stress Concern Present (08/27/2021)   Received from [facility], [facility] - Occupational Stress Questionnaire    Feeling of Stress : Only a little  Social Connections: Unknown (12/16/2021)   Received from [facility], [facility]   Social Network    Social Network: Not on file    Family History: Family History  Problem Relation Age of Onset   Hypertension Mother    Hypertension Father    Diabetes Maternal Aunt    Diabetes Maternal Uncle     Allergies: Allergies  Allergen Reactions   Bee Venom Anaphylaxis   Other Anaphylaxis    All nuts   Tree Extract Anaphylaxis   Gramineae Pollens Rash    No medications prior to admission.     Review of Systems   All systems reviewed and negative except as stated in HPI  unknown if currently breastfeeding. General appearance: alert and cooperative Lungs: clear to auscultation bilaterally Heart: regular rate and rhythm Abdomen: soft, non-tender; bowel sounds normal Pelvic: adequate, proven to 3487g Extremities: Homans sign is negative, no sign of DVT Presentation: cephalic Fetal monitoring: 130/mod/+a/-d Uterine activity: occasional   Dilation: 2 Effacement (%): 50 Station: -3 (5/6)  Prenatal labs: ABO, Rh: O/Positive/-- (10/31 1512) Antibody: Negative (10/31 1512) Rubella: 2.80 (10/31 1512) RPR: Non Reactive (03/06 0838)  HBsAg: Negative (10/31 1512)  HIV: Non Reactive (03/06 0838)  GBS: Negative/-- (04/29 1301)    Lab Results  Component Value Date   GBS Negative 12/11/2023   GTT normal; Hx of GDM Genetic screening  negative, low risk Anatomy US  normal female  Immunization History  Administered Date(s) Administered   Influenza-Unspecified 05/21/2018   Tdap 05/21/2018     Prenatal Transfer Tool  Maternal Diabetes: No Genetic Screening: Normal Maternal Ultrasounds/Referrals: Normal Fetal Ultrasounds or other Referrals:  None Maternal Substance Abuse:  No Significant Maternal Medications:  None Significant Maternal Lab Results: Group B Strep negative Number of Prenatal Visits:greater than 3 verified prenatal visits Maternal Vaccinations: Declined Other  Comments:  None   No results found for this or any previous visit (from the past 24 hours).  Patient Active Problem List   Diagnosis Date Noted   Trichomonal vaginitis during pregnancy in second trimester 07/16/2023   History of myomectomy 06/14/2023   Supervision of high risk pregnancy, antepartum 06/07/2023   AMA (advanced maternal age) multigravida 35+ 06/07/2023   History of gestational diabetes mellitus (GDM) in prior pregnancy, currently pregnant 06/07/2023   Short interval between pregnancies affecting pregnancy, antepartum 06/07/2023   Asthma    Fibroids    History of primary cesarean section 09/29/2021   Alpha thalassemia silent carrier 04/18/2021   Low TSH level 03/30/2021   BMI 40.0-44.9, adult (HCC) 11/16/2017    Assessment/Plan:  KYIRA FRITZE is a 38 y.o. [id number] at [redacted]w[redacted]d here for IOL for BMI desiring TOLAC  #Labor: Discussed IOL w pt in detail, pt amenable to FB. FB placed and balloon filled to 40cc. Pt tolerated well. #Pain: Per pt request #FWB: Cat I #GBS status:  negative #Feeding: Breastmilk  #Reproductive Life planning: Tubal Ligation #Circ:  not applicable  History of primary cesarean section: For breech, has had two prior SVDs, desires TOLAC  History of myomectomy: Laparoscopic myomectomy 2015, subsequently had SVD  BMI 44  Darrow End, MD  12/28/2023, 1:58 AM

## 2023-12-28 NOTE — Progress Notes (Addendum)
 Patient ID: Sandra Krueger, female   DOB: 1985-10-27, 38 y.o.   MRN: [id number]  Subjective: -Called to room d/t prolonged deceleration.  Patient reports lower abdominal pain, but unable to describe.   Objective: BP 112/67   Pulse 82   Temp 97.8 F (36.6 C) (Oral)   Resp 19   Ht 5\' 4"  (1.626 m)   Wt 116.1 kg   LMP  (LMP Unknown)   SpO2 95%   BMI 43.94 kg/m  No intake/output data recorded. No intake/output data recorded.  Fetal Monitoring: FHT: 80 bpm with moderate variability UC: Palpates moderate    Vaginal Exam: SVE:   Dilation: 6.5 Effacement (%): 50 Station: -3, -2 Exam by:: Sandra Krueger, CNM Membranes:AROM x  Internal Monitors: None  Augmentation/Induction: Pitocin :Discontinued Cytotec : None  Assessment:  IUP at 39 weeks Cat II FT   Plan: -Pitocin  discontinued and LR bolus initiated prior to provider at bedside.  -Cervical exam remains unchanged and discussed r/b of fetal scalp electrode including infection, trauma to fetal scalp, and ability to monitor fetal heart rate more accurately.   -Patient and SO w/o q/c and agrees to placement. FSE placed w/o difficulty. -O2 applied.  -After 10 minutes, terbutaline  ordered and Dr. Luis Krueger notified and to bedside.  -Reviewed fetal monitoring and brief discussion regarding C/S if no response to interventions. -Improvement noted. -Titrate O2 to discontinuation.  -Will monitor and reassess need for pitocin  restart after ~ 1 hour.   Sandra Krueger, CNM Advanced Practice Provider, Center for [facility] 12/28/2023, 9:35 PM  Addendum 11:15 PM -Nurse calls provider to bedside for bleeding. Patient continues to report pain with contractions. -Bleeding is moderate to large amt with frank red mucoid blood. No clots.  -Cervical exam notable for negative change in dilation, but effacement improving.  6/80 -Cat I FT -Provider informs family that bleeding is not highly concerning for placental abruption, but bleeding  is notable.  -Discussed exam and Dr. Luis Krueger to bedside to note bleeding. Reviews options for pain management.  -Patient unsure of what she wants to do regarding pain management. -Will remain at bedside.   Sandra Peru MSN, CNM Advanced Practice Provider, Center for [employer]

## 2023-12-29 ENCOUNTER — Encounter (HOSPITAL_COMMUNITY): Payer: Self-pay | Admitting: Family Medicine

## 2023-12-29 DIAGNOSIS — O4590 Premature separation of placenta, unspecified, unspecified trimester: Secondary | ICD-10-CM | POA: Insufficient documentation

## 2023-12-29 LAB — CBC
HCT: 29.1 % — ABNORMAL LOW (ref 36.0–46.0)
Hemoglobin: 9.6 g/dL — ABNORMAL LOW (ref 12.0–15.0)
MCH: 27 pg (ref 26.0–34.0)
MCHC: 33 g/dL (ref 30.0–36.0)
MCV: 82 fL (ref 80.0–100.0)
Platelets: 207 10*3/uL (ref 150–400)
RBC: 3.55 MIL/uL — ABNORMAL LOW (ref 3.87–5.11)
RDW: 14.6 % (ref 11.5–15.5)
WBC: 16.3 10*3/uL — ABNORMAL HIGH (ref 4.0–10.5)
nRBC: 0 % (ref 0.0–0.2)

## 2023-12-29 LAB — RPR: RPR Ser Ql: NONREACTIVE

## 2023-12-29 MED ORDER — DIPHENHYDRAMINE HCL 25 MG PO CAPS
25.0000 mg | ORAL_CAPSULE | Freq: Four times a day (QID) | ORAL | Status: DC | PRN
Start: 2023-12-29 — End: 2023-12-30

## 2023-12-29 MED ORDER — PRENATAL MULTIVITAMIN CH
1.0000 | ORAL_TABLET | Freq: Every day | ORAL | Status: DC
  Administered 2023-12-29 – 2023-12-30 (×2): 1 via ORAL
  Filled 2023-12-29 (×2): qty 1

## 2023-12-29 MED ORDER — COCONUT OIL OIL: 1.0000 | TOPICAL_OIL | Status: DC | PRN

## 2023-12-29 MED ORDER — ONDANSETRON HCL 4 MG/2ML IJ SOLN: 4.0000 mg | INTRAMUSCULAR | Status: DC | PRN

## 2023-12-29 MED ORDER — WITCH HAZEL-GLYCERIN EX PADS: 1.0000 | MEDICATED_PAD | CUTANEOUS | Status: DC | PRN

## 2023-12-29 MED ORDER — SIMETHICONE 80 MG PO CHEW: 80.0000 mg | CHEWABLE_TABLET | ORAL | Status: DC | PRN

## 2023-12-29 MED ORDER — FERROUS SULFATE 325 (65 FE) MG PO TABS
325.0000 mg | ORAL_TABLET | Freq: Every day | ORAL | Status: DC
  Administered 2023-12-29 – 2023-12-30 (×2): 325 mg via ORAL
  Filled 2023-12-29 (×2): qty 1

## 2023-12-29 MED ORDER — ZOLPIDEM TARTRATE 5 MG PO TABS: 5.0000 mg | ORAL_TABLET | Freq: Every evening | ORAL | Status: DC | PRN

## 2023-12-29 MED ORDER — SENNOSIDES-DOCUSATE SODIUM 8.6-50 MG PO TABS
2.0000 | ORAL_TABLET | ORAL | Status: DC
  Administered 2023-12-29 – 2023-12-30 (×2): 2 via ORAL
  Filled 2023-12-29 (×2): qty 2

## 2023-12-29 MED ORDER — BENZOCAINE-MENTHOL 20-0.5 % EX AERO: 1.0000 | INHALATION_SPRAY | CUTANEOUS | Status: DC | PRN

## 2023-12-29 MED ORDER — ONDANSETRON HCL 4 MG PO TABS: 4.0000 mg | ORAL_TABLET | ORAL | Status: DC | PRN

## 2023-12-29 MED ORDER — ACETAMINOPHEN 325 MG PO TABS
650.0000 mg | ORAL_TABLET | ORAL | Status: DC | PRN
  Administered 2023-12-29: 650 mg via ORAL
  Filled 2023-12-29: qty 2

## 2023-12-29 MED ORDER — DIBUCAINE (PERIANAL) 1 % EX OINT: 1.0000 | TOPICAL_OINTMENT | CUTANEOUS | Status: DC | PRN

## 2023-12-29 MED ORDER — TETANUS-DIPHTH-ACELL PERTUSSIS 5-2.5-18.5 LF-MCG/0.5 IM SUSY: 0.5000 mL | PREFILLED_SYRINGE | Freq: Once | INTRAMUSCULAR | Status: DC

## 2023-12-29 MED ORDER — IBUPROFEN 600 MG PO TABS
600.0000 mg | ORAL_TABLET | Freq: Four times a day (QID) | ORAL | Status: DC
  Administered 2023-12-29 – 2023-12-30 (×6): 600 mg via ORAL
  Filled 2023-12-29 (×6): qty 1

## 2023-12-29 NOTE — Progress Notes (Signed)
 POSTPARTUM PROGRESS NOTE  Subjective: Sandra Krueger is a 38 y.o. [id number] s/p SVD at [redacted]w[redacted]d.  She reports she is doing well. No acute events overnight. She denies any problems with ambulating, voiding or po intake. Denies nausea or vomiting. She has passed flatus. Pain is well controlled.  Lochia is moderate.  Objective: Blood pressure 114/60, pulse 77, temperature 98.5 F (36.9 C), temperature source Oral, resp. rate 16, height 5\' 4"  (1.626 m), weight 116.1 kg, SpO2 98%, unknown if currently breastfeeding.  Physical Exam:  General: alert, cooperative and no distress Chest: no respiratory distress Abdomen: soft, non-tender  Uterine Fundus: firm and at level of umbilicus Extremities: No calf swelling or tenderness  Trace edema  Recent Labs    12/28/23 1257 12/29/23 0436  HGB 12.3 9.6*  HCT 38.8 29.1*    Assessment/Plan: Sandra Krueger is a 38 y.o. [id number] s/p SVD at [redacted]w[redacted]d for IOL for BMI.  Routine Postpartum Care: Doing well, pain well-controlled.  -- Continue routine care, lactation support  -- Contraception: interval tubal -- Feeding: breast  Dispo: Plan for discharge 5/18.  Sandra Sorenson, MD OB Fellow 12/29/2023 12:33 PM

## 2023-12-29 NOTE — Discharge Summary (Signed)
 Postpartum Discharge Summary  Date of Service updated***     Patient Name: Sandra Krueger DOB: 1986-05-26 MRN: [id number]  Date of admission: 12/28/2023 Delivery date:12/28/2023 Delivering provider: Loetta Ringer Date of discharge: 12/29/2023  Admitting diagnosis: Obesity affecting pregnancy in third trimester, antepartum [O99.213] Intrauterine pregnancy: [redacted]w[redacted]d     Secondary diagnosis:  Principal Problem:   Vaginal delivery Active Problems:   History of primary cesarean section   AMA (advanced maternal age) multigravida 35+   History of myomectomy   Obesity affecting pregnancy in third trimester, antepartum  Additional problems: ***    Discharge diagnosis: {DX.:23714}                                              Post partum procedures:{Postpartum procedures:23558} Induction: AROM, Pitocin , and IP Foley Complications: None  Hospital course: Induction of Labor With Vaginal Delivery   38 y.o. yo [id number] at [redacted]w[redacted]d was admitted to the hospital 12/28/2023 for induction of labor.  Indication for induction: BMI desiring TOLAC.  Patient had an labor course complicated by h/o C/S and obesity.  Membrane Rupture Time/Date: 4:52 PM,12/28/2023  Delivery Method:VBAC, Spontaneous Operative Delivery:N/A Episiotomy:   Lacerations:    Details of delivery can be found in separate delivery note.  Patient had a postpartum course complicated by***. Patient is discharged home 12/29/23.  Newborn Data: Birth date:12/28/2023 Birth time:11:57 PM Gender:Female-Kadence Living status:Living Apgars: ,  Weight:   Magnesium Sulfate received: {Mag received:30440022} BMZ received: {BMZ received:30440023} Rhophylac:{Rhophylac received:30440032} SWF:{UXN:23557322} T-DaP:{Tdap:23962} Flu: {GUR:42706} RSV Vaccine received: {RSV:31013} Transfusion:{Transfusion received:30440034}  Immunizations received: Immunization History  Administered Date(s) Administered   Influenza-Unspecified 05/21/2018   Tdap  05/21/2018    Physical exam  Vitals:   12/28/23 1903 12/28/23 1922 12/28/23 2128 12/28/23 2138  BP: 125/73 112/67 118/84 109/68  Pulse: 84 82 76 90  Resp: 20 19 18    Temp: 98.1 F (36.7 C) 97.8 F (36.6 C)    TempSrc: Oral Oral    SpO2:      Weight:      Height:       General: {Exam; general:21111117} Lochia: {Desc; appropriate/inappropriate:30686::"appropriate"} Uterine Fundus: {Desc; firm/soft:30687} Incision: {Exam; incision:21111123} DVT Evaluation: {Exam; dvt:2111122} Labs: Lab Results  Component Value Date   WBC 6.6 12/28/2023   HGB 12.3 12/28/2023   HCT 38.8 12/28/2023   MCV 83.1 12/28/2023   PLT 243 12/28/2023      Latest Ref Rng & Units 12/28/2023   12:57 PM  CMP  Glucose 70 - 99 mg/dL 73   BUN 6 - 20 mg/dL 8   Creatinine 2.37 - 6.28 mg/dL 3.15   Sodium 176 - 160 mmol/L 134   Potassium 3.5 - 5.1 mmol/L 3.5   Chloride 98 - 111 mmol/L 104   CO2 22 - 32 mmol/L 19   Calcium 8.9 - 10.3 mg/dL 9.0   Total Protein 6.5 - 8.1 g/dL 7.3   Total Bilirubin 0.0 - 1.2 mg/dL 0.3   Alkaline Phos 38 - 126 U/L 1,094   AST 15 - 41 U/L 17   ALT 0 - 44 U/L 12    Edinburgh Score:    10/01/2021    9:56 PM  Edinburgh Postnatal Depression Scale Screening Tool  I have been able to laugh and see the funny side of things. 0  I have looked forward with enjoyment to things. 0  I have blamed myself unnecessarily when things went wrong. 1  I have been anxious or worried for no good reason. 2  I have felt scared or panicky for no good reason. 0  Things have been getting on top of me. 1  I have been so unhappy that I have had difficulty sleeping. 1  I have felt sad or miserable. 2  I have been so unhappy that I have been crying. 0  The thought of harming myself has occurred to me. 0  Edinburgh Postnatal Depression Scale Total 7   No data recorded  After visit meds:  Allergies as of 12/29/2023       Reactions   Bee Venom Anaphylaxis   Other Anaphylaxis   All nuts   Tree  Extract Anaphylaxis   Gramineae Pollens Rash     Med Rec must be completed prior to using this SMARTLINK***        Discharge home in stable condition Infant Feeding: {Baby feeding:23562} Infant Disposition:{CHL IP OB HOME WITH UJWJXB:14782} Discharge instruction: per After Visit Summary and Postpartum booklet. Activity: Advance as tolerated. Pelvic rest for 6 weeks.  Diet: {OB NFAO:13086578} Future Appointments:No future appointments. Follow up Visit:Message sent to [facility] on 12/29/2023   Please schedule this patient for a In person postpartum visit in 4 weeks with the following provider: MD. Additional Postpartum F/U:None  High risk pregnancy complicated by: H/O C/S, H/O Myomectomy, Obesity Delivery mode:  VBAC, Spontaneous Anticipated Birth Control:  Plans Interval BTL   12/29/2023 [name], CNM

## 2023-12-29 NOTE — Lactation Note (Signed)
 This note was copied from a baby's chart. Lactation Consultation Note  Patient Name: Sandra Krueger [id number] Date: 12/29/2023 Age:38 hours Reason for consult: Initial assessment;Term MOB attempted to latch infant on her right breast using the football hold, infant was to sleepy to breastfeed at this time. MOB was taught hand expression LC used breast model and MOB expressed 3 mls of colostrum that was spoon fed to infant. MOB will continue to breastfeed infant by cues , on demand, every 2-3 hours, skin to skin. MOB will continue to ask for latch assistance if needed. MOB knows she can hand expression and give infant her EBM as she continue to work on infant leaning how to latch at the breast. MOB is experienced with breastfeeding see maternal data below. LC discussed importance of maternal rest, meals and hydration. MOB was made aware of O/P services, breastfeeding support groups, community resources, and our phone # for post-discharge questions.    Maternal Data Has patient been taught Hand Expression?: Yes Does the patient have breastfeeding experience prior to this delivery?: Yes How long did the patient breastfeed?: Per MOB, she BF 1st child for 8 months, 2nd child 7 months and third child who is currently 2 years for 9 months.  Feeding Mother's Current Feeding Choice: Breast Milk  LATCH Score Latch: Too sleepy or reluctant, no latch achieved, no sucking elicited.  Audible Swallowing: None  Type of Nipple: Everted at rest and after stimulation  Comfort (Breast/Nipple): Soft / non-tender  Hold (Positioning): Assistance needed to correctly position infant at breast and maintain latch.  LATCH Score: 5   Lactation Tools Discussed/Used    Interventions Interventions: Breast feeding basics reviewed;Assisted with latch;Skin to skin;Breast compression;Adjust position;Support pillows;Position options;Expressed milk;Hand express;Education;Guidelines for Milk Supply and Pumping Schedule  Handout;LC Services brochure  Discharge Pump: DEBP;Personal  Consult Status Consult Status: Follow-up Date: 12/29/23 Follow-up type: In-patient    [name] 12/29/2023, 2:45 AM

## 2023-12-29 NOTE — Lactation Note (Signed)
 This note was copied from a baby's chart. Lactation Consultation Note  Patient Name: Sandra Krueger [id number] Date: 12/29/2023 Age:38 hours Reason for consult: Follow-up assessment;Difficult latch;Term Per MOB, infant has been poor feeder at breast and using the bottle. MOB attempted to latch infant at the breast, infant only held nipple in her mouth did not elicit the suck and swallow response. Per MOB she was having pain when using the DEBP only attempted pump once, LC re-fitted MOB with 27 mm breast flange which she thought felt better, having a lot of cramping when pumping, LC explained this is normal and to ask for pain meds, LC informed RN of MOB pain. LC attempted assist with formula feeding infant, infant does not do well with yellow slow flow or White [place]fant nipple, infant gags, tongue thrust and does not form a seal around the nipple, infant did not do well with spoon fed or finger feeding.  Infant had very low volume of intake of 2 mls ,  infant consumed 6 mls of 20 kcal formula with LC using the White [place] nipple , infant had difficulty with the feeding and it took 15 minutes to bottle feed infant.   Current feeding plan: 1- MOB will continue to attempt to latch infant to breast, by cues, on demand, every 2-3 hours. 2- MOB knows to ask for continued latch assistance if needed. 3- Due infant been poor feeder, MOB will continue to supplement infant with any EBM that is pumped and formula using the [place] nipple. 4- MOB referred for SLP consult. 5- MOB will continue to use the DEBP every 3 hours to help stimulate and establish her milk supply, MOB will spoon or finger feed infant her EBM.    Maternal Data    Feeding Mother's Current Feeding Choice: Breast Milk and Formula Nipple Type: Slow - flow  LATCH Score Latch: Too sleepy or reluctant, no latch achieved, no sucking elicited.  Audible Swallowing: None  Type of Nipple: Everted at rest and after stimulation  Comfort  (Breast/Nipple): Soft / non-tender  Hold (Positioning): Assistance needed to correctly position infant at breast and maintain latch.  LATCH Score: 5   Lactation Tools Discussed/Used Tools: Pump;Flanges Flange Size: 27 (LC re-fitted flange size to 27 mm) Breast pump type: Double-Electric Breast Pump Pump Education: Setup, frequency, and cleaning;Milk Storage Reason for Pumping: Infant is poor feeder and MOB is supplementing with formula. Pumping frequency: MOB was pumping when LC left the room.  Interventions Interventions: Assisted with latch;Skin to skin;Breast compression;Adjust position;Support pillows;Position options;DEBP;Education;Pace feeding  Discharge Pump: DEBP (NT set up)  Consult Status Consult Status: Follow-up Date: 12/30/23 Follow-up type: In-patient    [name] 12/29/2023, 4:42 PM

## 2023-12-30 MED ORDER — SENNOSIDES-DOCUSATE SODIUM 8.6-50 MG PO TABS: 2.0000 | ORAL_TABLET | Freq: Every evening | ORAL | 1 refills | Status: AC | PRN

## 2023-12-30 MED ORDER — FERROUS SULFATE 325 (65 FE) MG PO TABS: 325.0000 mg | ORAL_TABLET | ORAL | 0 refills | Status: AC

## 2023-12-30 MED ORDER — IBUPROFEN 800 MG PO TABS: 800.0000 mg | ORAL_TABLET | Freq: Three times a day (TID) | ORAL | 1 refills | Status: AC | PRN

## 2023-12-30 NOTE — Lactation Note (Signed)
 This note was copied from a baby's chart. Lactation Consultation Note  Patient Name: Sandra Krueger RUEAV'W Date: 12/30/2023 Age:38 hours Reason for consult: Follow-up assessment  P4, Mother states she is waiting until her milk comes in to breastfeed or pump and is formula feeding for now. Mother states her breasts are leaking. Reviewed supply and demand. Reviewed engorgement care and monitoring voids/stools. Suggest calling for help as needed.   Maternal Data Does the patient have breastfeeding experience prior to this delivery?: Yes  Feeding Mother's Current Feeding Choice: Breast Milk and Formula Nipple Type: Slow - flow  Lactation Tools Discussed/Used Breast pump type: Double-Electric Breast Pump;Manual  Interventions Interventions: Education  Discharge Discharge Education: Engorgement and breast care;Warning signs for feeding baby Pump: DEBP;Personal  Consult Status Consult Status: Complete Date: 12/30/23   Luellen Sages  RN, IBCLC 12/30/2023, 10:37 AM

## 2023-12-30 NOTE — Discharge Instructions (Signed)

## 2024-01-09 ENCOUNTER — Telehealth (HOSPITAL_COMMUNITY): Payer: Self-pay | Admitting: *Deleted

## 2024-01-09 DIAGNOSIS — Z1331 Encounter for screening for depression: Secondary | ICD-10-CM

## 2024-01-09 NOTE — Telephone Encounter (Signed)
 01/09/2024  Name: Sandra Krueger MRN: [id number] DOB: Feb 11, 1986  Reason for Call:  Transition of Care Hospital Discharge Call  Contact Status: Patient Contact Status: Complete  Language assistant needed: Interpreter Mode: Interpreter Not Needed        Follow-Up Questions:    Edinburgh Postnatal Depression Scale:  In the Past 7 Days: I have been able to laugh and see the funny side of things.: Not quite so much now I have looked forward with enjoyment to things.: Rather less than I used to I have blamed myself unnecessarily when things went wrong.: No, never I have been anxious or worried for no good reason.: No, not at all I have felt scared or panicky for no good reason.: No, not at all Things have been getting on top of me.: Yes, sometimes I haven't been coping as well as usual I have been so unhappy that I have had difficulty sleeping.: Yes, most of the time I have felt sad or miserable.: Yes, quite often I have been so unhappy that I have been crying.: Yes, most of the time The thought of harming myself has occurred to me.: Never Edinburgh Postnatal Depression Scale Total: (!) 12  PHQ2-9 Depression Scale:     Discharge Follow-up: Edinburgh score requires follow up?: Yes Provider notified of Edinburgh score?: Yes Have you already been referred for a counseling appointment?: No Patient was advised of the following resources:: Support Group, Breastfeeding Support Group, Lactation Patient referred to:: OP Lactation  Post-discharge interventions: Reviewed Newborn Safe Sleep Practices Maternal Mental Health Resources provided Placed [facility] referral in patient's chart  Pearlie Bougie, RN 01/09/2024 17:17

## 2024-02-05 ENCOUNTER — Ambulatory Visit: Admitting: Clinical

## 2024-02-05 ENCOUNTER — Ambulatory Visit: Admitting: Nurse Practitioner

## 2024-02-05 ENCOUNTER — Telehealth: Payer: Self-pay | Admitting: Lactation Services

## 2024-02-05 DIAGNOSIS — F4322 Adjustment disorder with anxiety: Secondary | ICD-10-CM | POA: Diagnosis not present

## 2024-02-05 NOTE — BH Specialist Note (Signed)
 Integrated Behavioral Health via Telemedicine Visit  02/05/2024 Sandra Krueger [id number]  Number of Integrated Behavioral Health Clinician visits: 2- Second Visit  Session Start time: 1316   Session End time: 1339  Total time in minutes: 23  Referring Provider: Jorene Moats, PA-C Patient/Family location: Home [facility] Provider location: Center for [facility] Healthcare at [facility] for Women  All persons participating in visit: Patient Sandra Krueger and [facility] Kwame Ryland   Types of Service: Individual psychotherapy and Video visit  I connected with Woodie LITTIE Brought and/or Malavika L Gable's n/a via  Telephone or Engineer, civil (consulting)  (Video is Surveyor, mining) and verified that I am speaking with the correct person using two identifiers. Discussed confidentiality: Yes   I discussed the limitations of telemedicine and the availability of in person appointments.  Discussed there is a possibility of technology failure and discussed alternative modes of communication if that failure occurs.  I discussed that engaging in this telemedicine visit, they consent to the provision of behavioral healthcare and the services will be billed under their insurance.  Patient and/or legal guardian expressed understanding and consented to Telemedicine visit: Yes   Presenting Concerns: Patient and/or family reports the following symptoms/concerns: Poor sleep quality, poor appetite, fatigue, irritability, poor concentration, anxiety; pt is requesting lactation support and for postpartum visit with medical provider as soon as possible for birth control.  Duration of problem: Postpartum; Severity of problem: moderate  Patient and/or Family's Strengths/Protective Factors: Concrete supports in place (healthy food, safe environments, etc.) and Sense of purpose  Goals Addressed: Patient will:  Reduce symptoms of: anxiety and stress   Increase knowledge and/or ability of: healthy  habits   Demonstrate ability to: Increase healthy adjustment to current life circumstances  Progress towards Goals: Ongoing    Interventions: Interventions utilized:  Solution-Focused Strategies, Link to [employer], and Supportive Reflection Standardized Assessments completed: GAD-7 and PHQ 9    Patient and/or Family Response: Patient agrees with treatment plan.   Clinical Assessment/Diagnosis  Adjustment disorder with anxiety   Patient may benefit from psychoeducation and brief therapeutic interventions regarding coping with symptoms of anxiety and life stress .  Plan: Follow up with behavioral health clinician on : Three weeks Behavioral recommendations:  -Consider continuing to take iron pill as prescribed -Consider prioritizing healthy self-care (regular meals, adequate rest; allowing practical help from supportive friends and family)  -Consider new mom support group as needed at either [email or web address] or [email or web address]  -Accept referral to Lactation; expect a call from lactation today Referral(s): Integrated Art gallery manager (In Clinic) and [employer]:  new mom support  I discussed the assessment and treatment plan with the patient and/or parent/guardian. They were provided an opportunity to ask questions and all were answered. They agreed with the plan and demonstrated an understanding of the instructions.   They were advised to call back or seek an in-person evaluation if the symptoms worsen or if the condition fails to improve as anticipated.  Sandra Krueger Sandra Kienle, LCSW     02/05/2024    1:30 PM 09/27/2021    9:59 AM 09/21/2021    4:54 PM 09/16/2021   10:33 AM 08/30/2021   10:35 AM  Depression screen PHQ 2/9  Decreased Interest 1 2 2 2 1   Down, Depressed, Hopeless 0 1 1 1 1   PHQ - 2 Score 1 3 3 3 2   Altered sleeping 3 3 3 3 2   Tired, decreased energy 3 2 3 2 2   Change in  appetite 3 1 0 0 0  Feeling bad or failure about yourself   0 0 0 1 0  Trouble concentrating 3 0 0 0 0  Moving slowly or fidgety/restless 0 0 0 0 0  Suicidal thoughts 0 0 0 0 0  PHQ-9 Score 13 9 9 9 6   Difficult doing work/chores     Not difficult at all      02/05/2024    1:33 PM 09/27/2021    9:59 AM 09/21/2021    4:54 PM 08/30/2021   10:36 AM  GAD 7 : Generalized Anxiety Score  Nervous, Anxious, on Edge 3 3 1 2   Control/stop worrying 0 2 1 1   Worry too much - different things 3 2 1 1   Trouble relaxing 0 2 1 1   Restless 1 0 0 0  Easily annoyed or irritable 3 3 3 2   Afraid - awful might happen 0 0 1 0  Total GAD 7 Score 10 12 8 7   Anxiety Difficulty  Not difficult at all  Not difficult at all

## 2024-02-05 NOTE — Telephone Encounter (Signed)
 Calling Sandra Krueger to offer outpatient lactation support services per LCSW request.  Sandra Krueger requests a call back later stating in a training at the moment. Follow up at a later time. Sandra Krueger IBCLC

## 2024-02-05 NOTE — Patient Instructions (Signed)
 Center for [facility] Healthcare at [facility] for Women [address] [place], [place] [address] [phone] (main office) [phone] [facility] office)  New Parent Support Groups [email or web address]

## 2024-02-05 NOTE — Progress Notes (Signed)
 Patient did not keep appointment.

## 2024-02-06 ENCOUNTER — Telehealth: Payer: Self-pay | Admitting: Lactation Services

## 2024-02-06 NOTE — Telephone Encounter (Addendum)
 Calling to offer out patient lactation support options, parent states would like to meet virtually tomorrow at 3:30 pm. Follow up at that time.  Vermell Pelt IBCLC

## 2024-02-07 ENCOUNTER — Telehealth

## 2024-02-11 ENCOUNTER — Other Ambulatory Visit: Payer: Self-pay

## 2024-02-11 ENCOUNTER — Ambulatory Visit: Admitting: Obstetrics & Gynecology

## 2024-02-11 ENCOUNTER — Encounter: Payer: Self-pay | Admitting: Obstetrics & Gynecology

## 2024-02-11 MED ORDER — NORETHINDRONE 0.35 MG PO TABS: 1.0000 | ORAL_TABLET | Freq: Every day | ORAL | 11 refills | Status: AC

## 2024-02-11 NOTE — Progress Notes (Signed)
 Post Partum Visit Note  Sandra Krueger is a 38 y.o. [id number] female who presents for a postpartum visit. She is 6 weeks postpartum following a normal spontaneous vaginal delivery.  I have fully reviewed the prenatal and intrapartum course. The delivery was at [redacted]w[redacted]d gestational weeks.  Anesthesia: none. Postpartum course has been uncomplicated. Baby is doing well, having some problems latching due to tongue issues, she is working with lactation. Baby is feeding by both breast and bottle - Enfamil Gentle-ease. Bleeding no bleeding. Bowel function is normal. Bladder function is normal. Patient is sexually active.  Desired contraception method is oral progesterone-only contraceptive. Postpartum depression screening: negative.   Upstream - 02/11/24 0900       Pregnancy Intention Screening   Does the patient want to become pregnant in the next year? No    Does the patient's partner want to become pregnant in the next year? No    Would the patient like to discuss contraceptive options today? No      Contraception Wrap Up   Current Method No Contraceptive Precautions    End Method No Contraception Precautions    Contraception Counseling Provided No    How was the end contraceptive method provided? N/A            02/11/2024    9:10 AM 01/09/2024    5:05 PM 12/30/2023    8:21 AM 12/29/2023   11:00 AM 12/29/2023    8:20 AM  Edinburgh Postnatal Depression Scale Screening Tool  I have been able to laugh and see the funny side of things. 1 1 0 -- --  I have looked forward with enjoyment to things. 1 1 0    I have blamed myself unnecessarily when things went wrong. 2 0 2    I have been anxious or worried for no good reason. 2 0 3    I have felt scared or panicky for no good reason. 1 0 1    Things have been getting on top of me. 2 2 1     I have been so unhappy that I have had difficulty sleeping. 1 3 0    I have felt sad or miserable. 1 2 1     I have been so unhappy that I have been crying. 1 3  1     The thought of harming myself has occurred to me. 0 0 0    Edinburgh Postnatal Depression Scale Total 12 12  9         Data saved with a previous flowsheet row definition     The pregnancy intention screening data noted above was reviewed. Potential methods of contraception were discussed. The patient elected to proceed with No Contraception Precautions.    Health Maintenance Due  Topic Date Due   Meningococcal B Vaccine (1 of 4 - Increased Risk) Never done   Pneumococcal Vaccine 94-51 Years old (1 of 2 - PCV) Never done   Hepatitis B Vaccines (1 of 3 - 19+ 3-dose series) Never done   HPV VACCINES (1 - 3-dose SCDM series) Never done   COVID-19 Vaccine (1 - 2024-25 season) Never done    The following portions of the patient's history were reviewed and updated as appropriate: allergies, current medications, past family history, past medical history, past social history, past surgical history, and problem list.  Review of Systems Pertinent items noted in HPI and remainder of comprehensive ROS otherwise negative.  Objective:  BP 114/83   Pulse 62  Wt 247 lb (112 kg)   LMP  (LMP Unknown)   Breastfeeding Yes   BMI 42.40 kg/m    General:  alert and no distress   Breasts:  not indicated  Lungs: clear to auscultation bilaterally  Heart:  regular rate and rhythm  Abdomen: Nontender, nondistended   GU exam:  not indicated       Assessment:   Normal postpartum exam.   Plan:   Essential components of care per ACOG recommendations:  1.  Mood and well being: Patient with positive depression screening today. Reviewed local resources for support. Already being seen by [facility] clinician, declines further intervention. - Patient tobacco use? No.   - hx of drug use? No.    2. Infant care and feeding:  -Patient currently breastmilk feeding? Yes. Reviewed importance of draining breast regularly to support lactation.  -Social determinants of health (SDOH) reviewed in EPIC. No  concerns.  3. Sexuality, contraception and birth spacing - Patient does not want a pregnancy in the next year.  Desired family size is 4 children.  - Reviewed reproductive life planning. Reviewed contraceptive methods based on pt preferences and effectiveness.  Patient desired Oral Contraceptive today, will consider tubal sterilization later. - Discussed birth spacing of 18 months  4. Sleep and fatigue -Encouraged family/partner/community support of 4 hrs of uninterrupted sleep to help with mood and fatigue  5. Physical Recovery  - Discussed patients delivery and complications. She describes her labor as good. - Patient had a Vaginal, abruption noted at delivery. Patient had no laceration. Perineal healing reviewed. Patient expressed understanding - Patient has urinary incontinence? No. - Patient is safe to resume physical and sexual activity  6.  Health Maintenance - HM due items addressed Yes - Last pap smear  Diagnosis  Date Value Ref Range Status  06/14/2023   Final   - Negative for intraepithelial lesion or malignancy (NILM)   Pap smear not done at today's visit.  -Breast Cancer screening indicated? No.     Thong Feeny, MD Center for [facility] Healthcare, [facility]

## 2024-02-26 NOTE — BH Specialist Note (Unsigned)
 Pt did not arrive to video visit and did not answer the phone; Left HIPPA-compliant message to call back Carolyn Cisco from [employer] for [employer] at [facility] for [facility] at  [phone] [facility] office).  ?; left MyChart message for patient.  ? ?

## 2024-02-27 ENCOUNTER — Ambulatory Visit: Admitting: Clinical

## 2024-02-27 DIAGNOSIS — Z91199 Patient's noncompliance with other medical treatment and regimen due to unspecified reason: Secondary | ICD-10-CM

## 2024-03-14 ENCOUNTER — Ambulatory Visit: Admitting: Obstetrics and Gynecology

## 2024-09-12 ENCOUNTER — Emergency Department (HOSPITAL_COMMUNITY)
Admission: EM | Admit: 2024-09-12 | Discharge: 2024-09-12 | Disposition: A | Discharge status: Home / Self Care | Attending: Emergency Medicine | Admitting: Emergency Medicine

## 2024-09-12 ENCOUNTER — Encounter (HOSPITAL_COMMUNITY): Payer: Self-pay

## 2024-09-12 ENCOUNTER — Other Ambulatory Visit: Payer: Self-pay

## 2024-09-12 DIAGNOSIS — J069 Acute upper respiratory infection, unspecified: Secondary | ICD-10-CM | POA: Insufficient documentation

## 2024-09-12 DIAGNOSIS — R059 Cough, unspecified: Secondary | ICD-10-CM | POA: Diagnosis present

## 2024-09-12 DIAGNOSIS — J4521 Mild intermittent asthma with (acute) exacerbation: Secondary | ICD-10-CM | POA: Diagnosis not present

## 2024-09-12 LAB — RESP PANEL BY RT-PCR (RSV, FLU A&B, COVID)  RVPGX2
Influenza A by PCR: NEGATIVE
Influenza B by PCR: NEGATIVE
Resp Syncytial Virus by PCR: NEGATIVE
SARS Coronavirus 2 by RT PCR: NEGATIVE

## 2024-09-12 MED ORDER — PREDNISONE 20 MG PO TABS
60.0000 mg | ORAL_TABLET | Freq: Once | ORAL | Status: AC
  Administered 2024-09-12: 60 mg via ORAL
  Filled 2024-09-12: qty 3

## 2024-09-12 MED ORDER — ALBUTEROL SULFATE HFA 108 (90 BASE) MCG/ACT IN AERS
2.0000 | INHALATION_SPRAY | Freq: Once | RESPIRATORY_TRACT | Status: AC
  Administered 2024-09-12: 2 via RESPIRATORY_TRACT
  Filled 2024-09-12: qty 6.7

## 2024-09-12 MED ORDER — PREDNISONE 10 MG PO TABS: 50.0000 mg | ORAL_TABLET | Freq: Every day | ORAL | 0 refills | Status: AC

## 2024-09-12 NOTE — ED Triage Notes (Signed)
 Pt is coming from peds with child, child was seen in peds and tested positive for RSV, mother I coming over wanting to get checked too and states that she has asthma and it has been flaring up some. Pt is stable in triage with some coughing, no active distress at this time.

## 2024-09-12 NOTE — ED Provider Notes (Signed)
 " [place] EMERGENCY DEPARTMENT AT [facility] Provider Note   CSN: [id number] Arrival date & time: 09/12/24  9294     Patient presents with: Cough   Sandra Krueger is a 39 y.o. female.   HPI   39 y/o patient with pertinent history of asthma comes to the ER with complains of congestion, post nasal drip, chest tightness, wheezing and dry cough for 1 days. She  has a history of asthma.  Her daughter was brought to the ER last night and has RSV.  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   Prior to Admission medications  Medication Sig Start Date End Date Taking? Authorizing Provider  predniSONE  (DELTASONE ) 10 MG tablet Take 5 tablets (50 mg total) by mouth daily. 09/12/24  Yes Charlyn Sora, MD  acetaminophen  (TYLENOL ) 325 MG tablet Take 2 tablets (650 mg total) by mouth every 6 (six) hours as needed for mild pain (pain score 1-3). 11/22/23   Cooleen, Olam LABOR, NP  acetaminophen  (TYLENOL ) 500 MG tablet Take 2 tablets (1,000 mg total) by mouth every 6 (six) hours. 10/02/21   Virgilio Payor, MD  albuterol  (VENTOLIN  HFA) 108 [phone] Base) MCG/ACT inhaler Inhale 2 puffs into the lungs every 6 (six) hours as needed for wheezing or shortness of breath. 08/13/23   Leftwich-Kirby, Olam LABOR, CNM  Blood Pressure Monitoring (BLOOD PRESSURE KIT) DEVI 1 Device by Does not apply route as needed. 06/07/23   Fredirick Glenys RAMAN, MD  ferrous sulfate  325 (65 FE) MG tablet Take 1 tablet (325 mg total) by mouth every other day. Patient not taking: Reported on 02/11/2024 12/30/23   Jhonny Augustin BROCKS, MD  hyoscyamine  (LEVSIN AMIEL) 0.125 MG SL tablet Place 1 tablet (0.125 mg total) under the tongue every 6 (six) hours as needed for cramping. Patient not taking: Reported on 02/11/2024 07/04/23   Trudy Earnie LITTIE, CNM  ibuprofen  (ADVIL ) 800 MG tablet Take 1 tablet (800 mg total) by mouth every 8 (eight) hours as needed. Patient not taking: Reported on 02/11/2024 12/30/23   Jhonny Augustin BROCKS, MD  Misc. Devices (GOJJI WEIGHT SCALE) MISC 1 Device by Does not apply route once a week. Patient not taking: Reported on 02/11/2024 06/07/23   Fredirick Glenys RAMAN, MD  norethindrone  (MICRONOR ) 0.35 MG tablet Take 1 tablet (0.35 mg total) by mouth daily. 02/11/24   Anyanwu, Ugonna A, MD  ondansetron  (ZOFRAN -ODT) 4 MG disintegrating tablet Take 1 tablet (4 mg total) by mouth every 6 (six) hours as needed for nausea. Patient not taking: Reported on 02/11/2024 07/16/23   Milly Olam LABOR, CNM  Prenatal Vit-Fe Fumarate-FA (PRENATAL VITAMINS PO) Take 1 tablet by mouth daily at 6 (six) AM.    [provider]  senna-docusate (SENOKOT-S) 8.6-50 MG tablet Take 2 tablets by mouth at bedtime as needed for mild constipation. Patient not taking: Reported on 02/11/2024 12/30/23   Jhonny Augustin BROCKS, MD  simethicone  [facility]) 80 MG chewable tablet Chew 1 tablet (80 mg total) by mouth every 6 (six) hours as needed. Patient not taking: Reported on 02/11/2024 07/04/23   Trudy Earnie LITTIE, CNM    Allergies: Bee venom, Other, Tree extract, and Gramineae pollens    Review of Systems  Updated Vital Signs BP 109/67 (BP Location: Right Arm)   Pulse 95   Temp 97.7 F (36.5 C)   Resp 19   SpO2 97%   Physical Exam Vitals and nursing note reviewed.  Constitutional:  Appearance: She is well-developed.  HENT:     Head: Atraumatic.     Nose: No congestion.  Cardiovascular:     Rate and Rhythm: Normal rate.  Pulmonary:     Effort: Pulmonary effort is normal.  Musculoskeletal:     Cervical back: Neck supple.  Skin:    General: Skin is warm and dry.  Neurological:     Mental Status: She is alert and oriented to person, place, and time.     (all labs ordered are listed, but only abnormal results are displayed) Labs Reviewed  RESP PANEL BY RT-PCR (RSV, FLU A&B, COVID)  [id number]    EKG: None  Radiology: No results found.   Procedures   Medications Ordered in the ED  predniSONE   (DELTASONE ) tablet 60 mg (has no administration in time range)  albuterol  (VENTOLIN  HFA) 108 (90 Base) MCG/ACT inhaler 2 puff (has no administration in time range)                                    Medical Decision Making Risk Prescription drug management.    OBJECTIVE: Patient appears well, non toxic, no respiratory distress, vital signs are as noted.  Patient sounds congested  Differential diagnosis considered for this patient includes: Viral syndrome including RSV, influenza Asthma exacerbation   PLAN: Symptomatic therapy suggested: push fluids, rest, and return if symptoms persist or worsen. Lack of antibiotic effectiveness discussed with her.   Final diagnoses:  Mild intermittent asthma with exacerbation  Viral upper respiratory tract infection    ED Discharge Orders          Ordered    predniSONE  (DELTASONE ) 10 MG tablet  Daily        09/12/24 0913               Charlyn Sora, MD 09/12/24 [phone]  "
